# Patient Record
Sex: Male | Born: 1935 | Race: White | Hispanic: No | Marital: Married | Smoking: Never smoker
Health system: Southern US, Community
[De-identification: ages and names within clinical notes are randomized; demographics above are authoritative.]

## PROBLEM LIST (undated history)

## (undated) DIAGNOSIS — R112 Nausea with vomiting, unspecified: Secondary | ICD-10-CM

## (undated) DIAGNOSIS — R001 Bradycardia, unspecified: Secondary | ICD-10-CM

## (undated) DIAGNOSIS — I219 Acute myocardial infarction, unspecified: Secondary | ICD-10-CM

## (undated) DIAGNOSIS — I2510 Atherosclerotic heart disease of native coronary artery without angina pectoris: Secondary | ICD-10-CM

## (undated) DIAGNOSIS — Z9889 Other specified postprocedural states: Secondary | ICD-10-CM

## (undated) DIAGNOSIS — Z9289 Personal history of other medical treatment: Secondary | ICD-10-CM

## (undated) DIAGNOSIS — I10 Essential (primary) hypertension: Secondary | ICD-10-CM

## (undated) DIAGNOSIS — Z87442 Personal history of urinary calculi: Secondary | ICD-10-CM

## (undated) DIAGNOSIS — I441 Atrioventricular block, second degree: Secondary | ICD-10-CM

## (undated) DIAGNOSIS — K219 Gastro-esophageal reflux disease without esophagitis: Secondary | ICD-10-CM

## (undated) DIAGNOSIS — G4730 Sleep apnea, unspecified: Secondary | ICD-10-CM

## (undated) DIAGNOSIS — H3530 Unspecified macular degeneration: Secondary | ICD-10-CM

## (undated) DIAGNOSIS — M1990 Unspecified osteoarthritis, unspecified site: Secondary | ICD-10-CM

## (undated) HISTORY — DX: Personal history of other medical treatment: Z92.89

## (undated) HISTORY — PX: OTHER SURGICAL HISTORY: SHX169

## (undated) HISTORY — PX: EYE SURGERY: SHX253

## (undated) HISTORY — PX: CORONARY ANGIOPLASTY: SHX604

## (undated) HISTORY — PX: KNEE SURGERY: SHX244

---

## 1998-05-18 DIAGNOSIS — I219 Acute myocardial infarction, unspecified: Secondary | ICD-10-CM

## 1998-05-18 HISTORY — DX: Acute myocardial infarction, unspecified: I21.9

## 1998-06-14 ENCOUNTER — Encounter: Payer: Self-pay | Admitting: Obstetrics and Gynecology

## 1998-06-14 ENCOUNTER — Emergency Department (HOSPITAL_COMMUNITY): Admission: EM | Admit: 1998-06-14 | Discharge: 1998-06-14 | Payer: Self-pay | Admitting: *Deleted

## 1999-02-17 ENCOUNTER — Ambulatory Visit (HOSPITAL_COMMUNITY): Admission: RE | Admit: 1999-02-17 | Discharge: 1999-02-18 | Payer: Self-pay | Admitting: Cardiology

## 1999-02-23 ENCOUNTER — Inpatient Hospital Stay (HOSPITAL_COMMUNITY): Admission: EM | Admit: 1999-02-23 | Discharge: 1999-02-25 | Payer: Self-pay | Admitting: Emergency Medicine

## 1999-02-23 ENCOUNTER — Encounter: Payer: Self-pay | Admitting: Cardiology

## 1999-03-05 ENCOUNTER — Encounter: Payer: Self-pay | Admitting: *Deleted

## 1999-03-06 ENCOUNTER — Inpatient Hospital Stay (HOSPITAL_COMMUNITY): Admission: EM | Admit: 1999-03-06 | Discharge: 1999-03-06 | Payer: Self-pay | Admitting: Emergency Medicine

## 1999-05-01 ENCOUNTER — Encounter: Payer: Self-pay | Admitting: Nephrology

## 1999-05-01 ENCOUNTER — Encounter: Admission: RE | Admit: 1999-05-01 | Discharge: 1999-05-01 | Payer: Self-pay | Admitting: Nephrology

## 1999-05-15 ENCOUNTER — Ambulatory Visit (HOSPITAL_COMMUNITY): Admission: RE | Admit: 1999-05-15 | Discharge: 1999-05-15 | Payer: Self-pay | Admitting: Cardiology

## 1999-08-12 ENCOUNTER — Ambulatory Visit (HOSPITAL_COMMUNITY): Admission: RE | Admit: 1999-08-12 | Discharge: 1999-08-12 | Payer: Self-pay | Admitting: Cardiology

## 2001-01-10 ENCOUNTER — Encounter: Payer: Self-pay | Admitting: Family Medicine

## 2001-01-10 ENCOUNTER — Ambulatory Visit (HOSPITAL_COMMUNITY): Admission: RE | Admit: 2001-01-10 | Discharge: 2001-01-10 | Payer: Self-pay | Admitting: Family Medicine

## 2001-04-07 ENCOUNTER — Ambulatory Visit (HOSPITAL_COMMUNITY): Admission: RE | Admit: 2001-04-07 | Discharge: 2001-04-07 | Payer: Self-pay | Admitting: Cardiology

## 2001-11-25 ENCOUNTER — Encounter: Payer: Self-pay | Admitting: Cardiology

## 2001-11-25 ENCOUNTER — Observation Stay (HOSPITAL_COMMUNITY): Admission: EM | Admit: 2001-11-25 | Discharge: 2001-11-25 | Payer: Self-pay | Admitting: Emergency Medicine

## 2004-10-06 ENCOUNTER — Emergency Department (HOSPITAL_COMMUNITY): Admission: EM | Admit: 2004-10-06 | Discharge: 2004-10-06 | Payer: Self-pay | Admitting: Emergency Medicine

## 2007-05-24 ENCOUNTER — Emergency Department (HOSPITAL_COMMUNITY): Admission: EM | Admit: 2007-05-24 | Discharge: 2007-05-24 | Payer: Self-pay | Admitting: Emergency Medicine

## 2007-10-14 ENCOUNTER — Emergency Department (HOSPITAL_COMMUNITY): Admission: EM | Admit: 2007-10-14 | Discharge: 2007-10-14 | Payer: Self-pay | Admitting: Emergency Medicine

## 2008-05-17 ENCOUNTER — Emergency Department (HOSPITAL_BASED_OUTPATIENT_CLINIC_OR_DEPARTMENT_OTHER): Admission: EM | Admit: 2008-05-17 | Discharge: 2008-05-17 | Payer: Self-pay | Admitting: Emergency Medicine

## 2009-04-29 ENCOUNTER — Inpatient Hospital Stay (HOSPITAL_COMMUNITY): Admission: EM | Admit: 2009-04-29 | Discharge: 2009-05-01 | Payer: Self-pay | Admitting: Emergency Medicine

## 2009-04-30 ENCOUNTER — Encounter (INDEPENDENT_AMBULATORY_CARE_PROVIDER_SITE_OTHER): Payer: Self-pay | Admitting: Internal Medicine

## 2009-04-30 ENCOUNTER — Ambulatory Visit: Payer: Self-pay | Admitting: Vascular Surgery

## 2010-08-19 LAB — DIFFERENTIAL
Lymphocytes Relative: 11 % — ABNORMAL LOW (ref 12–46)
Lymphs Abs: 0.8 10*3/uL (ref 0.7–4.0)
Monocytes Absolute: 0.3 10*3/uL (ref 0.1–1.0)
Monocytes Relative: 4 % (ref 3–12)
Neutro Abs: 6.2 10*3/uL (ref 1.7–7.7)
Neutrophils Relative %: 83 % — ABNORMAL HIGH (ref 43–77)

## 2010-08-19 LAB — COMPREHENSIVE METABOLIC PANEL
AST: 27 U/L (ref 0–37)
Alkaline Phosphatase: 75 U/L (ref 39–117)
CO2: 24 mEq/L (ref 19–32)
Calcium: 8.8 mg/dL (ref 8.4–10.5)
Chloride: 105 mEq/L (ref 96–112)
GFR calc Af Amer: >60 mL/min (ref >60)
Glucose, Bld: 118 mg/dL — ABNORMAL HIGH (ref 70–99)
Potassium: 3.6 mEq/L (ref 3.5–5.1)
Sodium: 137 mEq/L (ref 135–145)

## 2010-08-19 LAB — LIPID PANEL
Total CHOL/HDL Ratio: 3.1 RATIO
VLDL: 14 mg/dL (ref 0–40)

## 2010-08-19 LAB — GLUCOSE, CAPILLARY: Glucose-Capillary: 109 mg/dL — ABNORMAL HIGH (ref 70–99)

## 2010-08-19 LAB — BASIC METABOLIC PANEL
BUN: 16 mg/dL (ref 6–23)
CO2: 24 mEq/L (ref 19–32)
Calcium: 8.9 mg/dL (ref 8.4–10.5)
Creatinine, Ser: 1.49 mg/dL (ref 0.4–1.5)
GFR calc non Af Amer: 46 mL/min — ABNORMAL LOW (ref >60)
Glucose, Bld: 99 mg/dL (ref 70–99)
Potassium: 4 mEq/L (ref 3.5–5.1)

## 2010-08-19 LAB — CARDIAC PANEL(CRET KIN+CKTOT+MB+TROPI)
Relative Index: 1.7 (ref 0.0–2.5)
Relative Index: 1.7 (ref 0.0–2.5)
Total CK: 137 U/L (ref 7–232)
Total CK: 139 U/L (ref 7–232)
Troponin I: 0.01 ng/mL (ref 0.00–0.06)
Troponin I: 0.01 ng/mL (ref 0.00–0.06)
Troponin I: 0.02 ng/mL (ref 0.00–0.06)

## 2010-08-19 LAB — CBC
MCHC: 34.8 g/dL (ref 30.0–36.0)
MCV: 94.8 fL (ref 78.0–100.0)
RDW: 13.8 % (ref 11.5–15.5)

## 2010-08-19 LAB — PROTIME-INR
INR: 1 (ref 0.00–1.49)
Prothrombin Time: 13.1 seconds (ref 11.6–15.2)

## 2010-10-03 NOTE — Cardiovascular Report (Signed)
[place]. [facility]  Patient:    Bruce [name], Bruce [name] Visit Number: [id number] MRN: [id number]          Service Type: MED Location: 2000 2002 01 Attending Physician:  Corliss Marcus Dictated by:   Meade Maw, M.D. Admit Date:  11/25/2001 Discharge Date: 11/25/2001   CC:         [name]. [name], M.D.  [name], M.D.   Cardiac Catheterization  REFERRING PHYSICIAN:  [name]. [name], M.D.  BRIEF HISTORY:  The patient is a 75 year old gentleman who has known coronary artery disease status post PTCA with a stent deployed in the right coronary artery.  He presents to the emergency room with a history suggestive of unstable angina.  DESCRIPTION OF PROCEDURE:  After obtaining written informed consent and premedicating the patient with Mucomyst for mild renal insufficiency, the patient was brought to the cardiac catheterization lab in the postabsorptive state.  The right groin was prepped and draped in the usual sterile fashion. Local anesthesia was achieved using 1% Xylocaine.  A #6 French hemostasis sheath was placed into the right femoral artery using the modified Seldinger technique.  Selective coronary angiography was performed using a JL4 and a No-Torque right.  Selective engagement with a JR4 was made difficult by inadvertent engagement of the conus branch.  Left heart pressures were obtained using a #6 French pigtail curved catheter.  Ventriculogram was deferred secondary to mild elevation of the creatinine.  All catheter exchanges were made over a guidewire.  The hemostasis sheath was placed following each engagement.  FINDINGS:  HEMODYNAMIC DATA: 1. The aortic pressure was 126/78. 2. LV pressure was 126/19.  CORONARY ANGIOGRAPHY:  Fluoroscopy did not reveal any significant calcification. 1. Left main coronary artery:  The left main coronary artery bifurcates into    the left anterior descending and circumflex vessels.  The left main    coronary artery appears normal.  2. Left anterior descending:  The left anterior descending and its branches    were mildly diseased in the mid portion of the LAD.  This region has    previously had ultrasound.  There was a 30-40% tubular stenosis in the    origin of a large diagonal branch.  The ongoing LAD is narrow in caliber    and transverses the apex.  3. Circumflex vessel:  The circumflex vessel and its branches were without    significant obstruction.  There were two small marginal branches which    arise proximally and a true obtuse marginal branch without significant    obstruction.  4. The right coronary artery and its branches show that the mid vessel stented    segment is widely patent.  There is a 10-15% tubular stenosis throughout    the stent.  The ongoing right coronary artery is dominant and without    significant obstruction.  IMPRESSION: 1. Nonobstructive coronary artery disease in the left anterior descending. 2. Widely patent stent in the right coronary artery. 3. Normal left ventricular function.  The patient was transferred to the holding area.  Dr. Amil Amen will review the films prior to discontinuation of the sheath.Dictated by:   Meade Maw, M.D.  Attending Physician:  Corliss Marcus DD:  11/25/01 TD:  11/28/01 Job: [id number]

## 2010-10-03 NOTE — H&P (Signed)
[place]. [facility]  Patient:    Bruce [name], MCCAULEY Visit Number: [id number] MRN: [id number]          Service Type: MED Location: 2000 2002 01 Attending Physician:  Corliss Marcus Dictated by:   Anselm Lis, N.P. Admit Date:  11/25/2001 Discharge Date: 11/25/2001   CC:         Dellis Anes. Idell Pickles, M.D.   History and Physical  DATE OF BIRTH:  February 08, 1936  PRIMARY CARE [place] [facility]:  Dr. Dellis Anes. Heller.  IMPRESSION: 1. Chest tightness in this 75 year old male with a history of silent coronary    artery disease; single-vessel coronary atherosclerotic heart disease.  He    is status post stent to right coronary artery, October 2001.  Followup    heart catheterization, March 2002, which was done secondary to abnormal    Cardiolite, and cardiac catheterization, November 2002, (filling mandates    of Reversal Study) revealed patent stent; nonobstructive left anterior    descending disease.  The patient is currently pain-free.  Electrocardiogram    is without acute ischemic changes and without significant change from    baseline.  Question anginal equivalent versus gastroesophageal reflux    disease.  The patient did take some Tums which seemed to help with episodes    of tightness two days earlier. 2. Dyslipidemia. 3. Hypertension. 4. History of renal insufficiency.  PLAN: 1. Pending Dr. [name] review, admit to telemetry, serial cardiac    enzymes and daily EKG. 2. Continue medications as prior to admission with the addition of subcut.    Lovenox.  HISTORY OF PRESENT ILLNESS:  The patient is a very pleasant 75 year old gentleman with a history of silent coronary ischemia; single-vessel CAD.  He is status post stent to RCA, October 2001, with followup cardiac catheterization, March 2002, revealing patent stent (done as followup to abnormal Cardiolite).  He is status post diagnostic cardiac catheterization, November 2002, as mandated by Reversal  Study, which revealed widely patent stent, RCA; nonobstructive disease, LAD.  His EF is "borderline low-normal.)  History of dyslipidemia, history of renal insufficiency with baseline creatinine 1.6, hypertension and positive family history of CAD.  Two days prior to admission, the patient noted mild anterior chest pressure, episodic, lasting one to two minutes each time.  No associated symptoms.  He later took Tums which seemed to help.  He is not sure if these symptoms were associated with activity.  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  This morning, while he was at a local hardwood store, he had recurrence of same discomfort with associated "feeling as if he had to take a deeper breath."  No other symptoms.  Symptoms relieved with rest, lasted about 15 minutes total.  He reported to Dr. Dellis Anes. Hellers office and during that period, noted across-posterior-shoulder discomfort.  He has had no further chest pressure.  EKG revealed sinus bradycardia and "old" inferior MI; T wave inversions, lead III; no significant change from last EKG, March 2001. He was transferred via EMS to [facility], where he remained pain-free.  PREVIOUS MEDICAL HISTORY: 1. Coronary atherosclerotic heart disease.    A. (November 2002) Diagnostic cardiac catheterization for filling       requirements of Reversal Trial.  Patent RCA stent.  Nonobstructive LAD       disease.    B. (March 2002) Cardiac catheterization revealing patent stent; this       catheterization was followup of  an abnormal Cardiolite.  EF 52%.       Negative MR. Inferobasal akinesis.    C. (October 2001) PTCA/stent, RCA; followup of an abnormal screening       Cardiolite. 2. History of dyslipidemia, on Lipitor management by Dr. Amil Amen. 3. History of hypertension. 4. History of mild renal insufficiency with serum creatinine as high as 1.8,    October of 2000, as well as 1.4, March of 2001.  Lab work,  November 2002,    revealed creatinine 1.6, BUN of 19.  He has had workup by    Dr. Dyke Maes of [facility], 10-May-1999   including a 24-hour urine for protein which was negative.  Negative SPEP.    Renal ultrasound okay except for one small cyst, right kidney.  ALLERGIES:  To PLAVIX, causing as rash; ALDACTONE and ALDOMET caused a rash and pruritus.  SOCIAL HISTORY AND HABITS:  He has been married for 39 years to [place].  He has two daughters, Octavio Graves and Misty Stanley, ages approximately 25 and 21, respectively. Tobacco:  Negative.  ETOH:  Negative.  Caffeine:  Not excessive.  No use of illicit drugs.  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  He takes frequent mission trips and previously been to [place], [place] and [place].  FAMILY HISTORY:  Sister recently deceased of an MI at age 84 05-09-00). He has a 12 year old brother and a 37 year old brother, both with hypertension and heart disease.  Another 35 year old sister doing well.  All siblings have elevated cholesterol.  REVIEW OF SYSTEMS:  As in HPI/previous medical history, otherwise mentions he has lightheadedness after bending and standing up quickly, which he attributes to his Cardura.  Wears upper partials.  Episodic buzzing in his right ear.  He has had prior syncopal episode occurring I believe close to his initial stent back in 2001; etiology was a severe upper thigh pain that was so bad it caused him to pass out.  Episodically, he will get bilateral thigh discomfort, last episode about three weeks earlier, which is quite excruciating to him; he has not mentioned this to his primary care and etiology is unknown.  Denies melena nor bright red blood PR.  Negative constipation nor diarrhea.  Negative dysuria nor hematuria.  Denies arthritic-type complaints.  Negative pedal edema, DOE, orthopnea nor PND.  Occasional GERD, unlike presenting symptoms.  PHYSICAL EXAMINATION:  VITAL SIGNS:   Blood pressure 120/70, heart rate 68 and regular, respiratory  rate 18, temperature 97.6.  GENERAL:  He is a well-nourished gentleman appearing approximately his stated age.  He looks fatigued.  No acute distress and currently discomfort-free. His wife and daughter Misty Stanley are in attendance.  HEENT/NECK:  Brisk bilateral carotid upstrokes without bruit.  He does have a right JVD, which is not apparent on the left.  CHEST:  Left basilar crackles on inspiration, otherwise, clear.  CARDIAC:  Regular rate and rhythm without murmur, rub nor gallop.  Normal S1 and S2.  ABDOMEN:  Soft, nondistended, normoactive bowel sounds.  Negative abdominal aortic, renal or femoral bruit.  Nontender to applied pressure.  No masses nor organomegaly appreciated.  EXTREMITIES:  Distal pulses intact.  Negative pedal edema.  NEUROLOGIC:  Cranial nerves II-XII grossly intact, alert and oriented x3.  GENITAL:  Deferred.  RECTAL:  Deferred.  LABORATORY TESTS AND DATA:  WBC of 3.7, hemoglobin 13.4, hematocrit of 39.7, platelets of 141,000.  Sodium 139, potassium of 4.5, chloride of 107, CO2 26, glucose 101,  BUN of 22, creatinine 1.5, calcium 8.6.  LFTs all within normal range.  CK/MB, troponin I and coagulations are pending.Dictated by:   Anselm Lis, N.P. Attending Physician:  Corliss Marcus DD:  11/25/01 TD:  11/28/01 Job: [id number]

## 2010-10-03 NOTE — Cardiovascular Report (Signed)
[place]. [facility]  Patient:    Bruce [name], Bruce [name]                        MRN: [id number] Proc. Date: 08/12/99 Adm. Date:  [id number] Disc. Date: [id number] Attending:  Corliss Marcus CC:         [name]. [name], M.D.             Cardiac Catheterization Laboratory                        Cardiac Catheterization  CINE NUMBER:  [id number]  PROCEDURES PERFORMED: 1. Left heart catheterization. 2. Coronary angiography. 3. Left ventriculogram. 4. Intravascular ultrasound of left anterior descending artery (REVERSAL). 5. Percutaneous closure, right femoral artery.  INDICATIONS:  Mr. Bose is a 75 year old man with six months status post percutaneous revascularization of the right coronary which was completely occluded. He has done well in the interim.  A routine screening six month myocardial perfusion centrigraphy with exercise suggested some reversible ischemia as well as a primarily fixed defect in the inferior wall.  He did not have symptoms during the exercise portion and has not had at any time.  He has returned to the catheterization laboratory at this time to rule out the possibility of silent restenosis.  DESCRIPTION OF PROCEDURE:  The patient was brought to the cardiac catheterization laboratory in the post-absorptive state.  The right groin was prepped and draped in the usual sterile fashion.  Local anesthesia was obtained with the infiltration of 1% lidocaine.  A 5 French catheter sheath was inserted percutaneously into the right femoral artery utilizing an anterior approach over a guiding J wire.  A 110 cm pigtail catheter was advanced to the ascending aorta where the pressure as recorded.  The catheter was then prolapsed across the aortic valve and the pressure again recorded both prior to and following the ventriculogram.  A 30 degree RAO  cine left ventriculogram was performed utilizing a power injector.  Omnipaque, 45 cc, was injected at 13 cc  per second.  Following the sublingual administration of 0.4 mg nitroglycerin, cine angiography of each coronary artery was conducted in LAO and RAO projections using left and right 5 French #4 Judkins catheters.  The patient had previously consented to enrollment in the REVERSAL study. There was modest disease in the LAD.  Preparations were then made to proceed with intravascular ultrasound.  The 5 French catheter sheath was exchanged over a long guiding J wire for a 7 French catheter sheath.  The patient received 3000 units of heparin intravenously.  A 7 French FL-4 Sci-Med Wiseguide guiding catheter was advanced to the ascending aorta where the left coronary os was engaged.  A 0.014 inch ACS high torque floppy guide wire was advanced across the 30 to 40% lesion in the mid portion of the LAD and into a large diagonal branch.  A Sci-Med Ultra Cross intravascular ultrasound catheter was advanced into the mid to distal portion of the LAD.  Two ultrasound runs were obtained using a mechanical pullback device.  The patient did receive 0.2 mg of intracoronary nitroglycerin prior to initiation of this procedure.  At the completion, a repeat cine angiogram confirmed no significant changes seen in the LAD following the intravascular ultrasound.  The guiding catheter was removed over a long guiding J wire.  The catheter sheath was removed and the arteriotomy site closed percutaneously using the Perclose closure device.  Patient was then removed to the catheterization table and transported to the recovery area in stable condition with good distal pulses.  Fluoroscopy time was 9.2 minutes and total contrast utilized was 190 cc of Omnipaque.  HEMODYNAMICS:  Systemic arterial pressure was 137/78 with a mean of 102 mmHg. There was no systolic gradient across the aortic valve.  Left ventricular end diastolic pressure was 20 mmHg pre ventriculogram and unchanged  post ventriculogram.  ANGIOGRAPHY:  The left ventriculogram demonstrated a normal left ventricular chamber size and normal global systolic function.  There was inferobasilar akinesis seen.  The calculated ejection fraction utilizing a single-plane cine method was 52%.  There was no coronary calcification seen.  There was no mitral regurgitation.  There was a right dominant coronary system present.  The left main coronary artery was normal.  The left anterior descending artery and its branches were minimally diseased; there were two diagonal branches which arose, the second of which was quite large and  competed in dominance with the outgoing LAD.  Just after the origin of the diagonal branch in the LAD, there was a diffuse 30% stenosis.  In the proximal portion of the large diagonal branch, there was another 30% stenosis and, in the mid portion of the LAD, there was an eccentric 30 to 35% stenosis which extended approximately 15 mm.  The left circumflex artery and its branches were minimally diseased; this vessel really amounted to a single large true obtuse marginal branch.  There were three very small circumflex marginal branches prior to this.  No significant obstructions were seen within this vessel.  The right coronary artery and its branches were moderately diseased; the vessel was widely patent.  There is a "shepherds crook" deformity in the proximal segment nd this is the location of a 30% stenosis.  The stented segment is in the mid portion of the vessel and is widely patent.  There is perhaps 10% in-stent stenosis seen. The ongoing vessel is quite large.  There is a 20% eccentric stenosis in the distal segment.  It then gives rise to a large posterior descending artery and a very large posterolateral segment and branch.  No obstructions are seen within this portion of the vessel.  The intravascular ultrasound documented the presence of disease in the  mid portion of the LAD but the lumen was at no time significantly compromised.  There were superficial calcification seen.  FINAL IMPRESSION: 1. Arteriosclerotic cardiovascular disease, single vessel.  2. Intact global left ventricular size and systolic function with regional wall    motion abnormality as noted. 3. Nonobstructive atherosclerotic coronary vascular disease.  PLAN:  The patient is presented with this gratifying news.  The right coronary remains wide opened.  Unfortunately, there has not been a significant amount of  recovery of function of the basal portion of the inferior wall.  However, his overall LV systolic function is normal and this result gives him an excellent prognosis.  The patient has been enrolled in the REVERSAL study and will have his Lipitors discontinued for one month after which his lipid panel will be obtained and, if his LDL is higher than 125, he will be randomized either with Lipitor 80 mg p.o. q.  day, or Pravachol 40 mg p.o. q.d. for 18 months.  He will then return to the catheterization laboratory for a repeat angiography and intravascular ultrasound. DD:  08/12/99 TD:  08/13/99 Job: [id number]

## 2010-10-03 NOTE — Cardiovascular Report (Signed)
[place]. [facility]  Patient:    [name], [name] Visit Number: [id number] MRN: [id number]          Service Type: Attending:  [name], M.D. Dictated by:   [name], M.D. Proc. Date: 04/07/01   CC:         [name]. [name], M.D.  [facility]  Cardiac Catheterization Lab   Cardiac Catheterization  PROCEDURES PERFORMED. 1. Left heart catheterization. 2. Coronary angiography. 3. Left ventriculogram. 4. Intravascular ultrasound of left anterior descending artery (REVERSAL). 5. Percutaneous closure of right femoral artery.  SURGEON:  [name], M.D.  INDICATIONS:  [name] is a 75 year old man who now returns to the outpatient center for his 25-month followup angiogram as mandated by the REVERSAL study.  He has done well in the meantime with no recurrent of angina pectoris.  He remains vigorous and active.  As part of this study, he will undergo cardiac catheterization, coronary angiography, left ventriculogram, and intravascular ultrasound to the left anterior descending artery where moderate stenoses were noted at his previous study August 12, 1999.  PROCEDURE NOTE:  Left heart catheterization was performed following the percutaneous insertion of 7-French catheter sheath utilizing an anterior approach over a guiding J wire into the right femoral artery.  A 110 cm pigtail catheter was used to measure pressures within the ascending aorta and in the left ventricle both prior to and following the ventriculogram.  A 30-degree RAO cine left ventriculogram was performed utilizing the power injector.  Coronary angiography was then performed using 6-French #4 left and right Judkins catheters.  Cine angiography of each coronary artery was conducted in multiple LAO and RAO projections.  At completion of the angiography, the patient underwent intravascular ultrasound.  A 7-French FL4 Scimed Wiseguide  guiding catheter was advanced in the ascending aorta where the left coronary os was engaged.  A 0.014 inch Scimed Luge intracoronary guidewire was passed into the distal second diagonal branch with a moderate amount of difficulty.  It was quite tortuous in the more distal segment.  The patient had already received 3000 units of heparin intravenously.  The patient then received 200 mcg of intracoronary nitroglycerin.  The Scimed Atlantis intravascular ultrasound catheter was advanced into the diagonal branch.  Two pullback runs into the proximal anterior descending artery were completed.  The patient did have chest discomfort and slight ST segment elevation.  It did appear there was some distal diagonal branch spasm.  He was treated with an additional 200 mcg of nitroglycerin.  He was also given 50 mcg of fentanyl.  At the completion of intravascular ultrasound, the ultrasound catheter and guidewire were removed. Repeat cineangiography was performed showing no change in the caliber of the vessel or any distal vessel derangement.  The guiding catheter was removed as well as the guiding sheath.  The right femoral artery puncture site was close d percutaneously with the Perclose system.  There was good hemostasis.  He was transported to the recovery area in stable condition with intact distal pulse.  FLUOROSCOPY TIME:  8.4 minutes.  TOTAL CONTRAST UTILIZED:  175 cc of Omnipaque. HEMODYNAMICS:  Systemic arterial pressure was 152/87 with a mean of 113 mmHg. There was no systolic gradient across the aortic valve.  The left ventricular end-diastolic pressure was 28 mmHg preventriculogram and 18 mmHg post ventriculogram.  ANGIOGRAPHY:  The left ventriculogram demonstrated inferobasilar akinesis and anterolateral hypokinesis.  There was minimal coronary calcification seen in the  left coronary.  There was a right coronary artery stent seen.  There was no mitral regurgitation.  The calculated  ejection fraction utilizing a single-plane cine method was ______ .  There was a right dominant coronary system present.  The main left coronary artery was normal.  The left anterior descending artery and its branches were mildly diseased; in the mid portion of the anterior descending artery just prior to a bifurcation into a large diagonal branch and the ongoing anterior descending, there was a 30% eccentric stenosis.  In the origin of the large diagonal branch, there was a 30 to 40% tubular stenosis.  The ongoing anterior descending artery is narrow in caliber and traverses the apex but shows no significant obstructive disease.  It is, in fact, smaller in caliber than the ongoing second diagonal branch.  The left circumflex coronary artery and its branches were without significant obstruction.  There were two small marginal branches which arise proximally and then a large true obtuse marginal branch without any significant obstruction.  The right coronary artery and its branches showed the mid vessel stented segment which is widely patent.  There is a 10 to 15% tubular stenosis through the stent.  The ongoing right coronary is large and without significant obstruction.  It bifurcates into the posterior descending artery which is without significant obstruction and the ongoing posterolateral segment which gives rise to one small and one large left ventricular branch.  Collateral vessels were not seen.  INTRAVASCULAR ULTRASOUND:  This demonstrated narrowing of the vessel lumen in the proximal diagonal branch and in the mid portion of the anterior descending, corresponding to the 30 and 40% stenoses mentioned above.  As this was a research study protocol, a more formal review of the ultrasound was not conducted.  FINAL IMPRESSION: 1. Atherosclerotic cardiovascular disease, single vessel.  2. Widely patent stent in the right coronary artery. 3. Nonobstructive disease in the left  anterior descending artery. 4. Borderline normal left ventricular systolic function, ejection fraction    ______ . Dictated by:   [name], M.D. Attending:  [name], M.D. DD:  04/07/01 TD:  04/08/01 Job: [id number]

## 2010-12-01 ENCOUNTER — Encounter: Payer: Self-pay | Admitting: *Deleted

## 2010-12-01 ENCOUNTER — Emergency Department (INDEPENDENT_AMBULATORY_CARE_PROVIDER_SITE_OTHER): Payer: Medicare Other

## 2010-12-01 ENCOUNTER — Emergency Department (HOSPITAL_BASED_OUTPATIENT_CLINIC_OR_DEPARTMENT_OTHER)
Admission: EM | Admit: 2010-12-01 | Discharge: 2010-12-01 | Disposition: A | Discharge status: Home / Self Care | Payer: Medicare Other | Attending: Emergency Medicine | Admitting: Emergency Medicine

## 2010-12-01 DIAGNOSIS — R110 Nausea: Secondary | ICD-10-CM

## 2010-12-01 DIAGNOSIS — R42 Dizziness and giddiness: Secondary | ICD-10-CM

## 2010-12-01 DIAGNOSIS — G319 Degenerative disease of nervous system, unspecified: Secondary | ICD-10-CM | POA: Insufficient documentation

## 2010-12-01 DIAGNOSIS — I252 Old myocardial infarction: Secondary | ICD-10-CM | POA: Insufficient documentation

## 2010-12-01 DIAGNOSIS — Z79899 Other long term (current) drug therapy: Secondary | ICD-10-CM | POA: Insufficient documentation

## 2010-12-01 DIAGNOSIS — H8110 Benign paroxysmal vertigo, unspecified ear: Secondary | ICD-10-CM

## 2010-12-01 HISTORY — DX: Atherosclerotic heart disease of native coronary artery without angina pectoris: I25.10

## 2010-12-01 HISTORY — DX: Essential (primary) hypertension: I10

## 2010-12-01 LAB — COMPREHENSIVE METABOLIC PANEL
ALT: 34 U/L (ref 0–53)
AST: 28 U/L (ref 0–37)
Albumin: 3.8 g/dL (ref 3.5–5.2)
CO2: 25 mEq/L (ref 19–32)
Calcium: 9 mg/dL (ref 8.4–10.5)
Chloride: 104 mEq/L (ref 96–112)
Creatinine, Ser: 1.2 mg/dL (ref 0.50–1.35)
GFR calc non Af Amer: 60 mL/min — ABNORMAL LOW (ref >60)
Sodium: 138 mEq/L (ref 135–145)
Total Bilirubin: 0.5 mg/dL (ref 0.3–1.2)

## 2010-12-01 LAB — TROPONIN I: Troponin I: <0.3 ng/mL (ref <0.30)

## 2010-12-01 LAB — CBC
Hemoglobin: 14.1 g/dL (ref 13.0–17.0)
MCH: 31.1 pg (ref 26.0–34.0)
Platelets: 115 10*3/uL — ABNORMAL LOW (ref 150–400)
RBC: 4.53 MIL/uL (ref 4.22–5.81)
WBC: 4.2 10*3/uL (ref 4.0–10.5)

## 2010-12-01 LAB — CK TOTAL AND CKMB (NOT AT ARMC)
CK, MB: 5.1 ng/mL — ABNORMAL HIGH (ref 0.3–4.0)
CK, MB: 5 ng/mL — ABNORMAL HIGH (ref 0.3–4.0)
Relative Index: 1.9 (ref 0.0–2.5)
Total CK: 299 U/L — ABNORMAL HIGH (ref 7–232)
Total CK: 263 U/L — ABNORMAL HIGH (ref 7–232)

## 2010-12-01 LAB — DIFFERENTIAL
Lymphocytes Relative: 36 % (ref 12–46)
Lymphs Abs: 1.5 10*3/uL (ref 0.7–4.0)
Monocytes Relative: 8 % (ref 3–12)
Neutrophils Relative %: 53 % (ref 43–77)

## 2010-12-01 MED ORDER — MECLIZINE HCL 25 MG PO TABS: 25.0000 mg | ORAL_TABLET | Freq: Three times a day (TID) | ORAL | Status: AC | PRN

## 2010-12-01 MED ORDER — SODIUM CHLORIDE 0.9 % IV BOLUS (SEPSIS)
500.0000 mL | Freq: Once | INTRAVENOUS | Status: AC
  Administered 2010-12-01: 1000 mL via INTRAVENOUS

## 2010-12-01 MED ORDER — MECLIZINE HCL 25 MG PO TABS
25.0000 mg | ORAL_TABLET | Freq: Once | ORAL | Status: AC
  Administered 2010-12-01: 25 mg via ORAL
  Filled 2010-12-01: qty 1

## 2010-12-01 MED ORDER — ONDANSETRON HCL 4 MG/2ML IJ SOLN
4.0000 mg | Freq: Once | INTRAMUSCULAR | Status: AC
  Administered 2010-12-01: 4 mg via INTRAVENOUS
  Filled 2010-12-01 (×2): qty 2

## 2010-12-01 NOTE — ED Provider Notes (Addendum)
History     Chief Complaint  Patient presents with  . Dizziness   The history is provided by the patient.  Patient states he is dizzy which he describes as things moving.  The symptoms are worse with standing.  They began this a.m.  He has had similar episodes of vertigo in the past.  He denies any pain including head, neck, chest, or abdomen.  The symptoms are severe.    The symptoms are improved by being still.  No visual abnormalities. Past Medical History  Diagnosis Date  . Coronary artery disease   . Hypertension     History reviewed. No pertinent past surgical history.  History reviewed. No pertinent family history.  History  Substance Use Topics  . Smoking status: Never Smoker   . Smokeless tobacco: Never Used  . Alcohol Use: No      Review of Systems  All other systems reviewed and are negative.    Physical Exam  BP 147/79  Pulse 52  Temp(Src) 98.4 F (36.9 C) (Oral)  Resp 18  SpO2 100%  Physical Exam  ED Course  Procedures  MDM Patient feel much better.  All labs reviewed and abnormal ck/mb noted.  Troponin normal and ekg without acute changes.  Symptoms c.w. Vertigo and improved with antivert.  Plan recheck cardiac markers and ambulation.    Date: 12/01/2010  Rate: 51  Rhythm: sinus bradycardia with occasional pvc  QRS Axis: normal  Intervals: normal  ST/T Wave abnormalities: normal  Conduction Disutrbances:none  Narrative Interpretation:   Old EKG Reviewed: unchanged    Hilario Quarry, MD 12/01/10 1225  Hilario Quarry, MD 12/01/10 1443

## 2010-12-01 NOTE — ED Notes (Signed)
Pt continues to rest without complaint of pain pt and family aware they are waiting for results of second cardiac marker

## 2010-12-01 NOTE — ED Notes (Signed)
Pt states he does feel better than he did when he came in states the dizziness has gotten some better continues to deny pain in chest

## 2010-12-01 NOTE — ED Notes (Signed)
Pt ambulatory to restroom with assistance tolerated well feels much better denies any dizziness pt noted to walk with a steady gait

## 2010-12-01 NOTE — ED Notes (Signed)
Patient is resting comfortably. 

## 2010-12-01 NOTE — ED Notes (Signed)
Labs returned MD will be in to speak with pt regarding his results

## 2010-12-01 NOTE — ED Notes (Signed)
Family at bedside. 

## 2010-12-01 NOTE — ED Notes (Signed)
Pt states that when he woke up up this morning he was dizzy and felt sick denies any pain continues to describe as "just sick" no weakness noted. Pt reports he has recently had a nuclear stress test that revealed he has had "several silent heart attacks" that he was unaware of. Pt also states that in 2000 he had a stress test and subsequently had stents placed.

## 2010-12-01 NOTE — ED Notes (Signed)
Patient denies pain and is resting comfortably. Explained to pt and family that we are still waiting for final labs to be resulted

## 2011-02-11 LAB — POCT I-STAT, CHEM 8
HCT: 44
Hemoglobin: 15
Potassium: 4.2
Sodium: 140
TCO2: 24

## 2011-04-20 ENCOUNTER — Encounter (INDEPENDENT_AMBULATORY_CARE_PROVIDER_SITE_OTHER): Payer: Medicare Other | Admitting: Ophthalmology

## 2011-04-20 DIAGNOSIS — H43819 Vitreous degeneration, unspecified eye: Secondary | ICD-10-CM

## 2011-04-20 DIAGNOSIS — H33309 Unspecified retinal break, unspecified eye: Secondary | ICD-10-CM

## 2011-04-20 DIAGNOSIS — H3530 Unspecified macular degeneration: Secondary | ICD-10-CM

## 2011-04-20 DIAGNOSIS — H35379 Puckering of macula, unspecified eye: Secondary | ICD-10-CM

## 2011-06-30 ENCOUNTER — Other Ambulatory Visit: Payer: Self-pay | Admitting: Cardiology

## 2011-07-01 ENCOUNTER — Ambulatory Visit (HOSPITAL_COMMUNITY)
Admission: RE | Admit: 2011-07-01 | Discharge: 2011-07-01 | Disposition: A | Discharge status: Home / Self Care | Payer: Medicare Other | Source: Ambulatory Visit | Attending: Cardiology | Admitting: Cardiology

## 2011-07-01 ENCOUNTER — Encounter (HOSPITAL_COMMUNITY)
Admission: RE | Disposition: A | Discharge status: Home / Self Care | Payer: Self-pay | Source: Ambulatory Visit | Attending: Cardiology

## 2011-07-01 ENCOUNTER — Other Ambulatory Visit: Payer: Self-pay

## 2011-07-01 DIAGNOSIS — I2510 Atherosclerotic heart disease of native coronary artery without angina pectoris: Secondary | ICD-10-CM | POA: Insufficient documentation

## 2011-07-01 DIAGNOSIS — Z9861 Coronary angioplasty status: Secondary | ICD-10-CM | POA: Insufficient documentation

## 2011-07-01 HISTORY — PX: LEFT HEART CATHETERIZATION WITH CORONARY ANGIOGRAM: SHX5451

## 2011-07-01 SURGERY — LEFT HEART CATHETERIZATION WITH CORONARY ANGIOGRAM
Anesthesia: LOCAL

## 2011-07-01 MED ORDER — LIDOCAINE HCL (PF) 1 % IJ SOLN
INTRAMUSCULAR | Status: AC
  Filled 2011-07-01: qty 30

## 2011-07-01 MED ORDER — NITROGLYCERIN 0.4 MG/SPRAY TL SOLN
1.0000 | Status: DC | PRN
  Administered 2011-07-01 (×2): 1 via SUBLINGUAL

## 2011-07-01 MED ORDER — SODIUM CHLORIDE 0.9 % IJ SOLN: 3.0000 mL | INTRAMUSCULAR | Status: DC | PRN

## 2011-07-01 MED ORDER — DIAZEPAM 5 MG PO TABS
ORAL_TABLET | ORAL | Status: AC
  Administered 2011-07-01: 5 mg via ORAL
  Filled 2011-07-01: qty 1

## 2011-07-01 MED ORDER — ASPIRIN 81 MG PO CHEW
324.0000 mg | CHEWABLE_TABLET | ORAL | Status: AC
  Administered 2011-07-01: 324 mg via ORAL

## 2011-07-01 MED ORDER — SODIUM CHLORIDE 0.9 % IV SOLN
INTRAVENOUS | Status: DC
  Administered 2011-07-01: 1000 mL via INTRAVENOUS

## 2011-07-01 MED ORDER — VERAPAMIL HCL 2.5 MG/ML IV SOLN
INTRAVENOUS | Status: AC
  Filled 2011-07-01: qty 2

## 2011-07-01 MED ORDER — ACETAMINOPHEN 325 MG PO TABS: 650.0000 mg | ORAL_TABLET | ORAL | Status: DC | PRN

## 2011-07-01 MED ORDER — NITROGLYCERIN 0.2 MG/ML ON CALL CATH LAB
INTRAVENOUS | Status: AC
  Filled 2011-07-01: qty 1

## 2011-07-01 MED ORDER — DIAZEPAM 5 MG PO TABS
5.0000 mg | ORAL_TABLET | ORAL | Status: AC
  Administered 2011-07-01: 5 mg via ORAL

## 2011-07-01 MED ORDER — SODIUM CHLORIDE 0.9 % IV SOLN: 250.0000 mL | INTRAVENOUS | Status: DC | PRN

## 2011-07-01 MED ORDER — SODIUM CHLORIDE 0.9 % IJ SOLN: 3.0000 mL | Freq: Two times a day (BID) | INTRAMUSCULAR | Status: DC

## 2011-07-01 MED ORDER — FENTANYL CITRATE 0.05 MG/ML IJ SOLN
INTRAMUSCULAR | Status: AC
  Filled 2011-07-01: qty 2

## 2011-07-01 MED ORDER — SODIUM CHLORIDE 0.9 % IV SOLN: 1.0000 mL/kg/h | INTRAVENOUS | Status: DC

## 2011-07-01 MED ORDER — HEPARIN (PORCINE) IN NACL 2-0.9 UNIT/ML-% IJ SOLN
INTRAMUSCULAR | Status: AC
  Filled 2011-07-01: qty 2000

## 2011-07-01 MED ORDER — ONDANSETRON HCL 4 MG/2ML IJ SOLN: 4.0000 mg | Freq: Four times a day (QID) | INTRAMUSCULAR | Status: DC | PRN

## 2011-07-01 MED ORDER — HEPARIN SODIUM (PORCINE) 1000 UNIT/ML IJ SOLN
INTRAMUSCULAR | Status: AC
  Filled 2011-07-01: qty 1

## 2011-07-01 MED ORDER — ASPIRIN 81 MG PO CHEW
CHEWABLE_TABLET | ORAL | Status: AC
  Filled 2011-07-01: qty 4

## 2011-07-01 MED ORDER — MIDAZOLAM HCL 2 MG/2ML IJ SOLN
INTRAMUSCULAR | Status: AC
  Filled 2011-07-01: qty 2

## 2011-07-01 NOTE — Interval H&P Note (Signed)
History and Physical Interval Note:  07/01/2011 10:54 AM  Bruce [name]  has presented today for surgery, with the diagnosis of R/O CAD  The various methods of treatment have been discussed with the patient and family. After consideration of risks, benefits and other options for treatment, the patient has consented to  Procedure(s) (LRB): LEFT HEART CATHETERIZATION WITH CORONARY ANGIOGRAM (N/A) as a surgical intervention .  The patients' history has been reviewed, patient examined, no change in status, stable for surgery.  I have reviewed the patients' chart and labs.  Questions were answered to the patient's satisfaction.     [name]

## 2011-07-01 NOTE — H&P (Signed)
  Please see office note for full details but briefly 76 year old patient with double vessel coronary artery disease who has a stent in the RCA from 2002, mild to moderate LAD stenosis a 40% diffuse proximal and mid who underwent a stress test showing mild peri-infarct ischemia in the basal inferior and mid inferior region. Normal EF. He's been battling with vertigo. He is still having pain across his chest moderate in intensity with radiation to his jaw. Because of this he is going forward with cardiac catheterization. Physical exam has been done and was unremarkable. We will proceed with cardiac catheterization. Once again please see office note for full details. His creatinine is in the 1.3-1.4 range. Risks and benefits have been discussed.

## 2011-07-01 NOTE — Discharge Instructions (Signed)
Groin Site Care Refer to this sheet in the next few weeks. These instructions provide you with information on caring for yourself after your procedure. Your caregiver may also give you more specific instructions. Your treatment has been planned according to current medical practices, but problems sometimes occur. Call your caregiver if you have any problems or questions after your procedure. HOME CARE INSTRUCTIONS  You may shower 24 hours after the procedure. Remove the bandage (dressing) and gently wash the site with plain soap and water. Gently pat the site dry.   Do not apply powder or lotion to the site.   Do not sit in a bathtub, swimming pool, or whirlpool for 5 to 7 days.   No bending, squatting, or lifting anything over 10 pounds (4.5 kg) as directed by your caregiver.   Inspect the site at least twice daily.   Do not drive home if you are discharged the same day of the procedure. Have someone else drive you.   You may drive 24 hours after the procedure unless otherwise instructed by your caregiver.  What to expect:  Any bruising will usually fade within 1 to 2 weeks.   Blood that collects in the tissue (hematoma) may be painful to the touch. It should usually decrease in size and tenderness within 1 to 2 weeks.  SEEK IMMEDIATE MEDICAL CARE IF:  You have unusual pain at the groin site or down the affected leg.   You have redness, warmth, swelling, or pain at the groin site.   You have drainage (other than a small amount of blood on the dressing).   You have chills.   You have a fever or persistent symptoms for more than 72 hours.   You have a fever and your symptoms suddenly get worse.   Your leg becomes pale, cool, tingly, or numb.   You have heavy bleeding from the site. Hold pressure on the site.  Document Released: 06/06/2010 Document Revised: 01/14/2011 Document Reviewed: 06/06/2010 Exit[employer] Patient Information 2012 [employer].   Radial Site Care Refer to  this sheet in the next few weeks. These instructions provide you with information on caring for yourself after your procedure. Your caregiver may also give you more specific instructions. Your treatment has been planned according to current medical practices, but problems sometimes occur. Call your caregiver if you have any problems or questions after your procedure. HOME CARE INSTRUCTIONS  You may shower the day after the procedure.Remove the bandage (dressing) and gently wash the site with plain soap and water.Gently pat the site dry.   Do not apply powder or lotion to the site.   Do not submerge the affected site in water for 3 to 5 days.   Inspect the site at least twice daily.   Do not flex or bend the affected arm for 24 hours.   No lifting over 5 pounds (2.3 kg) for 5 days after your procedure.   Do not drive home if you are discharged the same day of the procedure. Have someone else drive you.   You may drive 24 hours after the procedure unless otherwise instructed by your caregiver.  What to expect:  Any bruising will usually fade within 1 to 2 weeks.   Blood that collects in the tissue (hematoma) may be painful to the touch. It should usually decrease in size and tenderness within 1 to 2 weeks.  SEEK IMMEDIATE MEDICAL CARE IF:  You have unusual pain at the radial site.   You   have redness, warmth, swelling, or pain at the radial site.   You have drainage (other than a small amount of blood on the dressing).   You have chills.   You have a fever or persistent symptoms for more than 72 hours.   You have a fever and your symptoms suddenly get worse.   Your arm becomes pale, cool, tingly, or numb.   You have heavy bleeding from the site. Hold pressure on the site.  Document Released: 06/06/2010 Document Revised: 01/14/2011 Document Reviewed: 06/06/2010 [employer] Patient Information 2012 [employer]. 

## 2011-07-01 NOTE — Op Note (Signed)
PROCEDURE:  Left heart catheterization with selective coronary angiography, left ventriculogram.  INDICATIONS:  76 year old with former right coronary artery stent and inferior infarct with peri-infarct ischemia on nuclear stress test with chest discomfort radiating to his jaw.  The risks, benefits, and details of the procedure were explained to the patient.  The patient verbalized understanding and wanted to proceed.  Informed written consent was obtained.  PROCEDURE TECHNIQUE: I had originally attempted to pursue radial artery approach however when the wire was advanced approximately 10 cm there was resistance. This was demonstrated under fluoroscopy. Because of the resistance the radial artery approach was abandoned. Pressure was held on the radial artery without any hematoma development. After Xylocaine anesthesia and visualization of the femoral head via fluoroscopy, a 4F sheath was placed in the right femoral artery with a single anterior needle wall stick.   Left coronary angiography was done using a Judkins L4 catheter.  Right coronary angiography was done using a Judkins R4 catheter.  Left ventriculography was done using a pigtail catheter.    CONTRAST:  Total of 50 ml.  FLOUROSCOPY TIME: 2.4 minutes.   COMPLICATIONS:  None.    HEMODYNAMICS:  Aortic pressure was 138/83/106 mmHg; LV systolic pressure was 138 mmHg; LVEDP 16 mmHg.  There was no gradient between the left ventricle and aorta.    ANGIOGRAPHIC DATA:    Left main: No angiographically significant disease  Left anterior descending (LAD): Minor 30-40% mid LAD after a first small septal branch which appeared to be I this in the previous angiogram. Tortuous vessel. Large diagonal branch.  Circumflex artery (CIRC): 1 large obtuse marginal. Tortuous. No angiographically significant disease  Right coronary artery (RCA): Large bend in possible right coronary artery. The previously placed stent in the proximal section/midsection has  only 10-20% in-stent stenosis. Overall excellent. PDA and posterior lateral branch are only minor luminal irregularities.  LEFT VENTRICULOGRAM:  Left ventricular angiogram was done in the 30 RAO projection and revealed basal inferior wall akinesis consistent with prior infarct as seen on nuclear stress test. Systolic function with an estimated ejection fraction of 50% %.   IMPRESSIONS:  1. Previously placed RCA stent with only 10% in-stent stenosis, overall excellent. Prior LAD with 30% stenosis at large diagonal branch bifurcation.  2. Low normal ejection fraction with inferior basal wall akinesis. EF 50%.    RECOMMENDATION:  Continue with medical management. Excellent angiographic result.Marland Kitchen

## 2012-04-19 ENCOUNTER — Ambulatory Visit (INDEPENDENT_AMBULATORY_CARE_PROVIDER_SITE_OTHER): Payer: Medicare Other | Admitting: Ophthalmology

## 2012-05-05 ENCOUNTER — Ambulatory Visit (INDEPENDENT_AMBULATORY_CARE_PROVIDER_SITE_OTHER): Payer: Medicare Other | Admitting: Ophthalmology

## 2012-05-05 DIAGNOSIS — H35379 Puckering of macula, unspecified eye: Secondary | ICD-10-CM

## 2012-05-05 DIAGNOSIS — H3530 Unspecified macular degeneration: Secondary | ICD-10-CM

## 2012-05-05 DIAGNOSIS — H33309 Unspecified retinal break, unspecified eye: Secondary | ICD-10-CM

## 2012-05-05 DIAGNOSIS — H35039 Hypertensive retinopathy, unspecified eye: Secondary | ICD-10-CM

## 2012-05-05 DIAGNOSIS — I10 Essential (primary) hypertension: Secondary | ICD-10-CM

## 2012-05-05 DIAGNOSIS — H43819 Vitreous degeneration, unspecified eye: Secondary | ICD-10-CM

## 2012-10-03 ENCOUNTER — Encounter: Payer: Self-pay | Admitting: Emergency Medicine

## 2012-10-03 ENCOUNTER — Emergency Department
Admission: EM | Admit: 2012-10-03 | Discharge: 2012-10-03 | Disposition: A | Discharge status: Home / Self Care | Payer: Medicare Other | Source: Home / Self Care | Attending: Family Medicine | Admitting: Family Medicine

## 2012-10-03 DIAGNOSIS — S91009A Unspecified open wound, unspecified ankle, initial encounter: Secondary | ICD-10-CM

## 2012-10-03 DIAGNOSIS — S81009A Unspecified open wound, unspecified knee, initial encounter: Secondary | ICD-10-CM

## 2012-10-03 DIAGNOSIS — Z23 Encounter for immunization: Secondary | ICD-10-CM

## 2012-10-03 DIAGNOSIS — S81811A Laceration without foreign body, right lower leg, initial encounter: Secondary | ICD-10-CM

## 2012-10-03 MED ORDER — TETANUS-DIPHTH-ACELL PERTUSSIS 5-2.5-18.5 LF-MCG/0.5 IM SUSP
0.5000 mL | Freq: Once | INTRAMUSCULAR | Status: AC
  Administered 2012-10-03: 0.5 mL via INTRAMUSCULAR

## 2012-10-03 NOTE — ED Notes (Addendum)
2cm laceration to lower rt leg, today, while busting up a toilet.  Tetanus up-to-date

## 2012-10-03 NOTE — ED Provider Notes (Signed)
History     CSN: [id number]  Arrival date & time 10/03/12  1340   First MD Initiated Contact with Patient 10/03/12 1405      Chief Complaint  Patient presents with  . Extremity Laceration       HPI Comments: While breaking up a porcelain toilet today, a small chip cut his right anterior lower leg.  He is not sure when he had his last tetanus immunization.  Patient is a 77 y.o. male presenting with skin laceration. The history is provided by the patient.  Laceration Location:  Leg Leg laceration location:  L lower leg Length (cm):  1.5 Depth:  Through dermis Quality: straight   Bleeding: controlled   Time since incident:  1 hour Injury mechanism: broken porcelain. Pain details:    Quality:  Aching   Severity:  Mild   Progression:  Improving Foreign body present:  No foreign bodies Relieved by:  Nothing Tetanus status:  Unknown   Past Medical History  Diagnosis Date  . Coronary artery disease   . Hypertension     Past surgical history:  ORIF left wrist; coronary artery stent  No pertinent family history  History  Substance Use Topics  . Smoking status: Never Smoker   . Smokeless tobacco: Never Used  . Alcohol Use: No      Review of Systems  All other systems reviewed and are negative.    Allergies  Aldomet; Plavix; and Aldactone  Home Medications   Current Outpatient Rx  Name  Route  Sig  Dispense  Refill  . aspirin 81 MG chewable tablet   Oral   Chew 81 mg by mouth daily.           Marland Kitchen atorvastatin (LIPITOR) 80 MG tablet   Oral   Take 80 mg by mouth daily.           . beta carotene w/minerals (OCUVITE) tablet   Oral   Take 1 tablet by mouth daily.         Marland Kitchen doxazosin (CARDURA) 8 MG tablet   Oral   Take 8 mg by mouth at bedtime.           Marland Kitchen omeprazole (PRILOSEC) 40 MG capsule   Oral   Take 40 mg by mouth daily.         . quinapril (ACCUPRIL) 10 MG tablet   Oral   Take 10 mg by mouth at bedtime.             BP 124/77   Pulse 84  Temp(Src) 97.7 F (36.5 C) (Oral)  Ht 5\' 6"  (1.676 m)  Wt 181 lb (82.101 kg)  BMI 29.23 kg/m2  SpO2 95%  Physical Exam  Nursing note and vitals reviewed. Constitutional: He is oriented to person, place, and time. He appears well-developed and well-nourished. No distress.  HENT:  Head: Atraumatic.  Eyes: Conjunctivae are normal. Pupils are equal, round, and reactive to light.  Neurological: He is alert and oriented to person, place, and time.  Skin: Skin is warm and dry.     Over the pre-tibial area of the right lower leg is a simple 1.5cm long linear laceration through the dermis.  No swelling.  Distal neurovascular function is intact.     ED Course  Procedures  Laceration Repair Discussed benefits and risks of procedure and verbal consent obtained. Using sterile technique and local 1% lidocaine with epinephrine, cleansed wound with Betadine followed by copious lavage with normal saline.  Wound  carefully inspected for debris and foreign bodies; none found.  Wound closed with #5, 4-0 interrupted Prolene sutures.  Bacitracin and non-stick sterile dressing applied.  Wound precautions explained to patient.  Return for suture removal in 12 days.          1. Laceration of right lower leg, initial encounter       MDM   Change dressing daily and apply Bacitracin ointment to wound.  Keep wound clean and dry.  Return for any signs of infection (or follow-up with family doctor):  Increasing redness, swelling, pain, heat, drainage, etc. Return in 12 days for suture removal.        Lattie Haw, MD 10/03/12 [phone]

## 2012-10-26 ENCOUNTER — Emergency Department: Admission: EM | Admit: 2012-10-26 | Discharge: 2012-10-26 | Discharge status: Absent without leave | Payer: Self-pay

## 2013-05-05 ENCOUNTER — Ambulatory Visit (INDEPENDENT_AMBULATORY_CARE_PROVIDER_SITE_OTHER): Payer: Medicare PPO | Admitting: Ophthalmology

## 2013-05-05 DIAGNOSIS — I10 Essential (primary) hypertension: Secondary | ICD-10-CM

## 2013-05-05 DIAGNOSIS — H35379 Puckering of macula, unspecified eye: Secondary | ICD-10-CM

## 2013-05-05 DIAGNOSIS — H3530 Unspecified macular degeneration: Secondary | ICD-10-CM

## 2013-05-05 DIAGNOSIS — H35039 Hypertensive retinopathy, unspecified eye: Secondary | ICD-10-CM

## 2013-05-05 DIAGNOSIS — H43819 Vitreous degeneration, unspecified eye: Secondary | ICD-10-CM

## 2013-07-15 ENCOUNTER — Encounter: Payer: Self-pay | Admitting: *Deleted

## 2013-07-15 DIAGNOSIS — I2510 Atherosclerotic heart disease of native coronary artery without angina pectoris: Secondary | ICD-10-CM | POA: Insufficient documentation

## 2013-07-15 DIAGNOSIS — I10 Essential (primary) hypertension: Secondary | ICD-10-CM | POA: Insufficient documentation

## 2013-07-15 DIAGNOSIS — K279 Peptic ulcer, site unspecified, unspecified as acute or chronic, without hemorrhage or perforation: Secondary | ICD-10-CM | POA: Insufficient documentation

## 2013-07-15 DIAGNOSIS — E785 Hyperlipidemia, unspecified: Secondary | ICD-10-CM | POA: Insufficient documentation

## 2013-07-15 DIAGNOSIS — N529 Male erectile dysfunction, unspecified: Secondary | ICD-10-CM | POA: Insufficient documentation

## 2014-01-11 ENCOUNTER — Ambulatory Visit (INDEPENDENT_AMBULATORY_CARE_PROVIDER_SITE_OTHER): Payer: Commercial Managed Care - HMO | Admitting: Cardiology

## 2014-01-11 ENCOUNTER — Encounter: Payer: Self-pay | Admitting: Cardiology

## 2014-01-11 VITALS — BP 128/86 | HR 84 | Ht 66.0 in

## 2014-01-11 DIAGNOSIS — I77810 Thoracic aortic ectasia: Secondary | ICD-10-CM | POA: Insufficient documentation

## 2014-01-11 DIAGNOSIS — R42 Dizziness and giddiness: Secondary | ICD-10-CM | POA: Insufficient documentation

## 2014-01-11 DIAGNOSIS — I7090 Unspecified atherosclerosis: Secondary | ICD-10-CM

## 2014-01-11 DIAGNOSIS — I2510 Atherosclerotic heart disease of native coronary artery without angina pectoris: Secondary | ICD-10-CM

## 2014-01-11 DIAGNOSIS — I10 Essential (primary) hypertension: Secondary | ICD-10-CM | POA: Insufficient documentation

## 2014-01-11 NOTE — Patient Instructions (Signed)
The current medical regimen is effective;  continue present plan and medications.  Your physician has requested that you have an echocardiogram. Echocardiography is a painless test that uses sound waves to create images of your heart. It provides your doctor with information about the size and shape of your heart and how well your heart's chambers and valves are working. This procedure takes approximately one hour. There are no restrictions for this procedure.  Follow up in 1 year with Dr. Marlou Porch.  You will receive a letter in the mail 2 months before you are due.  Please call us when you receive this letter to schedule your follow up appointment.

## 2014-01-11 NOTE — Progress Notes (Signed)
[facility] [place] [facility]. [address], [place]  [address] Phone: [phone] Fax:  [phone]  Date:  01/11/2014   ID:  Bruce [name], DOB 06/05/1935, MRN [id number]  PCP:  No primary provider on file.   History of Present Illness: Bruce [name] is a 78 y.o. male male with hx double vessel coronary artery disease here for annual followup. Stent to the RCA in 2002, mild to moderate LAD stenosis 40% diffuse proximal and mid. Stress test in 2007 showed mild peri-infarct ischemia in the basal inferior and mid inferior region as well as basal inferolateral region. Normal EF. He was having anginal like chest pain and I proceeded with cardiac cath 2013 with noted patent stent and EF 50%. Overall he is doing well. No significant anginal symptoms, no chest pain. No changes. Compliant  He has been complaining of some dizziness, vertigo-like symptoms. He has had this in the past. He is starting to feel better. Room as spinning sensation.     Wt Readings from Last 3 Encounters:  10/03/12 181 lb (82.101 kg)  07/01/11 172 lb (78.019 kg)  07/01/11 172 lb (78.019 kg)     Past Medical History  Diagnosis Date  . Coronary artery disease   . Hypertension     No past surgical history on file.  Current Outpatient Prescriptions  Medication Sig Dispense Refill  . aspirin 81 MG chewable tablet Chew 81 mg by mouth daily.        Marland Kitchen atorvastatin (LIPITOR) 80 MG tablet Take 80 mg by mouth daily.        . beta carotene w/minerals (OCUVITE) tablet Take 1 tablet by mouth daily.      Marland Kitchen doxazosin (CARDURA) 8 MG tablet Take 8 mg by mouth at bedtime.        Marland Kitchen omeprazole (PRILOSEC) 40 MG capsule Take 40 mg by mouth daily.      . quinapril (ACCUPRIL) 10 MG tablet Take 10 mg by mouth at bedtime.         No current facility-administered medications for this visit.    Allergies:    Allergies  Allergen Reactions  . Aldomet [Methyldopa]   . Plavix [Clopidogrel Bisulfate]   . Aldactone [Spironolactone]  Rash    Social History:  The patient  reports that he has never smoked. He has never used smokeless tobacco. He reports that he does not drink alcohol or use illicit drugs.   Family History  Problem Relation Age of Onset  . Coronary artery disease Father   . Kidney disease Sister   . Hyperlipidemia Sister   . Heart disease Brother   . Diabetes Brother   . Coronary artery disease Brother   . Heart attack Sister     ROS:  Please see the history of present illness.   In general feels tired. Dr. Rex Kras has checked a TSH, normal. Denies any fevers, chills, orthopnea, PND   All other systems reviewed and negative.   PHYSICAL EXAM: VS:  BP 128/86  Pulse 84  Ht 5\' 6"  (1.676 m)  PF 175 L/min Well nourished, well developed, in no acute distress HEENT: normal, [place]/AT, EOMI Neck: no JVD, normal carotid upstroke, no bruit Cardiac:  normal S1, S2; RRR; no murmur Lungs:  clear to auscultation bilaterally, no wheezing, rhonchi or rales Abd: soft, nontender, no hepatomegaly, no bruits Ext: no edema, 2+ distal pulses Skin: warm and dry GU: deferred Neuro: no focal abnormalities noted, AAO x 3  EKG:  01/11/14-sinus rhythm, 84 with PVC, old inferior infarct pattern.     ASSESSMENT AND PLAN:  1. Dilated aortic root - 69mm 2010. Repeat echo. 2. Coronary artery disease-stable. No angina. 3. Hypertension-well-controlled. No changes made. ACE inhibitor. No beta blocker because of fatigue. 4. Hyperlipidemia-LDL 85/15-atorvastatin 80. Continue. 5. Fatigue-continue to exercise. TSH is normal. 6. Vertigo-self-limiting. Feeling better.  Signed, Bruce Furbish, Bruce [name] [facility]  01/11/2014 9:56 AM

## 2014-01-15 ENCOUNTER — Ambulatory Visit (HOSPITAL_COMMUNITY)
Admission: RE | Admit: 2014-01-15 | Discharge: 2014-01-15 | Disposition: A | Discharge status: Home / Self Care | Payer: Medicare HMO | Source: Ambulatory Visit | Attending: Cardiology | Admitting: Cardiology

## 2014-01-15 DIAGNOSIS — I2510 Atherosclerotic heart disease of native coronary artery without angina pectoris: Secondary | ICD-10-CM | POA: Diagnosis not present

## 2014-01-15 DIAGNOSIS — I359 Nonrheumatic aortic valve disorder, unspecified: Secondary | ICD-10-CM | POA: Diagnosis not present

## 2014-01-15 DIAGNOSIS — E785 Hyperlipidemia, unspecified: Secondary | ICD-10-CM | POA: Insufficient documentation

## 2014-01-15 DIAGNOSIS — I10 Essential (primary) hypertension: Secondary | ICD-10-CM | POA: Diagnosis not present

## 2014-01-15 DIAGNOSIS — I77810 Thoracic aortic ectasia: Secondary | ICD-10-CM | POA: Insufficient documentation

## 2014-01-15 DIAGNOSIS — I519 Heart disease, unspecified: Secondary | ICD-10-CM

## 2014-01-15 NOTE — Progress Notes (Signed)
2D Echo Performed 01/15/2014    Marygrace Drought, RCS

## 2014-04-26 ENCOUNTER — Encounter (HOSPITAL_COMMUNITY): Payer: Self-pay | Admitting: Cardiology

## 2014-05-15 ENCOUNTER — Ambulatory Visit (INDEPENDENT_AMBULATORY_CARE_PROVIDER_SITE_OTHER): Payer: Medicare PPO | Admitting: Ophthalmology

## 2014-05-21 ENCOUNTER — Ambulatory Visit (INDEPENDENT_AMBULATORY_CARE_PROVIDER_SITE_OTHER): Payer: Commercial Managed Care - HMO | Admitting: Ophthalmology

## 2014-05-21 DIAGNOSIS — H35372 Puckering of macula, left eye: Secondary | ICD-10-CM

## 2014-05-21 DIAGNOSIS — H3531 Nonexudative age-related macular degeneration: Secondary | ICD-10-CM

## 2014-05-21 DIAGNOSIS — H43813 Vitreous degeneration, bilateral: Secondary | ICD-10-CM | POA: Diagnosis not present

## 2014-05-21 DIAGNOSIS — H33302 Unspecified retinal break, left eye: Secondary | ICD-10-CM

## 2014-05-21 DIAGNOSIS — H35033 Hypertensive retinopathy, bilateral: Secondary | ICD-10-CM | POA: Diagnosis not present

## 2014-07-17 DIAGNOSIS — L989 Disorder of the skin and subcutaneous tissue, unspecified: Secondary | ICD-10-CM | POA: Diagnosis not present

## 2014-07-27 DIAGNOSIS — L989 Disorder of the skin and subcutaneous tissue, unspecified: Secondary | ICD-10-CM | POA: Diagnosis not present

## 2014-07-27 DIAGNOSIS — C44519 Basal cell carcinoma of skin of other part of trunk: Secondary | ICD-10-CM | POA: Diagnosis not present

## 2014-08-06 DIAGNOSIS — J209 Acute bronchitis, unspecified: Secondary | ICD-10-CM | POA: Diagnosis not present

## 2014-08-06 DIAGNOSIS — J039 Acute tonsillitis, unspecified: Secondary | ICD-10-CM | POA: Diagnosis not present

## 2014-08-06 DIAGNOSIS — Z4802 Encounter for removal of sutures: Secondary | ICD-10-CM | POA: Diagnosis not present

## 2014-08-06 DIAGNOSIS — C4490 Unspecified malignant neoplasm of skin, unspecified: Secondary | ICD-10-CM | POA: Diagnosis not present

## 2014-09-03 DIAGNOSIS — M79604 Pain in right leg: Secondary | ICD-10-CM | POA: Diagnosis not present

## 2014-09-03 DIAGNOSIS — M79605 Pain in left leg: Secondary | ICD-10-CM | POA: Diagnosis not present

## 2014-10-25 DIAGNOSIS — N200 Calculus of kidney: Secondary | ICD-10-CM | POA: Diagnosis not present

## 2014-10-25 DIAGNOSIS — Z0000 Encounter for general adult medical examination without abnormal findings: Secondary | ICD-10-CM | POA: Diagnosis not present

## 2014-10-25 DIAGNOSIS — I10 Essential (primary) hypertension: Secondary | ICD-10-CM | POA: Diagnosis not present

## 2014-10-25 DIAGNOSIS — Z955 Presence of coronary angioplasty implant and graft: Secondary | ICD-10-CM | POA: Diagnosis not present

## 2014-10-25 DIAGNOSIS — E780 Pure hypercholesterolemia: Secondary | ICD-10-CM | POA: Diagnosis not present

## 2014-10-25 DIAGNOSIS — N189 Chronic kidney disease, unspecified: Secondary | ICD-10-CM | POA: Diagnosis not present

## 2014-10-25 DIAGNOSIS — K253 Acute gastric ulcer without hemorrhage or perforation: Secondary | ICD-10-CM | POA: Diagnosis not present

## 2014-11-06 DIAGNOSIS — C4491 Basal cell carcinoma of skin, unspecified: Secondary | ICD-10-CM | POA: Diagnosis not present

## 2015-01-15 ENCOUNTER — Ambulatory Visit (INDEPENDENT_AMBULATORY_CARE_PROVIDER_SITE_OTHER): Payer: Commercial Managed Care - HMO | Admitting: Cardiology

## 2015-01-15 ENCOUNTER — Encounter: Payer: Self-pay | Admitting: Cardiology

## 2015-01-15 VITALS — BP 150/84 | HR 74 | Ht 65.5 in | Wt 179.5 lb

## 2015-01-15 DIAGNOSIS — I10 Essential (primary) hypertension: Secondary | ICD-10-CM | POA: Diagnosis not present

## 2015-01-15 DIAGNOSIS — E785 Hyperlipidemia, unspecified: Secondary | ICD-10-CM | POA: Diagnosis not present

## 2015-01-15 DIAGNOSIS — I2510 Atherosclerotic heart disease of native coronary artery without angina pectoris: Secondary | ICD-10-CM | POA: Diagnosis not present

## 2015-01-15 DIAGNOSIS — I77810 Thoracic aortic ectasia: Secondary | ICD-10-CM

## 2015-01-15 NOTE — Progress Notes (Signed)
[place]. [address], [place]  [address] Phone: [phone] Fax:  [phone]  Date:  01/15/2015   ID:  Bruce [name], DOB 08/05/1935, MRN [id number]  PCP:  Tamsen Roers, MD   History of Present Illness: Bruce [name] is a 79 y.o. male male with hx double vessel coronary artery disease here for annual followup. Stent to the RCA in 2002, mild to moderate LAD stenosis 40% diffuse proximal and mid. Stress test in 2007 showed mild peri-infarct ischemia in the basal inferior and mid inferior region as well as basal inferolateral region. Normal EF. He was having anginal like chest pain and I proceeded with cardiac cath 2013 with noted patent stent and EF 50%. Overall he is doing well. No significant anginal symptoms, no chest pain. No changes. Compliant  He has been complaining of some dizziness, vertigo-like symptoms. He has had this in the past. He is starting to feel better. Room as spinning sensation. I wonder if this is some hypotension as well. He did show me a blood pressure reading or a couple blood pressure readings in the upper 90s with associated mild tachycardia. These were transient. Certainly if he is having low blood pressure and feeling this way they are likely correlating with each other.    Wt Readings from Last 3 Encounters:  01/15/15 179 lb 8 oz (81.421 kg)  10/03/12 181 lb (82.101 kg)  07/01/11 172 lb (78.019 kg)     Past Medical History  Diagnosis Date  . Coronary artery disease   . Hypertension     Past Surgical History  Procedure Laterality Date  . Left heart catheterization with coronary angiogram N/A 07/01/2011    Procedure: LEFT HEART CATHETERIZATION WITH CORONARY ANGIOGRAM;  Surgeon: Candee Furbish, MD;  Location: [facility] CATH LAB;  Service: Cardiovascular;  Laterality: N/A;    Current Outpatient Prescriptions  Medication Sig Dispense Refill  . aspirin 81 MG chewable tablet Chew 81 mg by mouth daily.      Marland Kitchen atorvastatin (LIPITOR) 80 MG tablet  Take 80 mg by mouth daily.      . beta carotene w/minerals (OCUVITE) tablet Take 1 tablet by mouth daily.    Marland Kitchen doxazosin (CARDURA) 8 MG tablet Take 8 mg by mouth at bedtime.      Marland Kitchen omeprazole (PRILOSEC) 40 MG capsule Take 40 mg by mouth daily.    . quinapril (ACCUPRIL) 10 MG tablet Take 10 mg by mouth at bedtime.      . quinapril (ACCUPRIL) 20 MG tablet      No current facility-administered medications for this visit.    Allergies:    Allergies  Allergen Reactions  . Aldomet [Methyldopa]     Rash and itching  . Plavix [Clopidogrel Bisulfate]     Rash and itching  . Aldactone [Spironolactone] Rash    Social History:  The patient  reports that he has never smoked. He has never used smokeless tobacco. He reports that he does not drink alcohol or use illicit drugs.   Family History  Problem Relation Age of Onset  . Coronary artery disease Father   . Kidney disease Sister   . Hyperlipidemia Sister   . Heart disease Brother   . Diabetes Brother   . Coronary artery disease Brother   . Heart attack Sister     ROS:  Please see the history of present illness.   In general feels tired. Dr. Rex Kras has checked a TSH, normal. He has  had diarrhea, muscle pain, dizziness, snoring. Denies any fevers, chills, orthopnea, PND   All other systems reviewed and negative.   PHYSICAL EXAM: VS:  BP 150/84 mmHg  Pulse 74  Ht 5' 5.5" (1.664 m)  Wt 179 lb 8 oz (81.421 kg)  BMI 29.41 kg/m2 Well nourished, well developed, in no acute distress HEENT: normal, [place]/AT, EOMI Neck: no JVD, normal carotid upstroke, no bruit Cardiac:  normal S1, S2; RRR; no murmur Lungs:  clear to auscultation bilaterally, no wheezing, rhonchi or rales Abd: soft, nontender, no hepatomegaly, no bruits Ext: no edema, 2+ distal pulses Skin: warm and dry GU: deferred Neuro: no focal abnormalities noted, AAO x 3  EKG:  Today 01/15/15-sinus rhythm, 74, lateral infarct pattern, no significant change. 01/11/14-sinus rhythm, 84  with PVC, old inferior infarct pattern.     ASSESSMENT AND PLAN:  1. Dilated aortic root - 69mm 2010. 37 mm in 2015-stable. We will contemplate repeating echocardiogram in the next couple years.  2. Coronary artery disease-stable. No angina. RCA stent. Patent in 2013. 3. Hypertension-well-controlled. No changes made. ACE inhibitor. No beta blocker because of fatigue. He has been on Cardura for years for his hypertension. He did show me a few readings that were quite low, 95 systolic with slightly elevated heart rate. If this continues to occur, labile blood pressures that is, we may need to adjust his medications. He does not recall being on amlodipine in the past. Perhaps switching him from Cardura to amlodipine would help however Dr. Rex Kras may have tried this in the past. 4. Hyperlipidemia-LDL 85/ in 15-atorvastatin 80. Continue. Dr. Rex Kras has been monitoring his lipids. 5. Fatigue-continue to exercise. TSH is normal. 6. Vertigo-self-limiting. Feeling better. Hypotension could also cause sensation. 7. One-year follow-up.  Signed, Candee Furbish, MD [facility]  01/15/2015 8:57 AM

## 2015-01-15 NOTE — Patient Instructions (Signed)
Medication Instructions:  Your physician recommends that you continue on your current medications as directed. Please refer to the Current Medication list given to you today.  Follow-Up: Follow up in 1 year with Dr. Skains.  You will receive a letter in the mail 2 months before you are due.  Please call us when you receive this letter to schedule your follow up appointment.  Thank you for choosing [facility]!!       

## 2015-01-31 DIAGNOSIS — K219 Gastro-esophageal reflux disease without esophagitis: Secondary | ICD-10-CM | POA: Diagnosis not present

## 2015-01-31 DIAGNOSIS — Z1211 Encounter for screening for malignant neoplasm of colon: Secondary | ICD-10-CM | POA: Diagnosis not present

## 2015-02-11 DIAGNOSIS — N5201 Erectile dysfunction due to arterial insufficiency: Secondary | ICD-10-CM | POA: Diagnosis not present

## 2015-03-11 DIAGNOSIS — Z23 Encounter for immunization: Secondary | ICD-10-CM | POA: Diagnosis not present

## 2015-05-19 HISTORY — PX: CARDIAC CATHETERIZATION: SHX172

## 2015-05-23 ENCOUNTER — Ambulatory Visit (INDEPENDENT_AMBULATORY_CARE_PROVIDER_SITE_OTHER): Payer: Commercial Managed Care - HMO | Admitting: Ophthalmology

## 2015-05-23 DIAGNOSIS — H353131 Nonexudative age-related macular degeneration, bilateral, early dry stage: Secondary | ICD-10-CM | POA: Diagnosis not present

## 2015-05-23 DIAGNOSIS — H43813 Vitreous degeneration, bilateral: Secondary | ICD-10-CM

## 2015-05-23 DIAGNOSIS — I10 Essential (primary) hypertension: Secondary | ICD-10-CM

## 2015-05-23 DIAGNOSIS — H35372 Puckering of macula, left eye: Secondary | ICD-10-CM

## 2015-05-23 DIAGNOSIS — H35033 Hypertensive retinopathy, bilateral: Secondary | ICD-10-CM

## 2015-07-17 DIAGNOSIS — J209 Acute bronchitis, unspecified: Secondary | ICD-10-CM | POA: Diagnosis not present

## 2015-07-17 DIAGNOSIS — R05 Cough: Secondary | ICD-10-CM | POA: Diagnosis not present

## 2015-07-18 ENCOUNTER — Encounter (HOSPITAL_BASED_OUTPATIENT_CLINIC_OR_DEPARTMENT_OTHER): Payer: Self-pay | Admitting: *Deleted

## 2015-07-18 ENCOUNTER — Emergency Department (HOSPITAL_BASED_OUTPATIENT_CLINIC_OR_DEPARTMENT_OTHER): Payer: Commercial Managed Care - HMO

## 2015-07-18 ENCOUNTER — Emergency Department (HOSPITAL_BASED_OUTPATIENT_CLINIC_OR_DEPARTMENT_OTHER)
Admission: EM | Admit: 2015-07-18 | Discharge: 2015-07-18 | Disposition: A | Discharge status: Home / Self Care | Payer: Commercial Managed Care - HMO | Attending: Emergency Medicine | Admitting: Emergency Medicine

## 2015-07-18 DIAGNOSIS — I2510 Atherosclerotic heart disease of native coronary artery without angina pectoris: Secondary | ICD-10-CM | POA: Insufficient documentation

## 2015-07-18 DIAGNOSIS — Z79899 Other long term (current) drug therapy: Secondary | ICD-10-CM | POA: Diagnosis not present

## 2015-07-18 DIAGNOSIS — I10 Essential (primary) hypertension: Secondary | ICD-10-CM | POA: Insufficient documentation

## 2015-07-18 DIAGNOSIS — R062 Wheezing: Secondary | ICD-10-CM | POA: Diagnosis not present

## 2015-07-18 DIAGNOSIS — R05 Cough: Secondary | ICD-10-CM | POA: Insufficient documentation

## 2015-07-18 DIAGNOSIS — Z7982 Long term (current) use of aspirin: Secondary | ICD-10-CM | POA: Insufficient documentation

## 2015-07-18 DIAGNOSIS — Z9889 Other specified postprocedural states: Secondary | ICD-10-CM | POA: Insufficient documentation

## 2015-07-18 DIAGNOSIS — R059 Cough, unspecified: Secondary | ICD-10-CM

## 2015-07-18 MED ORDER — ALBUTEROL (5 MG/ML) CONTINUOUS INHALATION SOLN
10.0000 mg/h | INHALATION_SOLUTION | RESPIRATORY_TRACT | Status: AC
  Administered 2015-07-18: 10 mg/h via RESPIRATORY_TRACT
  Filled 2015-07-18: qty 20

## 2015-07-18 MED ORDER — IPRATROPIUM-ALBUTEROL 0.5-2.5 (3) MG/3ML IN SOLN
3.0000 mL | Freq: Once | RESPIRATORY_TRACT | Status: AC
  Administered 2015-07-18: 3 mL via RESPIRATORY_TRACT
  Filled 2015-07-18: qty 3

## 2015-07-18 MED ORDER — ALBUTEROL SULFATE (2.5 MG/3ML) 0.083% IN NEBU
2.5000 mg | INHALATION_SOLUTION | Freq: Once | RESPIRATORY_TRACT | Status: AC
  Administered 2015-07-18: 2.5 mg via RESPIRATORY_TRACT
  Filled 2015-07-18: qty 3

## 2015-07-18 MED ORDER — PREDNISONE 20 MG PO TABS: 40.0000 mg | ORAL_TABLET | Freq: Every day | ORAL | Status: DC

## 2015-07-18 MED ORDER — IPRATROPIUM-ALBUTEROL 0.5-2.5 (3) MG/3ML IN SOLN
3.0000 mL | Freq: Four times a day (QID) | RESPIRATORY_TRACT | Status: DC
  Administered 2015-07-18: 3 mL via RESPIRATORY_TRACT
  Filled 2015-07-18: qty 3

## 2015-07-18 MED ORDER — PREDNISONE 50 MG PO TABS
60.0000 mg | ORAL_TABLET | Freq: Once | ORAL | Status: AC
  Administered 2015-07-18: 60 mg via ORAL
  Filled 2015-07-18: qty 1

## 2015-07-18 MED ORDER — ALBUTEROL SULFATE HFA 108 (90 BASE) MCG/ACT IN AERS
2.0000 | INHALATION_SPRAY | RESPIRATORY_TRACT | Status: DC | PRN
  Administered 2015-07-18: 2 via RESPIRATORY_TRACT
  Filled 2015-07-18: qty 6.7

## 2015-07-18 NOTE — ED Notes (Signed)
Pt ambulated with this RN around ED. O2 sats 94-95%. Denies any increase in s/s. BBS-Rhonchi and some mild forced expiratory wheezing noted. Denies other s/s at present.

## 2015-07-18 NOTE — ED Notes (Signed)
Pt states he has a cough for the past 9 days, seen an MD and has been on a z-pak which he has completed, now on Levofloxacin 500mg , just not feeling better. RT consulted

## 2015-07-18 NOTE — ED Notes (Signed)
Cough x 1 week. Started on second round of antibiotics yesterday by PCP. Had chest xray yesterday that showed NO pneumonia. Was advised by his PCP to come to ED if oxygen sats stayed below 94%. States his Sats have been running between 89-92% at home. O2 sats 95-97% in triage

## 2015-07-18 NOTE — ED Notes (Signed)
Pt receiving treatment. Denies pain or SHOB. Family at bedside.

## 2015-07-18 NOTE — ED Notes (Signed)
Patient transported to radiology department stretcher.

## 2015-07-18 NOTE — Discharge Instructions (Signed)

## 2015-07-18 NOTE — ED Provider Notes (Signed)
CSN: [id number]     Arrival date & time 07/18/15  1704 History   First MD Initiated Contact with Patient 07/18/15 1801     Chief Complaint  Patient presents with  . Cough     (Consider location/radiation/quality/duration/timing/severity/associated sxs/prior Treatment) HPI Comments: Patient presents to the emergency department with chief complaint of cough. He states that he has had a cough for the past 9 days. He states that he has been taking a Z-Pak, which she finished, and then was prescribed Levaquin by his primary care doctor. He states that he has had some wheezing. He denies any history of COPD or asthma. Any fevers or chills. Denies any nausea or vomiting. Denies any chest pain. He states he does have some associated shortness of breath, which she attributes to wheezing. There are no modifying factors.  The history is provided by the patient. No language interpreter was used.    Past Medical History  Diagnosis Date  . Coronary artery disease   . Hypertension    Past Surgical History  Procedure Laterality Date  . Left heart catheterization with coronary angiogram N/A 07/01/2011    Procedure: LEFT HEART CATHETERIZATION WITH CORONARY ANGIOGRAM;  Surgeon: Candee Furbish, MD;  Location: [facility] CATH LAB;  Service: Cardiovascular;  Laterality: N/A;  . Knee surgery     Family History  Problem Relation Age of Onset  . Coronary artery disease Father   . Kidney disease Sister   . Hyperlipidemia Sister   . Heart disease Brother   . Diabetes Brother   . Coronary artery disease Brother   . Heart attack Sister    Social History  Substance Use Topics  . Smoking status: Never Smoker   . Smokeless tobacco: Never Used  . Alcohol Use: No    Review of Systems  All other systems reviewed and are negative.     Allergies  Aldomet; Plavix; and Aldactone  Home Medications   Prior to Admission medications   Medication Sig Start Date End Date Taking? Authorizing Provider  aspirin 81 MG  chewable tablet Chew 81 mg by mouth daily.     Yes Historical Provider, MD  atorvastatin (LIPITOR) 80 MG tablet Take 80 mg by mouth daily.     Yes Historical Provider, MD  beta carotene w/minerals (OCUVITE) tablet Take 1 tablet by mouth daily.   Yes Historical Provider, MD  doxazosin (CARDURA) 8 MG tablet Take 8 mg by mouth at bedtime.     Yes Historical Provider, MD  omeprazole (PRILOSEC) 40 MG capsule Take 40 mg by mouth daily.   Yes Historical Provider, MD  quinapril (ACCUPRIL) 10 MG tablet Take 10 mg by mouth at bedtime.     Yes Historical Provider, MD  quinapril (ACCUPRIL) 20 MG tablet  12/27/14  Yes Historical Provider, MD   BP 127/77 mmHg  Pulse 114  Temp(Src) 99.1 F (37.3 C) (Oral)  Resp 18  Ht 5\' 5"  (1.651 m)  Wt 82.101 kg  BMI 30.12 kg/m2  SpO2 92% Physical Exam  Constitutional: He appears well-developed and well-nourished. No distress.  HENT:  Head: Normocephalic.  Right Ear: External ear normal.  Left Ear: External ear normal.  Mildly erythematous, no tonsillar exudate, no abscess, no stridor, uvula is midline  TMs clear bilaterally  Eyes: Conjunctivae and EOM are normal. Pupils are equal, round, and reactive to light.  Neck: Normal range of motion. Neck supple.  Cardiovascular: Normal rate, regular rhythm and normal heart sounds.  Exam reveals no gallop and no friction  rub.   No murmur heard. Pulmonary/Chest: Effort normal. No stridor. No respiratory distress. He has wheezes. He has no rales. He exhibits no tenderness.  Bilateral wheezes  Abdominal: Soft. Bowel sounds are normal. He exhibits no distension. There is no tenderness.  Musculoskeletal: Normal range of motion. He exhibits no tenderness.  Neurological: He is alert.  Skin: Skin is warm and dry. No rash noted. He is not diaphoretic.  Psychiatric: He has a normal mood and affect. His behavior is normal. Judgment and thought content normal.  Nursing note and vitals reviewed.   ED Course  Procedures  (including critical care time) Labs Review Labs Reviewed - No data to display  Imaging Review Dg Chest 2 View  07/18/2015  CLINICAL DATA:  Cough and sinus congestion x 9 days, pt was on a round of antibiotic with no relief. HX: CAD, HTN, LFT Heart catherization. nonsmoker. EXAM: CHEST  2 VIEW COMPARISON:  07/17/2015 FINDINGS: Cardiac silhouette is borderline enlarged. Aorta is uncoiled. No mediastinal or hilar masses or evidence of adenopathy. Prominent bronchovascular markings are stable. No evidence of pneumonia or edema. No pleural effusion or pneumothorax. Bony thorax is demineralized but grossly intact. IMPRESSION: No acute cardiopulmonary disease. Electronically Signed   By: Lajean Manes M.D.   On: 07/18/2015 18:28   I have personally reviewed and evaluated these images and lab results as part of my medical decision-making.   EKG Interpretation None      MDM   Final diagnoses:  Cough  Wheeze    Patient with cough and wheezing. Will give breathing treatment and check chest x-ray. Patient has been treated with Levaquin by his primary care provider. He still has a few tablets left.  Patient feels improved after several nebulizer treatments. Chest x-ray is clear. Patient ambulates maintaining 94-95% oxygen saturation. Discussed with Dr. Marjo Bicker, who agrees with plan for discharge to home.    Montine Circle, PA-C 07/18/15 2143  Fredia Sorrow, MD 07/18/15 2202

## 2015-07-18 NOTE — ED Notes (Signed)
Placed on cont POX monitoring 

## 2015-07-25 DIAGNOSIS — R0609 Other forms of dyspnea: Secondary | ICD-10-CM | POA: Diagnosis not present

## 2015-07-25 DIAGNOSIS — I10 Essential (primary) hypertension: Secondary | ICD-10-CM | POA: Diagnosis not present

## 2015-07-25 NOTE — Progress Notes (Signed)
Cardiology Office Note:    Date:  07/26/2015   ID:  Bruce [name], DOB 12-17-1935, MRN [id number]  PCP:  Tamsen Roers, MD  Cardiologist:  Dr. Candee Furbish   Electrophysiologist:  n/a  Chief Complaint  Patient presents with  . Elevated HR    History of Present Illness:     Bruce [name] is a 80 y.o. male with a hx of CAD status post stenting to the RCA in 2002, HTN, HL. Cardiac catheterization in 2013 demonstrated patent RCA stent and mild nonobstructive disease elsewhere. Echocardiogram in 2015 with normal LV function and mild diastolic dysfunction.  Last seen by Dr. Candee Furbish in 8/16.  Patient was recently seen by primary care. He was noted to have cardiomegaly on a chest x-ray as well as a fast heart rate in the office (113). His PCP spoke to Dr. Marlou Porch by telephone yesterday. Patient was asked to follow-up today. The patient notes recent history of bronchitis that lingered on for more than 2 weeks. Since that time, he has noticed that his heart rate will increase from 60 to 120 with doing simple tasks such as getting up out of bed and going to the shower. He has a strange uncomfortable feeling in his chest at times that is associated with dyspnea on exertion. He has been fatigued for quite some time without significant change. He denies syncope or near-syncope. He has occasional dizziness. He denies orthopnea, PND or edema.   Past Medical History  Diagnosis Date  . Coronary artery disease   . Hypertension     Past Surgical History  Procedure Laterality Date  . Left heart catheterization with coronary angiogram N/A 07/01/2011    Procedure: LEFT HEART CATHETERIZATION WITH CORONARY ANGIOGRAM;  Surgeon: Candee Furbish, MD;  Location: [facility] CATH LAB;  Service: Cardiovascular;  Laterality: N/A;  . Knee surgery      Current Medications: Outpatient Prescriptions Prior to Visit  Medication Sig Dispense Refill  . aspirin 81 MG chewable tablet Chew 81 mg by mouth daily.      Marland Kitchen atorvastatin  (LIPITOR) 80 MG tablet Take 80 mg by mouth daily.      . beta carotene w/minerals (OCUVITE) tablet Take 1 tablet by mouth daily.    Marland Kitchen doxazosin (CARDURA) 8 MG tablet Take 8 mg by mouth at bedtime.      Marland Kitchen omeprazole (PRILOSEC) 40 MG capsule Take 40 mg by mouth daily.    . quinapril (ACCUPRIL) 10 MG tablet Take 10 mg by mouth at bedtime.      . quinapril (ACCUPRIL) 20 MG tablet Take 20 mg by mouth daily.     . predniSONE (DELTASONE) 20 MG tablet Take 2 tablets (40 mg total) by mouth daily. (Patient not taking: Reported on 07/26/2015) 10 tablet 0   No facility-administered medications prior to visit.     Allergies:   Aldomet; Plavix; and Aldactone   Social History   Social History  . Marital Status: Married    Spouse Name: N/A  . Number of Children: N/A  . Years of Education: N/A   Social History Main Topics  . Smoking status: Never Smoker   . Smokeless tobacco: Never Used  . Alcohol Use: No  . Drug Use: No  . Sexual Activity: Yes   Other Topics Concern  . None   Social History Narrative     Family History:  The patient's family history includes Coronary artery disease in his brother and father; Diabetes in his brother; Heart attack in  his sister; Heart disease in his brother; Hyperlipidemia in his sister; Kidney disease in his sister.   ROS:   Please see the history of present illness.    Review of Systems  Constitution: Positive for malaise/fatigue.  Respiratory: Positive for cough and snoring.   All other systems reviewed and are negative.   Physical Exam:    VS:  BP 138/80 mmHg  Pulse 84  Ht 5\' 5"  (1.651 m)  Wt 176 lb 12.8 oz (80.196 kg)  BMI 29.42 kg/m2  SpO2 96%   GEN: Well nourished, well developed, in no acute distress HEENT: normal Neck: no JVD, no masses Cardiac: Normal S1/S2, RRR; no murmurs, rubs, or gallops, no edema   Respiratory:  clear to auscultation bilaterally; no wheezing, rhonchi or rales GI: soft, nontender, nondistended MS: no deformity or  atrophy Skin: warm and dry Neuro: No focal deficits  Psych: Alert and oriented x 3, normal affect  Wt Readings from Last 3 Encounters:  07/26/15 176 lb 12.8 oz (80.196 kg)  07/18/15 181 lb (82.101 kg)  01/15/15 179 lb 8 oz (81.421 kg)      Studies/Labs Reviewed:     EKG:  EKG is  ordered today.  The ekg ordered today demonstrates NSR, HR 83, inf Q waves, QTc 423 ms  Recent Labs: No results found for requested labs within last 365 days.   Recent Lipid Panel    Component Value Date/Time   CHOL  04/30/2009 0535    135        ATP III CLASSIFICATION:  <200     mg/dL   Desirable  200-239  mg/dL   Borderline High  >=240    mg/dL   High          TRIG 70 04/30/2009 0535   HDL 43 04/30/2009 0535   CHOLHDL 3.1 04/30/2009 0535   VLDL 14 04/30/2009 0535   LDLCALC  04/30/2009 0535    78        Total Cholesterol/HDL:CHD Risk Coronary Heart Disease Risk Table                     Men   Women  1/2 Average Risk   3.4   3.3  Average Risk       5.0   4.4  2 X Average Risk   9.6   7.1  3 X Average Risk  23.4   11.0        Use the calculated Patient Ratio above and the CHD Risk Table to determine the patient's CHD Risk.        ATP III CLASSIFICATION (LDL):  <100     mg/dL   Optimal  100-129  mg/dL   Near or Above                    Optimal  130-159  mg/dL   Borderline  160-189  mg/dL   High  >190     mg/dL   Very High    Additional studies/ records that were reviewed today include:   Dg Chest 2 View  07/18/2015  CLINICAL DATA:  Cough and sinus congestion x 9 days, pt was on a round of antibiotic with no relief. HX: CAD, HTN, LFT Heart catherization. nonsmoker. EXAM: CHEST  2 VIEW COMPARISON:  07/17/2015 FINDINGS: Cardiac silhouette is borderline enlarged. Aorta is uncoiled. No mediastinal or hilar masses or evidence of adenopathy. Prominent bronchovascular markings are stable. No evidence of pneumonia or edema.  No pleural effusion or pneumothorax. Bony thorax is demineralized but  grossly intact. IMPRESSION: No acute cardiopulmonary disease. Electronically Signed   By: Lajean Manes M.D.   On: 07/18/2015 18:28    Echo 8/15 EF 123456, grade 1 diastolic dysfunction  LHC 2/13 Left main: No angiographically significant disease Left anterior descending (LAD): Minor 30-40% mid LAD after a first small septal branch which appeared to be I this in the previous angiogram. Tortuous vessel. Large diagonal branch. Circumflex artery (CIRC): 1 large obtuse marginal. Tortuous. No angiographically significant disease Right coronary artery (RCA): Large bend in possible right coronary artery. The previously placed stent in the proximal section/midsection has only 10-20% in-stent stenosis. Overall excellent. PDA and posterior lateral branch are only minor luminal irregularities. LEFT VENTRICULOGRAM: Left ventricular angiogram was done in the 30 RAO projection and revealed basal inferior wall akinesis consistent with prior infarct as seen on nuclear stress test. Systolic function with an estimated ejection fraction of 50% %.  IMPRESSIONS: 1. Previously placed RCA stent with only 10% in-stent stenosis, overall excellent. Prior LAD with 30% stenosis at large diagonal branch bifurcation. 2. Low normal ejection fraction with inferior basal wall akinesis. EF 50%.     ASSESSMENT:     1. Tachycardia   2. Chest pain, unspecified chest pain type   3. Cardiac enlargement   4. Coronary artery disease involving native coronary artery of native heart without angina pectoris   5. Essential hypertension   6. Dyslipidemia     PLAN:     In order of problems listed above:  1. Tachycardia - Suspect related to deconditioning from recent illness.  Reviewed with Dr. Candee Furbish.  Will get ETT-Myoview.  This will help assess heart rate and rhythm during exercise.  2. Chest pain - Somewhat atypical.  No ischemic evaluation since 2013. As noted, will get ETT-Myoview.  3. Cardiac enlargement - Concern  for cardiomegaly on CXR.  Will get FU Echo.  FU as planned unless Echo +/- Stress Nuc study are abnormal.   4. CAD - Atypical CP. Will get ETT-Nuc study.  Continue ASA, statin.  He is not on beta-blocker due to chronic fatigue.    5. HTN - Controlled.   6. HL - Continue statin.    Medication Adjustments/Labs and Tests Ordered: Current medicines are reviewed at length with the patient today.  Concerns regarding medicines are outlined above.  Medication changes, Labs and Tests ordered today are outlined in the Patient Instructions noted below. Patient Instructions  Medication Instructions:  Your physician recommends that you continue on your current medications as directed. Please refer to the Current Medication list given to you today.  Labwork: NONE  Testing/Procedures: 1. Your physician has requested that you have an echocardiogram. Echocardiography is a painless test that uses sound waves to create images of your heart. It provides your doctor with information about the size and shape of your heart and how well your heart's chambers and valves are working. This procedure takes approximately one hour. There are no restrictions for this procedure.  2. Your physician has requested that you have en exercise stress myoview. For further information please visit [email or web address]. Please follow instruction sheet, as given.  Follow-Up: June 2017 WITH DR. Marlou Porch  Any Other Special Instructions Will Be Listed Below (If Applicable).  If you need a refill on your cardiac medications before your next appointment, please call your pharmacy.   Signed, Richardson Dopp, PA-C  07/26/2015 2:45 PM    [facility]  [place], [place], [place]  [address] Phone: [phone]; Fax: [phone]

## 2015-07-26 ENCOUNTER — Encounter: Payer: Self-pay | Admitting: Physician Assistant

## 2015-07-26 ENCOUNTER — Ambulatory Visit (INDEPENDENT_AMBULATORY_CARE_PROVIDER_SITE_OTHER): Payer: Commercial Managed Care - HMO | Admitting: Physician Assistant

## 2015-07-26 VITALS — BP 138/80 | HR 84 | Ht 65.0 in | Wt 176.8 lb

## 2015-07-26 DIAGNOSIS — R079 Chest pain, unspecified: Secondary | ICD-10-CM | POA: Diagnosis not present

## 2015-07-26 DIAGNOSIS — R000 Tachycardia, unspecified: Secondary | ICD-10-CM | POA: Diagnosis not present

## 2015-07-26 DIAGNOSIS — I10 Essential (primary) hypertension: Secondary | ICD-10-CM

## 2015-07-26 DIAGNOSIS — I517 Cardiomegaly: Secondary | ICD-10-CM | POA: Diagnosis not present

## 2015-07-26 DIAGNOSIS — E785 Hyperlipidemia, unspecified: Secondary | ICD-10-CM

## 2015-07-26 DIAGNOSIS — I2510 Atherosclerotic heart disease of native coronary artery without angina pectoris: Secondary | ICD-10-CM

## 2015-07-26 NOTE — Patient Instructions (Addendum)
Medication Instructions:  Your physician recommends that you continue on your current medications as directed. Please refer to the Current Medication list given to you today.  Labwork: NONE  Testing/Procedures: 1. Your physician has requested that you have an echocardiogram. Echocardiography is a painless test that uses sound waves to create images of your heart. It provides your doctor with information about the size and shape of your heart and how well your heart's chambers and valves are working. This procedure takes approximately one hour. There are no restrictions for this procedure.  2. Your physician has requested that you have en exercise stress myoview. For further information please visit [email or web address]. Please follow instruction sheet, as given.  Follow-Up: June 2017 WITH DR. Marlou Porch  Any Other Special Instructions Will Be Listed Below (If Applicable).  If you need a refill on your cardiac medications before your next appointment, please call your pharmacy.

## 2015-08-06 ENCOUNTER — Ambulatory Visit: Payer: Commercial Managed Care - HMO | Admitting: Cardiology

## 2015-08-07 ENCOUNTER — Telehealth (HOSPITAL_COMMUNITY): Payer: Self-pay | Admitting: *Deleted

## 2015-08-07 NOTE — Telephone Encounter (Signed)
Patient given detailed instructions per Myocardial Perfusion Study Information Sheet for the test on 08/12/15. Patient notified to arrive 15 minutes early and that it is imperative to arrive on time for appointment to keep from having the test rescheduled.  If you need to cancel or reschedule your appointment, please call the office within 24 hours of your appointment. Failure to do so may result in a cancellation of your appointment, and a $50 no show fee. Patient verbalized understanding.Shelley Pooley J Delane Wessinger, RN  

## 2015-08-12 ENCOUNTER — Ambulatory Visit (HOSPITAL_BASED_OUTPATIENT_CLINIC_OR_DEPARTMENT_OTHER): Payer: Commercial Managed Care - HMO

## 2015-08-12 ENCOUNTER — Other Ambulatory Visit: Payer: Self-pay

## 2015-08-12 ENCOUNTER — Ambulatory Visit (HOSPITAL_COMMUNITY): Payer: Commercial Managed Care - HMO | Attending: Cardiology

## 2015-08-12 DIAGNOSIS — I517 Cardiomegaly: Secondary | ICD-10-CM

## 2015-08-12 DIAGNOSIS — I2510 Atherosclerotic heart disease of native coronary artery without angina pectoris: Secondary | ICD-10-CM | POA: Diagnosis not present

## 2015-08-12 DIAGNOSIS — R0609 Other forms of dyspnea: Secondary | ICD-10-CM | POA: Insufficient documentation

## 2015-08-12 DIAGNOSIS — I071 Rheumatic tricuspid insufficiency: Secondary | ICD-10-CM | POA: Insufficient documentation

## 2015-08-12 DIAGNOSIS — I119 Hypertensive heart disease without heart failure: Secondary | ICD-10-CM | POA: Insufficient documentation

## 2015-08-12 DIAGNOSIS — I340 Nonrheumatic mitral (valve) insufficiency: Secondary | ICD-10-CM | POA: Diagnosis not present

## 2015-08-12 DIAGNOSIS — R079 Chest pain, unspecified: Secondary | ICD-10-CM | POA: Diagnosis not present

## 2015-08-12 DIAGNOSIS — R5383 Other fatigue: Secondary | ICD-10-CM | POA: Diagnosis not present

## 2015-08-12 DIAGNOSIS — R42 Dizziness and giddiness: Secondary | ICD-10-CM | POA: Diagnosis not present

## 2015-08-12 DIAGNOSIS — R9439 Abnormal result of other cardiovascular function study: Secondary | ICD-10-CM | POA: Insufficient documentation

## 2015-08-12 DIAGNOSIS — E785 Hyperlipidemia, unspecified: Secondary | ICD-10-CM | POA: Diagnosis not present

## 2015-08-12 LAB — MYOCARDIAL PERFUSION IMAGING
CHL CUP RESTING HR STRESS: 68 {beats}/min
CSEPED: 5 min
CSEPEDS: 0 s
CSEPPHR: 133 {beats}/min
Estimated workload: 7 METS
LV dias vol: 98 mL (ref 62–150)
LVSYSVOL: 48 mL
MPHR: 143 {beats}/min
NUC STRESS TID: 0.81
Percent HR: 93 %
RATE: 0.35
RPE: 18
SDS: 10
SRS: 4
SSS: 14

## 2015-08-12 MED ORDER — TECHNETIUM TC 99M SESTAMIBI GENERIC - CARDIOLITE
10.1000 | Freq: Once | INTRAVENOUS | Status: AC | PRN
  Administered 2015-08-12: 10.1 via INTRAVENOUS

## 2015-08-12 MED ORDER — TECHNETIUM TC 99M SESTAMIBI GENERIC - CARDIOLITE
32.9000 | Freq: Once | INTRAVENOUS | Status: AC | PRN
Start: 2015-08-12 — End: 2015-08-12
  Administered 2015-08-12: 32.9 via INTRAVENOUS

## 2015-08-13 ENCOUNTER — Telehealth: Payer: Self-pay | Admitting: *Deleted

## 2015-08-13 ENCOUNTER — Encounter: Payer: Self-pay | Admitting: Physician Assistant

## 2015-08-13 NOTE — Telephone Encounter (Signed)
Lmtcb for myoview results

## 2015-08-14 NOTE — Telephone Encounter (Signed)
F/u  Pt retrurning RN phone call- lab work. Please call back and discuss.

## 2015-08-14 NOTE — Telephone Encounter (Signed)
Pt has been notified of both echo and myoview results by phone with verbal understanding to all results.

## 2015-09-23 DIAGNOSIS — S70362A Insect bite (nonvenomous), left thigh, initial encounter: Secondary | ICD-10-CM | POA: Diagnosis not present

## 2015-10-03 DIAGNOSIS — L989 Disorder of the skin and subcutaneous tissue, unspecified: Secondary | ICD-10-CM | POA: Diagnosis not present

## 2015-10-15 DIAGNOSIS — X32XXXA Exposure to sunlight, initial encounter: Secondary | ICD-10-CM | POA: Diagnosis not present

## 2015-10-15 DIAGNOSIS — D225 Melanocytic nevi of trunk: Secondary | ICD-10-CM | POA: Diagnosis not present

## 2015-10-15 DIAGNOSIS — C44311 Basal cell carcinoma of skin of nose: Secondary | ICD-10-CM | POA: Diagnosis not present

## 2015-10-15 DIAGNOSIS — L570 Actinic keratosis: Secondary | ICD-10-CM | POA: Diagnosis not present

## 2015-10-15 DIAGNOSIS — S70362A Insect bite (nonvenomous), left thigh, initial encounter: Secondary | ICD-10-CM | POA: Diagnosis not present

## 2015-10-15 DIAGNOSIS — C44619 Basal cell carcinoma of skin of left upper limb, including shoulder: Secondary | ICD-10-CM | POA: Diagnosis not present

## 2015-11-07 DIAGNOSIS — Z8719 Personal history of other diseases of the digestive system: Secondary | ICD-10-CM | POA: Diagnosis not present

## 2015-11-07 DIAGNOSIS — Z0000 Encounter for general adult medical examination without abnormal findings: Secondary | ICD-10-CM | POA: Diagnosis not present

## 2015-11-07 DIAGNOSIS — H3530 Unspecified macular degeneration: Secondary | ICD-10-CM | POA: Diagnosis not present

## 2015-11-07 DIAGNOSIS — Z79899 Other long term (current) drug therapy: Secondary | ICD-10-CM | POA: Diagnosis not present

## 2015-11-07 DIAGNOSIS — I25119 Atherosclerotic heart disease of native coronary artery with unspecified angina pectoris: Secondary | ICD-10-CM | POA: Diagnosis not present

## 2015-11-07 DIAGNOSIS — E7800 Pure hypercholesterolemia, unspecified: Secondary | ICD-10-CM | POA: Diagnosis not present

## 2015-11-07 DIAGNOSIS — N189 Chronic kidney disease, unspecified: Secondary | ICD-10-CM | POA: Diagnosis not present

## 2015-11-07 DIAGNOSIS — I10 Essential (primary) hypertension: Secondary | ICD-10-CM | POA: Diagnosis not present

## 2015-11-07 DIAGNOSIS — Z87442 Personal history of urinary calculi: Secondary | ICD-10-CM | POA: Diagnosis not present

## 2015-11-07 DIAGNOSIS — L570 Actinic keratosis: Secondary | ICD-10-CM | POA: Diagnosis not present

## 2015-12-03 DIAGNOSIS — Z85828 Personal history of other malignant neoplasm of skin: Secondary | ICD-10-CM | POA: Diagnosis not present

## 2015-12-03 DIAGNOSIS — L570 Actinic keratosis: Secondary | ICD-10-CM | POA: Diagnosis not present

## 2015-12-03 DIAGNOSIS — X32XXXD Exposure to sunlight, subsequent encounter: Secondary | ICD-10-CM | POA: Diagnosis not present

## 2015-12-03 DIAGNOSIS — D225 Melanocytic nevi of trunk: Secondary | ICD-10-CM | POA: Diagnosis not present

## 2015-12-03 DIAGNOSIS — Z08 Encounter for follow-up examination after completed treatment for malignant neoplasm: Secondary | ICD-10-CM | POA: Diagnosis not present

## 2016-01-16 ENCOUNTER — Ambulatory Visit (INDEPENDENT_AMBULATORY_CARE_PROVIDER_SITE_OTHER): Payer: Commercial Managed Care - HMO | Admitting: Cardiology

## 2016-01-16 ENCOUNTER — Encounter: Payer: Self-pay | Admitting: Cardiology

## 2016-01-16 VITALS — BP 142/70 | HR 78 | Ht 65.0 in | Wt 176.8 lb

## 2016-01-16 DIAGNOSIS — I2510 Atherosclerotic heart disease of native coronary artery without angina pectoris: Secondary | ICD-10-CM | POA: Diagnosis not present

## 2016-01-16 DIAGNOSIS — I77810 Thoracic aortic ectasia: Secondary | ICD-10-CM

## 2016-01-16 DIAGNOSIS — I10 Essential (primary) hypertension: Secondary | ICD-10-CM

## 2016-01-16 NOTE — Progress Notes (Signed)
[facility]. [address], [address], [place]  [address] Phone: [phone] Fax:  [phone]  Date:  01/16/2016   ID:  Bruce [name], DOB 1936/01/11, MRN [id number]  PCP:  Tamsen Roers, MD   History of Present Illness: Bruce [name] is a 80 y.o. male male with hx double vessel coronary artery disease here for annual followup. Stent to the RCA in 2002, mild to moderate LAD stenosis 40% diffuse proximal and mid. Stress test in 2007 showed mild peri-infarct ischemia in the basal inferior and mid inferior region as well as basal inferolateral region. Normal EF. He was having anginal like chest pain and I proceeded with cardiac cath 2013 with noted patent stent and EF 50%. Overall he is doing well. No significant anginal symptoms, no chest pain. No changes. Compliant. No medication side effects. In early 2017 underwent both an echocardiogram and a stress test that showed no ischemia. Small inferior infarct pattern.  Other than bronchitis, he had been feeling well.  After this visit, he is going off to the beach for relaxation.     Wt Readings from Last 3 Encounters:  01/16/16 176 lb 12.8 oz (80.2 kg)  07/26/15 176 lb 12.8 oz (80.2 kg)  07/18/15 181 lb (82.1 kg)     Past Medical History:  Diagnosis Date  . Coronary artery disease    ETT-Myoview EF 51%, inf infarct, no ischemia, low risk  . History of echocardiogram    Echo 3/17: EF 55-60%, inferior HK, grade 1 diastolic dysfunctione  . Hypertension     Past Surgical History:  Procedure Laterality Date  . KNEE SURGERY    . LEFT HEART CATHETERIZATION WITH CORONARY ANGIOGRAM N/A 07/01/2011   Procedure: LEFT HEART CATHETERIZATION WITH CORONARY ANGIOGRAM;  Surgeon: Candee Furbish, MD;  Location: [facility] CATH LAB;  Service: Cardiovascular;  Laterality: N/A;    Current Outpatient Prescriptions  Medication Sig Dispense Refill  . aspirin 81 MG chewable tablet Chew 81 mg by mouth daily.      Marland Kitchen atorvastatin (LIPITOR) 80 MG tablet Take 80  mg by mouth daily.      . beta carotene w/minerals (OCUVITE) tablet Take 1 tablet by mouth daily.    Marland Kitchen doxazosin (CARDURA) 8 MG tablet Take 8 mg by mouth at bedtime.      Marland Kitchen omeprazole (PRILOSEC) 40 MG capsule Take 40 mg by mouth daily.    . quinapril (ACCUPRIL) 10 MG tablet Take 10 mg by mouth at bedtime.      . quinapril (ACCUPRIL) 20 MG tablet Take 20 mg by mouth daily.      No current facility-administered medications for this visit.     Allergies:    Allergies  Allergen Reactions  . Aldomet [Methyldopa]     Rash and itching  . Plavix [Clopidogrel Bisulfate]     Rash and itching  . Aldactone [Spironolactone] Rash    Social History:  The patient  reports that he has never smoked. He has never used smokeless tobacco. He reports that he does not drink alcohol or use drugs.   Family History  Problem Relation Age of Onset  . Coronary artery disease Father   . Kidney disease Sister   . Hyperlipidemia Sister   . Heart disease Brother   . Diabetes Brother   . Coronary artery disease Brother   . Heart attack Sister     ROS:  Please see the history of present illness.   In general feels tired. Dr.  Little has checked a TSH, normal. He has had diarrhea, muscle pain, dizziness, snoring. Denies any fevers, chills, orthopnea, PND   All other systems reviewed and negative.   PHYSICAL EXAM: VS:  BP (!) 142/70   Pulse 78   Ht 5\' 5"  (1.651 m)   Wt 176 lb 12.8 oz (80.2 kg)   SpO2 96%   BMI 29.42 kg/m  Well nourished, well developed, in no acute distress  HEENT: normal, [place]/AT, EOMI Neck: no JVD, normal carotid upstroke, no bruit Cardiac:  normal S1, S2; RRR; no murmur  Lungs:  clear to auscultation bilaterally, no wheezing, rhonchi or rales  Abd: soft, nontender, no hepatomegaly, no bruits  Ext: no edema, 2+ distal pulses Skin: warm and dry  GU: deferred Neuro: no focal abnormalities noted, AAO x 3  EKG:  Today 01/15/15-sinus rhythm, 74, lateral infarct pattern, no significant  change. 01/11/14-sinus rhythm, 84 with PVC, old inferior infarct pattern.     ECHO: 08/12/15: - Hypokinesis of the basal inferior wall with overall preserved LV   systolic function; grade 1 diastolic dysfunction; trace MR and   TR. - Aortic root 21mm  NUC stress 08/12/15:  Nuclear stress EF: 51%.  There was no ST segment deviation noted during stress.  Findings consistent with prior myocardial infarction.  This is a low risk study.  The left ventricular ejection fraction is mildly decreased (45-54%).   Small inferior wall infarct from apex to base with no ischemia EF 51%      ASSESSMENT AND PLAN:  1. Dilated aortic root - 84mm 2010. 37 mm in 2015, 109mm in 2017-stable. We will contemplate repeating echocardiogram in the next couple years.  2. Coronary artery disease-stable. No angina. RCA stent. Patent in 2013. NUC 2017 reassuring, small inferior infarct, no ischemia.  3. Hypertension-well-controlled. No changes made. ACE inhibitor. No beta blocker because of fatigue. He has been on Cardura for years for his hypertension.He has had an occasional hypotensive episode in the distant past. 4. Hyperlipidemia-LDL 85 in 15-atorvastatin 80. Continue. Dr. Rex Kras has been monitoring his lipids. 5. Fatigue-continue to exercise. TSH is normal. 6. Vertigo-self-limiting. Feeling better. No recent significant issues. 7. One-year follow-up.  Signed, Candee Furbish, MD [facility]  01/16/2016 8:37 AM

## 2016-01-16 NOTE — Patient Instructions (Signed)
Medication Instructions:  Your physician recommends that you continue on your current medications as directed. Please refer to the Current Medication list given to you today.  Follow-Up: Your physician wants you to follow-up in: 1 year with Dr. Skains. You will receive a reminder letter in the mail two months in advance. If you don't receive a letter, please call our office to schedule the follow-up appointment.  If you need a refill on your cardiac medications before your next appointment, please call your pharmacy.   

## 2016-02-11 DIAGNOSIS — N200 Calculus of kidney: Secondary | ICD-10-CM | POA: Diagnosis not present

## 2016-02-11 DIAGNOSIS — N5201 Erectile dysfunction due to arterial insufficiency: Secondary | ICD-10-CM | POA: Diagnosis not present

## 2016-04-29 DIAGNOSIS — B029 Zoster without complications: Secondary | ICD-10-CM | POA: Diagnosis not present

## 2016-04-29 DIAGNOSIS — Z23 Encounter for immunization: Secondary | ICD-10-CM | POA: Diagnosis not present

## 2016-05-26 ENCOUNTER — Ambulatory Visit (INDEPENDENT_AMBULATORY_CARE_PROVIDER_SITE_OTHER): Payer: Medicare HMO | Admitting: Ophthalmology

## 2016-05-26 DIAGNOSIS — H43813 Vitreous degeneration, bilateral: Secondary | ICD-10-CM | POA: Diagnosis not present

## 2016-05-26 DIAGNOSIS — I10 Essential (primary) hypertension: Secondary | ICD-10-CM | POA: Diagnosis not present

## 2016-05-26 DIAGNOSIS — H35372 Puckering of macula, left eye: Secondary | ICD-10-CM | POA: Diagnosis not present

## 2016-05-26 DIAGNOSIS — H35033 Hypertensive retinopathy, bilateral: Secondary | ICD-10-CM

## 2016-05-26 DIAGNOSIS — H353131 Nonexudative age-related macular degeneration, bilateral, early dry stage: Secondary | ICD-10-CM

## 2016-05-26 DIAGNOSIS — H33302 Unspecified retinal break, left eye: Secondary | ICD-10-CM

## 2016-07-20 DIAGNOSIS — J301 Allergic rhinitis due to pollen: Secondary | ICD-10-CM | POA: Diagnosis not present

## 2016-09-22 DIAGNOSIS — L03311 Cellulitis of abdominal wall: Secondary | ICD-10-CM | POA: Diagnosis not present

## 2016-09-22 DIAGNOSIS — R0789 Other chest pain: Secondary | ICD-10-CM | POA: Diagnosis not present

## 2016-11-26 DIAGNOSIS — Z125 Encounter for screening for malignant neoplasm of prostate: Secondary | ICD-10-CM | POA: Diagnosis not present

## 2016-11-26 DIAGNOSIS — I10 Essential (primary) hypertension: Secondary | ICD-10-CM | POA: Diagnosis not present

## 2016-11-26 DIAGNOSIS — H3530 Unspecified macular degeneration: Secondary | ICD-10-CM | POA: Diagnosis not present

## 2016-11-26 DIAGNOSIS — K210 Gastro-esophageal reflux disease with esophagitis: Secondary | ICD-10-CM | POA: Diagnosis not present

## 2016-11-26 DIAGNOSIS — N189 Chronic kidney disease, unspecified: Secondary | ICD-10-CM | POA: Diagnosis not present

## 2016-11-26 DIAGNOSIS — Z87442 Personal history of urinary calculi: Secondary | ICD-10-CM | POA: Diagnosis not present

## 2016-11-26 DIAGNOSIS — E7800 Pure hypercholesterolemia, unspecified: Secondary | ICD-10-CM | POA: Diagnosis not present

## 2016-11-26 DIAGNOSIS — Z0000 Encounter for general adult medical examination without abnormal findings: Secondary | ICD-10-CM | POA: Diagnosis not present

## 2016-11-26 DIAGNOSIS — I25119 Atherosclerotic heart disease of native coronary artery with unspecified angina pectoris: Secondary | ICD-10-CM | POA: Diagnosis not present

## 2016-12-17 ENCOUNTER — Encounter: Payer: Self-pay | Admitting: Cardiology

## 2016-12-17 ENCOUNTER — Ambulatory Visit (INDEPENDENT_AMBULATORY_CARE_PROVIDER_SITE_OTHER): Payer: Medicare HMO | Admitting: Cardiology

## 2016-12-17 VITALS — BP 130/72 | HR 54 | Ht 65.5 in | Wt 177.2 lb

## 2016-12-17 DIAGNOSIS — I10 Essential (primary) hypertension: Secondary | ICD-10-CM | POA: Diagnosis not present

## 2016-12-17 DIAGNOSIS — I77810 Thoracic aortic ectasia: Secondary | ICD-10-CM

## 2016-12-17 DIAGNOSIS — I2510 Atherosclerotic heart disease of native coronary artery without angina pectoris: Secondary | ICD-10-CM | POA: Diagnosis not present

## 2016-12-17 MED ORDER — QUINAPRIL HCL 20 MG PO TABS: 30.0000 mg | ORAL_TABLET | Freq: Every day | ORAL | 11 refills | Status: DC

## 2016-12-17 NOTE — Progress Notes (Signed)
[place]. [address], [address], [place]  [address] Phone: [phone] Fax:  [phone]  Date:  12/17/2016   ID:  Bruce [name], DOB July 18, 1935, MRN [id number]  PCP:  Hulan Fess, MD   History of Present Illness: Bruce [name] is a 81 y.o. male male with hx double vessel coronary artery disease here for annual followup.   Stent to the RCA in 2002, mild to moderate LAD stenosis 40% diffuse proximal and mid.   Stress test in 2007 showed mild peri-infarct ischemia in the basal inferior and mid inferior region as well as basal inferolateral region. Normal EF.   He was having anginal like chest pain and I proceeded with cardiac cath 2013 with noted patent stent and EF 50%.   In early 2017 underwent both an echocardiogram and a stress test that showed no ischemia. Small inferior infarct pattern.  Overall is been feeling well without any cardiac complaints, no chest pain, no shortness of breath, no syncope.  Wt Readings from Last 3 Encounters:  12/17/16 177 lb 3.2 oz (80.4 kg)  01/16/16 176 lb 12.8 oz (80.2 kg)  07/26/15 176 lb 12.8 oz (80.2 kg)     Past Medical History:  Diagnosis Date  . Coronary artery disease    ETT-Myoview EF 51%, inf infarct, no ischemia, low risk  . History of echocardiogram    Echo 3/17: EF 55-60%, inferior HK, grade 1 diastolic dysfunctione  . Hypertension     Past Surgical History:  Procedure Laterality Date  . KNEE SURGERY    . LEFT HEART CATHETERIZATION WITH CORONARY ANGIOGRAM N/A 07/01/2011   Procedure: LEFT HEART CATHETERIZATION WITH CORONARY ANGIOGRAM;  Surgeon: Candee Furbish, MD;  Location: [facility] CATH LAB;  Service: Cardiovascular;  Laterality: N/A;    Current Outpatient Prescriptions  Medication Sig Dispense Refill  . aspirin 81 MG chewable tablet Chew 81 mg by mouth daily.      Marland Kitchen atorvastatin (LIPITOR) 80 MG tablet Take 80 mg by mouth daily.      . beta carotene w/minerals (OCUVITE) tablet Take 1 tablet by mouth daily.    Marland Kitchen  doxazosin (CARDURA) 8 MG tablet Take 8 mg by mouth at bedtime.      Marland Kitchen omeprazole (PRILOSEC) 40 MG capsule Take 40 mg by mouth daily.    . quinapril (ACCUPRIL) 20 MG tablet Take 1.5 tablets (30 mg total) by mouth daily. 45 tablet 11   No current facility-administered medications for this visit.     Allergies:    Allergies  Allergen Reactions  . Aldomet [Methyldopa]     Rash and itching  . Plavix [Clopidogrel Bisulfate]     Rash and itching  . Aldactone [Spironolactone] Rash    Social History:  The patient  reports that he has never smoked. He has never used smokeless tobacco. He reports that he does not drink alcohol or use drugs.   Family History  Problem Relation Age of Onset  . Coronary artery disease Father   . Kidney disease Sister   . Hyperlipidemia Sister   . Heart disease Brother   . Diabetes Brother   . Coronary artery disease Brother   . Heart attack Sister     ROS:  Overall no significant shortness of breath, no chest pain, no shortness of breath, no orthopnea no PND, no bleeding.  PHYSICAL EXAM: VS:  BP 130/72   Pulse (!) 54   Ht 5' 5.5" (1.664 m)   Wt 177 lb  3.2 oz (80.4 kg)   BMI 29.04 kg/m  GEN: Well nourished, well developed, in no acute distress  HEENT: normal  Neck: no JVD, carotid bruits, or masses Cardiac: RRR; no murmurs, rubs, or gallops,no edema  Respiratory:  clear to auscultation bilaterally, normal work of breathing GI: soft, nontender, nondistended, + BS MS: no deformity or atrophy  Skin: warm and dry, no rash Neuro:  Alert and Oriented x 3, Strength and sensation are intact Psych: euthymic mood, full affect  EKG:  Today 12/17/16-sinus bradycardia rate 54. Inf infarct pattern. 01/15/15-sinus rhythm, 74, lateral infarct pattern, no significant change. 01/11/14-sinus rhythm, 84 with PVC, old inferior infarct pattern.     ECHO: 08/12/15: - Hypokinesis of the basal inferior wall with overall preserved LV   systolic function; grade 1 diastolic  dysfunction; trace MR and   TR. - Aortic root 64mm  NUC stress 08/12/15:  Nuclear stress EF: 51%.  There was no ST segment deviation noted during stress.  Findings consistent with prior myocardial infarction.  This is a low risk study.  The left ventricular ejection fraction is mildly decreased (45-54%).   Small inferior wall infarct from apex to base with no ischemia EF 51%      ASSESSMENT AND PLAN:  1. Dilated aortic root - 24mm 2010. 37 mm in 2015, 36mm in 2017-stable. We will contemplate repeating echocardiogram in the next couple years. Overall doing well. 2. Coronary artery disease-stable. No angina. RCA stent. Patent in 2013. NUC 2017 reassuring, small inferior infarct, no ischemia. No anginal symptoms. 3. Hypertension-well-controlled. No changes made. ACE inhibitor. No beta blocker because of fatigue. He has been on Cardura for years for his hypertension. He has also been on quinapril 20 mg tablet we believe one half tablets a day. He has had an occasional hypotensive episode in the distant past. 4. Hyperlipidemia-LDL 69 atorvastatin 80. Continue. Dr. Rex Kras has been monitoring his lipids. Reviewed most recent labs. 5. Fatigue-continue to exercise. TSH is normal. 6. One-year follow-up.  Signed, Candee Furbish, MD [facility]  12/17/2016 5:01 PM

## 2016-12-17 NOTE — Patient Instructions (Addendum)
Medication Instructions:  The current medical regimen is effective;  continue present plan and medications.  Follow-Up: Follow up in 1 year with Dr. Marlou Porch.  You will receive a letter in the mail 2 months before you are due.  Please call us when you receive this letter to schedule your follow up appointment.  If you need a refill on your cardiac medications before your next appointment, please call your pharmacy.  Thank you for choosing [facility]!!     Pam - Dr Marlou Porch' nurse

## 2016-12-22 ENCOUNTER — Telehealth: Payer: Self-pay | Admitting: *Deleted

## 2016-12-22 NOTE — Telephone Encounter (Signed)
Pt called in to verify Quinapril dose which is 20 mg tablet 1.5 tablets a day.

## 2017-01-01 DIAGNOSIS — Z1283 Encounter for screening for malignant neoplasm of skin: Secondary | ICD-10-CM | POA: Diagnosis not present

## 2017-01-01 DIAGNOSIS — L02224 Furuncle of groin: Secondary | ICD-10-CM | POA: Diagnosis not present

## 2017-01-01 DIAGNOSIS — X32XXXD Exposure to sunlight, subsequent encounter: Secondary | ICD-10-CM | POA: Diagnosis not present

## 2017-01-01 DIAGNOSIS — L570 Actinic keratosis: Secondary | ICD-10-CM | POA: Diagnosis not present

## 2017-01-01 DIAGNOSIS — B9689 Other specified bacterial agents as the cause of diseases classified elsewhere: Secondary | ICD-10-CM | POA: Diagnosis not present

## 2017-02-09 DIAGNOSIS — N5201 Erectile dysfunction due to arterial insufficiency: Secondary | ICD-10-CM | POA: Diagnosis not present

## 2017-02-09 DIAGNOSIS — N400 Enlarged prostate without lower urinary tract symptoms: Secondary | ICD-10-CM | POA: Diagnosis not present

## 2017-02-09 DIAGNOSIS — Z125 Encounter for screening for malignant neoplasm of prostate: Secondary | ICD-10-CM | POA: Diagnosis not present

## 2017-02-11 DIAGNOSIS — Z23 Encounter for immunization: Secondary | ICD-10-CM | POA: Diagnosis not present

## 2017-05-03 DIAGNOSIS — M25562 Pain in left knee: Secondary | ICD-10-CM | POA: Diagnosis not present

## 2017-05-27 ENCOUNTER — Ambulatory Visit (INDEPENDENT_AMBULATORY_CARE_PROVIDER_SITE_OTHER): Payer: Medicare HMO | Admitting: Ophthalmology

## 2017-05-27 DIAGNOSIS — H33302 Unspecified retinal break, left eye: Secondary | ICD-10-CM

## 2017-05-27 DIAGNOSIS — H43813 Vitreous degeneration, bilateral: Secondary | ICD-10-CM

## 2017-05-27 DIAGNOSIS — H353131 Nonexudative age-related macular degeneration, bilateral, early dry stage: Secondary | ICD-10-CM | POA: Diagnosis not present

## 2017-05-27 DIAGNOSIS — H35372 Puckering of macula, left eye: Secondary | ICD-10-CM

## 2017-05-27 DIAGNOSIS — I10 Essential (primary) hypertension: Secondary | ICD-10-CM | POA: Diagnosis not present

## 2017-05-27 DIAGNOSIS — H35033 Hypertensive retinopathy, bilateral: Secondary | ICD-10-CM | POA: Diagnosis not present

## 2017-06-07 DIAGNOSIS — M170 Bilateral primary osteoarthritis of knee: Secondary | ICD-10-CM | POA: Diagnosis not present

## 2017-06-07 DIAGNOSIS — M25562 Pain in left knee: Secondary | ICD-10-CM | POA: Diagnosis not present

## 2017-06-07 DIAGNOSIS — M1712 Unilateral primary osteoarthritis, left knee: Secondary | ICD-10-CM | POA: Diagnosis not present

## 2017-06-07 DIAGNOSIS — M1711 Unilateral primary osteoarthritis, right knee: Secondary | ICD-10-CM | POA: Diagnosis not present

## 2017-06-14 DIAGNOSIS — M25562 Pain in left knee: Secondary | ICD-10-CM | POA: Diagnosis not present

## 2017-07-01 DIAGNOSIS — M25562 Pain in left knee: Secondary | ICD-10-CM | POA: Diagnosis not present

## 2017-07-01 DIAGNOSIS — M1712 Unilateral primary osteoarthritis, left knee: Secondary | ICD-10-CM | POA: Diagnosis not present

## 2017-07-07 DIAGNOSIS — M25569 Pain in unspecified knee: Secondary | ICD-10-CM | POA: Diagnosis not present

## 2017-07-07 DIAGNOSIS — I25119 Atherosclerotic heart disease of native coronary artery with unspecified angina pectoris: Secondary | ICD-10-CM | POA: Diagnosis not present

## 2017-07-07 DIAGNOSIS — R05 Cough: Secondary | ICD-10-CM | POA: Diagnosis not present

## 2017-07-19 NOTE — Progress Notes (Signed)
Please place orders in Epic as patient is being scheduled for a pre-op appointment! Thank you! 

## 2017-07-20 NOTE — Progress Notes (Signed)
Please place orders in Epic as patient has a pre-op appointment on 07/26/2017! Thank you!

## 2017-07-20 NOTE — Progress Notes (Signed)
07/07/2017- Medical Clearance from Dr. Hulan Fess on chart with office note.  12/17/2016-noted in [place]  08/11/2016- noted in Epic-ECHO and STRESS

## 2017-07-22 DIAGNOSIS — R05 Cough: Secondary | ICD-10-CM | POA: Diagnosis not present

## 2017-07-22 DIAGNOSIS — R0781 Pleurodynia: Secondary | ICD-10-CM | POA: Diagnosis not present

## 2017-07-26 ENCOUNTER — Encounter (HOSPITAL_COMMUNITY): Payer: Self-pay

## 2017-07-26 ENCOUNTER — Encounter (HOSPITAL_COMMUNITY)
Admission: RE | Admit: 2017-07-26 | Discharge: 2017-07-26 | Disposition: A | Discharge status: Home / Self Care | Payer: Medicare HMO | Source: Ambulatory Visit | Attending: Orthopedic Surgery | Admitting: Orthopedic Surgery

## 2017-07-26 ENCOUNTER — Other Ambulatory Visit: Payer: Self-pay

## 2017-07-26 DIAGNOSIS — K006 Disturbances in tooth eruption: Secondary | ICD-10-CM | POA: Diagnosis not present

## 2017-07-26 DIAGNOSIS — R69 Illness, unspecified: Secondary | ICD-10-CM | POA: Diagnosis not present

## 2017-07-26 DIAGNOSIS — Z01818 Encounter for other preprocedural examination: Secondary | ICD-10-CM | POA: Insufficient documentation

## 2017-07-26 DIAGNOSIS — M1712 Unilateral primary osteoarthritis, left knee: Secondary | ICD-10-CM | POA: Insufficient documentation

## 2017-07-26 HISTORY — DX: Unspecified osteoarthritis, unspecified site: M19.90

## 2017-07-26 HISTORY — DX: Personal history of urinary calculi: Z87.442

## 2017-07-26 HISTORY — DX: Acute myocardial infarction, unspecified: I21.9

## 2017-07-26 LAB — ABO/RH: ABO/RH(D): A NEG

## 2017-07-26 LAB — BASIC METABOLIC PANEL
ANION GAP: 8 (ref 5–15)
BUN: 22 mg/dL — ABNORMAL HIGH (ref 6–20)
CALCIUM: 9 mg/dL (ref 8.9–10.3)
CO2: 23 mmol/L (ref 22–32)
Chloride: 108 mmol/L (ref 101–111)
Creatinine, Ser: 1.32 mg/dL — ABNORMAL HIGH (ref 0.61–1.24)
GFR, EST AFRICAN AMERICAN: 58 mL/min — AB (ref >60)
GFR, EST NON AFRICAN AMERICAN: 50 mL/min — AB (ref >60)
Glucose, Bld: 103 mg/dL — ABNORMAL HIGH (ref 65–99)
Potassium: 4.4 mmol/L (ref 3.5–5.1)
Sodium: 139 mmol/L (ref 135–145)

## 2017-07-26 LAB — SURGICAL PCR SCREEN
MRSA, PCR: NEGATIVE
Staphylococcus aureus: NEGATIVE

## 2017-07-26 LAB — CBC
HCT: 45.2 % (ref 39.0–52.0)
HEMOGLOBIN: 15.1 g/dL (ref 13.0–17.0)
MCH: 32.1 pg (ref 26.0–34.0)
MCHC: 33.4 g/dL (ref 30.0–36.0)
MCV: 96 fL (ref 78.0–100.0)
PLATELETS: 149 10*3/uL — AB (ref 150–400)
RBC: 4.71 MIL/uL (ref 4.22–5.81)
RDW: 13.7 % (ref 11.5–15.5)
WBC: 5.4 10*3/uL (ref 4.0–10.5)

## 2017-07-26 NOTE — Progress Notes (Signed)
07/22/2017- Office note from Dr. Hulan Fess

## 2017-07-26 NOTE — H&P (Signed)
UNICOMPARTMENTAL KNEE ADMISSION H&P  Patient is being admitted for left medial unicompartmental knee arthroplasty.  Subjective:  Chief Complaint:  Left knee medial compartmental primary OA /pain    HPI: Bruce [name], 82 y.o. male , has a history of pain and functional disability in the left and has failed non-surgical conservative treatments for greater than 12 weeks to include NSAID's and/or analgesics, corticosteriod injections and activity modification.  Onset of symptoms was gradual, starting ~1 years ago with gradually worsening course since that time. The patient noted prior procedures on the knee to include  arthroscopy on the left knee(s).  Patient currently rates pain in the left knee(s) at 9 out of 10 with activity. Patient has night pain, worsening of pain with activity and weight bearing, pain that interferes with activities of daily living, pain with passive range of motion, crepitus and joint swelling.  Patient has evidence of periarticular osteophytes and joint space narrowing of the medial compartment by imaging studies.  There is no active infection.  Risks, benefits and expectations were discussed with the patient.  Risks including but not limited to the risk of anesthesia, blood clots, nerve damage, blood vessel damage, failure of the prosthesis, infection and up to and including death.  Patient understand the risks, benefits and expectations and wishes to proceed with surgery.   PCP: Hulan Fess, MD  D/C Plans:       Home  Post-op Meds:       No Rx given   Tranexamic Acid:      To be given - IV   Decadron:      Is to be given  FYI:      ASA  Norco  NO Celebrex (kidney function)  DME:   Pt already has equipment   PT:   OPPT Rx given    Past Medical History:  Diagnosis Date  . Arthritis   . Coronary artery disease    ETT-Myoview EF 51%, inf infarct, no ischemia, low risk  . History of echocardiogram    Echo 3/17: EF 55-60%, inferior HK, grade 1  diastolic dysfunctione  . History of kidney stones   . Hypertension   . Myocardial infarction [facility]) 2000   with stent     Past Surgical History:  Procedure Laterality Date  . CORONARY ANGIOPLASTY    . EYE SURGERY     bilateral cataract surgery with lens implant  . KNEE SURGERY    . LEFT HEART CATHETERIZATION WITH CORONARY ANGIOGRAM N/A 07/01/2011   Procedure: LEFT HEART CATHETERIZATION WITH CORONARY ANGIOGRAM;  Surgeon: Candee Furbish, MD;  Location: [facility] CATH LAB;  Service: Cardiovascular;  Laterality: N/A;  . left wrist surgery     left wrist fracture    Allergies  Allergen Reactions  . Aldomet [Methyldopa]     Rash and itching  . Plavix [Clopidogrel Bisulfate]     Rash and itching  . Aldactone [Spironolactone] Rash    Social History   Tobacco Use  . Smoking status: Never Smoker  . Smokeless tobacco: Never Used  Substance Use Topics  . Alcohol use: No  . Drug use: No    Family History  Problem Relation Age of Onset  . Coronary artery disease Father   . Kidney disease Sister   . Hyperlipidemia Sister   . Heart disease Brother   . Diabetes Brother   . Coronary artery disease Brother   . Heart attack Sister      Review of Systems  Constitutional: Negative.   HENT: Negative.   Eyes: Negative.   Respiratory: Positive for cough.   Cardiovascular: Negative.   Gastrointestinal: Positive for heartburn.  Genitourinary: Negative.   Musculoskeletal: Positive for joint pain.  Skin: Negative.   Neurological: Negative.   Endo/Heme/Allergies: Negative.   Psychiatric/Behavioral: Negative.      Objective:   Physical Exam  Constitutional: He is well-developed, well-nourished, and in no distress.  HENT:  Head: Normocephalic.  Mouth/Throat: He has dentures (partial).  Eyes: Pupils are equal, round, and reactive to light.  Neck: Neck supple. No JVD present. No tracheal deviation present. No thyromegaly present.  Cardiovascular: Normal rate and intact distal pulses.    Pulmonary/Chest: Effort normal and breath sounds normal. No respiratory distress. He has no wheezes.  Abdominal: Soft. There is no tenderness. There is no guarding.  Musculoskeletal:       Left knee: He exhibits decreased range of motion, swelling and bony tenderness. He exhibits no ecchymosis, no deformity, no laceration and no erythema. Tenderness found. Medial joint line tenderness noted. No lateral joint line tenderness noted.  Lymphadenopathy:    He has no cervical adenopathy.  Neurological: He is alert.  Skin: Skin is warm and dry.       Labs:  Estimated body mass index is 29.09 kg/m as calculated from the following:   Height as of 07/26/17: 5\' 5"  (1.651 m).   Weight as of 07/26/17: 79.3 kg (174 lb 12.8 oz).   Imaging Review Plain radiographs demonstrate severe degenerative joint disease of the left knee(s) medial compartment. The overall alignment is neutral. The bone quality appears to be good for age and reported activity level.  Assessment/Plan:  End stage arthritis, left knee medial compartment  The patient history, physical examination, clinical judgment of the provider and imaging studies are consistent with end stage degenerative joint disease of the left knee(s) and medial unicompartmental knee arthroplasty is deemed medically necessary. The treatment options including medical management, injection therapy arthroscopy and arthroplasty were discussed at length. The risks and benefits of total knee arthroplasty were presented and reviewed. The risks due to aseptic loosening, infection, stiffness, patella tracking problems, thromboembolic complications and other imponderables were discussed. The patient acknowledged the explanation, agreed to proceed with the plan and consent was signed. Patient is being admitted for outpatient / observation treatment for surgery, pain control, PT, OT, prophylactic antibiotics, VTE prophylaxis, progressive ambulation and ADL's and discharge  planning. The patient is planning to be discharged home.      [place] Delitha Elms   PA-C  07/26/2017, 3:33 PM

## 2017-07-26 NOTE — Patient Instructions (Signed)
Nori R Hamed  07/26/2017   Your procedure is scheduled on: Monday 08/02/2017  Report to [facility] Main  Entrance   Report to admitting at   0730AM    Call this number if you have problems the morning of surgery [phone]    Remember: Do not eat food or drink liquids :After Midnight.     Take these medicines the morning of surgery with A SIP OF WATER: Doxazosin (Cardura), Omeprazole (Prilosec), Atorvastatin (Lipitor), use eye drops if needed                                You may not have any metal on your body including hair pins and              piercings  Do not wear jewelry, make-up, lotions, powders or perfumes, deodorant             Do not wear nail polish.  Do not shave  48 hours prior to surgery.              Men may shave face and neck.   Do not bring valuables to the hospital. [place].  Contacts, dentures or bridgework may not be worn into surgery.  Leave suitcase in the car. After surgery it may be brought to your room.                  Please read over the following fact sheets you were given: _____________________________________________________________________             [facility] - Preparing for Surgery Before surgery, you can play an important role.  Because skin is not sterile, your skin needs to be as free of germs as possible.  You can reduce the number of germs on your skin by washing with CHG (chlorahexidine gluconate) soap before surgery.  CHG is an antiseptic cleaner which kills germs and bonds with the skin to continue killing germs even after washing. Please DO NOT use if you have an allergy to CHG or antibacterial soaps.  If your skin becomes reddened/irritated stop using the CHG and inform your nurse when you arrive at Short Stay. Do not shave (including legs and underarms) for at least 48 hours prior to the first CHG shower.  You may shave your face/neck. Please follow  these instructions carefully:  1.  Shower with CHG Soap the night before surgery and the  morning of Surgery.  2.  If you choose to wash your hair, wash your hair first as usual with your  normal  shampoo.  3.  After you shampoo, rinse your hair and body thoroughly to remove the  shampoo.                           4.  Use CHG as you would any other liquid soap.  You can apply chg directly  to the skin and wash                       Gently with a scrungie or clean washcloth.  5.  Apply the CHG Soap to your body ONLY FROM THE NECK DOWN.   Do  not use on face/ open                           Wound or open sores. Avoid contact with eyes, ears mouth and genitals (private parts).                       Wash face,  Genitals (private parts) with your normal soap.             6.  Wash thoroughly, paying special attention to the area where your surgery  will be performed.  7.  Thoroughly rinse your body with warm water from the neck down.  8.  DO NOT shower/wash with your normal soap after using and rinsing off  the CHG Soap.                9.  Pat yourself dry with a clean towel.            10.  Wear clean pajamas.            11.  Place clean sheets on your bed the night of your first shower and do not  sleep with pets. Day of Surgery : Do not apply any lotions/deodorants the morning of surgery.  Please wear clean clothes to the hospital/surgery center.  FAILURE TO FOLLOW THESE INSTRUCTIONS MAY RESULT IN THE CANCELLATION OF YOUR SURGERY PATIENT SIGNATURE_________________________________  NURSE SIGNATURE__________________________________  ________________________________________________________________________   [name]  An incentive spirometer is a tool that can help keep your lungs clear and active. This tool measures how well you are filling your lungs with each breath. Taking long deep breaths may help reverse or decrease the chance of developing breathing (pulmonary) problems  (especially infection) following:  A long period of time when you are unable to move or be active. BEFORE THE PROCEDURE   If the spirometer includes an indicator to show your best effort, your nurse or respiratory therapist will set it to a desired goal.  If possible, sit up straight or lean slightly forward. Try not to slouch.  Hold the incentive spirometer in an upright position. INSTRUCTIONS FOR USE  1. Sit on the edge of your bed if possible, or sit up as far as you can in bed or on a chair. 2. Hold the incentive spirometer in an upright position. 3. Breathe out normally. 4. Place the mouthpiece in your mouth and seal your lips tightly around it. 5. Breathe in slowly and as deeply as possible, raising the piston or the Avino toward the top of the column. 6. Hold your breath for 3-5 seconds or for as long as possible. Allow the piston or Hitchman to fall to the bottom of the column. 7. Remove the mouthpiece from your mouth and breathe out normally. 8. Rest for a few seconds and repeat Steps 1 through 7 at least 10 times every 1-2 hours when you are awake. Take your time and take a few normal breaths between deep breaths. 9. The spirometer may include an indicator to show your best effort. Use the indicator as a goal to work toward during each repetition. 10. After each set of 10 deep breaths, practice coughing to be sure your lungs are clear. If you have an incision (the cut made at the time of surgery), support your incision when coughing by placing a pillow or rolled up towels firmly against it. Once you are able to get out of bed,  walk around indoors and cough well. You may stop using the incentive spirometer when instructed by your caregiver.  RISKS AND COMPLICATIONS  Take your time so you do not get dizzy or light-headed.  If you are in pain, you may need to take or ask for pain medication before doing incentive spirometry. It is harder to take a deep breath if you are having  pain. AFTER USE  Rest and breathe slowly and easily.  It can be helpful to keep track of a log of your progress. Your caregiver can provide you with a simple table to help with this. If you are using the spirometer at home, follow these instructions: [place] IF:   You are having difficultly using the spirometer.  You have trouble using the spirometer as often as instructed.  Your pain medication is not giving enough relief while using the spirometer.  You develop fever of 100.5 F (38.1 C) or higher. SEEK IMMEDIATE MEDICAL CARE IF:   You cough up bloody sputum that had not been present before.  You develop fever of 102 F (38.9 C) or greater.  You develop worsening pain at or near the incision site. MAKE SURE YOU:   Understand these instructions.  Will watch your condition.  Will get help right away if you are not doing well or get worse. Document Released: 09/14/2006 Document Revised: 07/27/2011 Document Reviewed: 11/15/2006 Exit[employer] Patient Information 2014 Exit[employer], [place].   ________________________________________________________________________  WHAT IS A BLOOD TRANSFUSION? Blood Transfusion Information  A transfusion is the replacement of blood or some of its parts. Blood is made up of multiple cells which provide different functions.  Red blood cells carry oxygen and are used for blood loss replacement.  White blood cells fight against infection.  Platelets control bleeding.  Plasma helps clot blood.  Other blood products are available for specialized needs, such as hemophilia or other clotting disorders. BEFORE THE TRANSFUSION  Who gives blood for transfusions?   Healthy volunteers who are fully evaluated to make sure their blood is safe. This is blood bank blood. Transfusion therapy is the safest it has ever been in the practice of medicine. Before blood is taken from a donor, a complete history is taken to make sure that person has no history  of diseases nor engages in risky social behavior (examples are intravenous drug use or sexual activity with multiple partners). The donor's travel history is screened to minimize risk of transmitting infections, such as malaria. The donated blood is tested for signs of infectious diseases, such as HIV and hepatitis. The blood is then tested to be sure it is compatible with you in order to minimize the chance of a transfusion reaction. If you or a relative donates blood, this is often done in anticipation of surgery and is not appropriate for emergency situations. It takes many days to process the donated blood. RISKS AND COMPLICATIONS Although transfusion therapy is very safe and saves many lives, the main dangers of transfusion include:   Getting an infectious disease.  Developing a transfusion reaction. This is an allergic reaction to something in the blood you were given. Every precaution is taken to prevent this. The decision to have a blood transfusion has been considered carefully by your caregiver before blood is given. Blood is not given unless the benefits outweigh the risks. AFTER THE TRANSFUSION  Right after receiving a blood transfusion, you will usually feel much better and more energetic. This is especially true if your red blood cells  have gotten low (anemic). The transfusion raises the level of the red blood cells which carry oxygen, and this usually causes an energy increase.  The nurse administering the transfusion will monitor you carefully for complications. HOME CARE INSTRUCTIONS  No special instructions are needed after a transfusion. You may find your energy is better. Speak with your caregiver about any limitations on activity for underlying diseases you may have. SEEK MEDICAL CARE IF:   Your condition is not improving after your transfusion.  You develop redness or irritation at the intravenous (IV) site. SEEK IMMEDIATE MEDICAL CARE IF:  Any of the following symptoms  occur over the next 12 hours:  Shaking chills.  You have a temperature by mouth above 102 F (38.9 C), not controlled by medicine.  Chest, back, or muscle pain.  People around you feel you are not acting correctly or are confused.  Shortness of breath or difficulty breathing.  Dizziness and fainting.  You get a rash or develop hives.  You have a decrease in urine output.  Your urine turns a dark color or changes to pink, red, or brown. Any of the following symptoms occur over the next 10 days:  You have a temperature by mouth above 102 F (38.9 C), not controlled by medicine.  Shortness of breath.  Weakness after normal activity.  The white part of the eye turns yellow (jaundice).  You have a decrease in the amount of urine or are urinating less often.  Your urine turns a dark color or changes to pink, red, or brown. Document Released: 05/01/2000 Document Revised: 07/27/2011 Document Reviewed: 12/19/2007 [facility] Patient Information 2014 [place], [place].  _______________________________________________________________________

## 2017-07-27 ENCOUNTER — Encounter: Payer: Self-pay | Admitting: Cardiology

## 2017-07-27 ENCOUNTER — Ambulatory Visit: Payer: Medicare HMO | Admitting: Cardiology

## 2017-07-27 VITALS — BP 128/72 | HR 83 | Ht 65.0 in | Wt 175.0 lb

## 2017-07-27 DIAGNOSIS — I2510 Atherosclerotic heart disease of native coronary artery without angina pectoris: Secondary | ICD-10-CM | POA: Diagnosis not present

## 2017-07-27 DIAGNOSIS — Z01810 Encounter for preprocedural cardiovascular examination: Secondary | ICD-10-CM

## 2017-07-27 DIAGNOSIS — I10 Essential (primary) hypertension: Secondary | ICD-10-CM

## 2017-07-27 DIAGNOSIS — I77810 Thoracic aortic ectasia: Secondary | ICD-10-CM

## 2017-07-27 NOTE — Patient Instructions (Addendum)
Medication Instructions:  The current medical regimen is effective;  continue present plan and medications.  Follow-Up: Follow up in 1 year with Dr. Marlou Porch.  You will receive a letter in the mail 2 months before you are due.  Please call us when you receive this letter to schedule your follow up appointment.  If you need a refill on your cardiac medications before your next appointment, please call your pharmacy.  Thank you for choosing [facility]!!    You may proceed with surgery.

## 2017-07-27 NOTE — Progress Notes (Signed)
[place]. [address], [address], [place]  [address] Phone: [phone] Fax:  [phone]  Date:  07/27/2017   ID:  Bruce [name], DOB 1935-08-12, MRN [id number]  PCP:  Hulan Fess, MD   History of Present Illness: Bruce [name] is a 82 y.o. male male with hx double vessel coronary artery disease here for annual follow up and surgical clearance.   Stent to the RCA in 2002, mild to moderate LAD stenosis 40% diffuse proximal and mid.   Stress test in 2007 showed mild peri-infarct ischemia in the basal inferior and mid inferior region as well as basal inferolateral region. Normal EF.   He was having anginal like chest pain and I proceeded with cardiac cath 2013 with noted patent stent and EF 50%.   In early 2017 underwent both an echocardiogram and a stress test that showed no ischemia. Small inferior infarct pattern.  Overall is been feeling well without any cardiac complaints, no chest pain, no shortness of breath, no syncope.  Wt Readings from Last 3 Encounters:  07/27/17 175 lb (79.4 kg)  07/26/17 174 lb 12.8 oz (79.3 kg)  12/17/16 177 lb 3.2 oz (80.4 kg)     Past Medical History:  Diagnosis Date  . Arthritis   . Coronary artery disease    ETT-Myoview EF 51%, inf infarct, no ischemia, low risk  . History of echocardiogram    Echo 3/17: EF 55-60%, inferior HK, grade 1 diastolic dysfunctione  . History of kidney stones   . Hypertension   . Myocardial infarction [facility]) 2000   with stent    Past Surgical History:  Procedure Laterality Date  . CORONARY ANGIOPLASTY    . EYE SURGERY     bilateral cataract surgery with lens implant  . KNEE SURGERY    . LEFT HEART CATHETERIZATION WITH CORONARY ANGIOGRAM N/A 07/01/2011   Procedure: LEFT HEART CATHETERIZATION WITH CORONARY ANGIOGRAM;  Surgeon: Candee Furbish, MD;  Location: [facility] CATH LAB;  Service: Cardiovascular;  Laterality: N/A;  . left wrist surgery     left wrist fracture    Current Outpatient Medications    Medication Sig Dispense Refill  . acetaminophen (TYLENOL) 500 MG tablet Take 500 mg by mouth daily as needed for headache.    Marland Kitchen aspirin EC 81 MG tablet Take 81 mg by mouth daily.    Marland Kitchen atorvastatin (LIPITOR) 80 MG tablet Take 80 mg by mouth daily.      . beta carotene w/minerals (OCUVITE) tablet Take 1 tablet by mouth daily.    . Carboxymethylcellul-Glycerin (LUBRICATING EYE DROPS OP) Apply 1 drop to eye daily as needed (irritation).    Marland Kitchen doxazosin (CARDURA) 8 MG tablet Take 8 mg by mouth at bedtime.      Marland Kitchen levofloxacin (LEVAQUIN) 500 MG tablet Take 500 mg by mouth daily. Will finish medication on Thursday 07/29/2017    . Multiple Vitamin (MULTIVITAMIN WITH MINERALS) TABS tablet Take 1 tablet by mouth daily.    Marland Kitchen omeprazole (PRILOSEC) 40 MG capsule Take 40 mg by mouth daily.    . quinapril (ACCUPRIL) 20 MG tablet Take 1.5 tablets (30 mg total) by mouth daily. 45 tablet 11   No current facility-administered medications for this visit.     Allergies:    Allergies  Allergen Reactions  . Aldomet [Methyldopa]     Rash and itching  . Plavix [Clopidogrel Bisulfate]     Rash and itching  . Aldactone [Spironolactone] Rash  Social History:  The patient  reports that  has never smoked. he has never used smokeless tobacco. He reports that he does not drink alcohol or use drugs.   Family History  Problem Relation Age of Onset  . Coronary artery disease Father   . Kidney disease Sister   . Hyperlipidemia Sister   . Heart disease Brother   . Diabetes Brother   . Coronary artery disease Brother   . Heart attack Sister     ROS:  Unless stated in HPI, all other ROS neg.  PHYSICAL EXAM: VS:  BP 128/72   Pulse 83   Ht 5\' 5"  (1.651 m)   Wt 175 lb (79.4 kg)   SpO2 96%   BMI 29.12 kg/m  GEN: Well nourished, well developed, in no acute distress  HEENT: normal  Neck: no JVD, carotid bruits, or masses Cardiac: RRR; no murmurs, rubs, or gallops,no edema  Respiratory:  clear to  auscultation bilaterally, normal work of breathing GI: soft, nontender, nondistended, + BS MS: no deformity or atrophy  Skin: warm and dry, no rash Neuro:  Alert and Oriented x 3, Strength and sensation are intact Psych: euthymic mood, full affect  EKG:  Today 12/17/16-sinus bradycardia rate 54. Inf infarct pattern. 01/15/15-sinus rhythm, 74, lateral infarct pattern, no significant change. 01/11/14-sinus rhythm, 84 with PVC, old inferior infarct pattern.     ECHO: 08/12/15: - Hypokinesis of the basal inferior wall with overall preserved LV   systolic function; grade 1 diastolic dysfunction; trace MR and   TR. - Aortic root 32mm  NUC stress 08/12/15:  Nuclear stress EF: 51%.  There was no ST segment deviation noted during stress.  Findings consistent with prior myocardial infarction.  This is a low risk study.  The left ventricular ejection fraction is mildly decreased (45-54%).   Small inferior wall infarct from apex to base with no ischemia EF 51%      ASSESSMENT AND PLAN:  Preoperative risk assessment -Most recent stress test in 2017 showed no evidence of ischemia.  He does have an old inferior infarction which was back in 2002 in the placement of his original RCA stent.  He also had a cardiac catheterization back in 2013 which was reassuring as well.  I think that he may proceed with low to moderate cardiac risk based upon his age and prior CAD.  Please let us know if we can be of further assistance.  Dilated aortic root  - 76mm 2010. 37 mm in 2015, 11mm in 2017-stable. We will repeat echocardiogram in the next year. Overall doing well. No CP  Coronary artery disease -stable. No angina. RCA stent. Patent in 2013. NUC 2017 reassuring, small inferior infarct, no ischemia. No anginal symptoms.  Overall doing very well.  Hypertension -well-controlled. No changes made. ACE inhibitor. No beta blocker because of fatigue. He has been on Cardura for years for his hypertension. He has  also been on quinapril 20 mg tablet 1.5 tablets a day. He has had an occasional hypotensive episode in the distant past. Stable for now. No changes  Hyperlipidemia-LDL 76 atorvastatin 80. Continue. Dr. Rex Kras has been monitoring his lipids. Reviewed most recent labs on KPN  Fatigue-continue to exercise. TSH is normal.   One-year follow-up.  Signed, Candee Furbish, MD [facility]  07/27/2017 8:43 AM

## 2017-07-28 ENCOUNTER — Telehealth: Payer: Self-pay

## 2017-07-28 NOTE — Telephone Encounter (Signed)
   [facility] Pre-operative Risk Assessment    Request for surgical clearance:  1. What type of surgery is being performed? Left Knee: Left UKA   2. When is this surgery scheduled? 08/02/17   3. What type of clearance is required (medical clearance vs. Pharmacy clearance to hold med vs. Both)? Both  4. Are there any medications that need to be held prior to surgery and how long? None listed   5. Practice name and name of physician performing surgery? [facility] Alvan Dame, MD   6. What is your office phone and fax number? Phone: [phone] Fax: [phone], ATTN: Fabio Asa   7. Anesthesia type (None, local, MAC, general) ? Spinal   Jacinta Shoe 07/28/2017, 1:38 PM  _________________________________________________________________   (provider comments below)

## 2017-07-29 NOTE — Telephone Encounter (Signed)
   Primary Cardiologist: Dr. Marlou Porch  Chart reviewed as part of pre-operative protocol coverage. Given past medical history and time since last visit, based on ACC/AHA guidelines, Bruce [name] would be at acceptable risk for the planned procedure without further cardiovascular testing.   Pt was seen by Dr. Marlou Porch on 07/27/17 and cleared. Per Dr. Marlou Porch, he may proceed with low to moderate cardiac risk based upon his age and prior CAD.   I will route this recommendation to the requesting party via Epic fax function and remove from pre-op pool.  Please call with questions.  Lyda Jester, PA-C 07/29/2017, 2:15 PM

## 2017-08-01 MED ORDER — SODIUM CHLORIDE 0.9 % IV SOLN
1000.0000 mg | INTRAVENOUS | Status: AC
  Administered 2017-08-02: 1000 mg via INTRAVENOUS
  Filled 2017-08-01: qty 1100

## 2017-08-02 ENCOUNTER — Observation Stay (HOSPITAL_COMMUNITY)
Admission: RE | Admit: 2017-08-02 | Discharge: 2017-08-03 | Disposition: A | Discharge status: Home / Self Care | Payer: Medicare HMO | Source: Ambulatory Visit | Attending: Orthopedic Surgery | Admitting: Orthopedic Surgery

## 2017-08-02 ENCOUNTER — Other Ambulatory Visit: Payer: Self-pay

## 2017-08-02 ENCOUNTER — Encounter (HOSPITAL_COMMUNITY)
Admission: RE | Disposition: A | Discharge status: Home / Self Care | Payer: Self-pay | Source: Ambulatory Visit | Attending: Orthopedic Surgery

## 2017-08-02 ENCOUNTER — Ambulatory Visit (HOSPITAL_COMMUNITY): Payer: Medicare HMO | Admitting: Anesthesiology

## 2017-08-02 ENCOUNTER — Encounter (HOSPITAL_COMMUNITY): Payer: Self-pay

## 2017-08-02 DIAGNOSIS — I252 Old myocardial infarction: Secondary | ICD-10-CM | POA: Insufficient documentation

## 2017-08-02 DIAGNOSIS — Z79899 Other long term (current) drug therapy: Secondary | ICD-10-CM | POA: Insufficient documentation

## 2017-08-02 DIAGNOSIS — M1712 Unilateral primary osteoarthritis, left knee: Principal | ICD-10-CM | POA: Insufficient documentation

## 2017-08-02 DIAGNOSIS — Z87442 Personal history of urinary calculi: Secondary | ICD-10-CM | POA: Insufficient documentation

## 2017-08-02 DIAGNOSIS — G8918 Other acute postprocedural pain: Secondary | ICD-10-CM | POA: Diagnosis not present

## 2017-08-02 DIAGNOSIS — I10 Essential (primary) hypertension: Secondary | ICD-10-CM | POA: Diagnosis not present

## 2017-08-02 DIAGNOSIS — I2510 Atherosclerotic heart disease of native coronary artery without angina pectoris: Secondary | ICD-10-CM | POA: Diagnosis not present

## 2017-08-02 DIAGNOSIS — R262 Difficulty in walking, not elsewhere classified: Secondary | ICD-10-CM | POA: Diagnosis not present

## 2017-08-02 DIAGNOSIS — Z96652 Presence of left artificial knee joint: Secondary | ICD-10-CM

## 2017-08-02 HISTORY — PX: PARTIAL KNEE ARTHROPLASTY: SHX2174

## 2017-08-02 LAB — TYPE AND SCREEN
ABO/RH(D): A NEG
ANTIBODY SCREEN: NEGATIVE

## 2017-08-02 SURGERY — ARTHROPLASTY, KNEE, UNICOMPARTMENTAL
Anesthesia: Spinal | Site: Knee | Laterality: Left

## 2017-08-02 MED ORDER — DOCUSATE SODIUM 100 MG PO CAPS: 100.0000 mg | ORAL_CAPSULE | Freq: Two times a day (BID) | ORAL | 0 refills | Status: DC

## 2017-08-02 MED ORDER — FERROUS SULFATE 325 (65 FE) MG PO TABS
325.0000 mg | ORAL_TABLET | Freq: Three times a day (TID) | ORAL | Status: DC
  Administered 2017-08-02 – 2017-08-03 (×2): 325 mg via ORAL
  Filled 2017-08-02 (×2): qty 1

## 2017-08-02 MED ORDER — HYDROCODONE-ACETAMINOPHEN 7.5-325 MG PO TABS: 1.0000 | ORAL_TABLET | ORAL | 0 refills | Status: DC | PRN

## 2017-08-02 MED ORDER — DOCUSATE SODIUM 100 MG PO CAPS
100.0000 mg | ORAL_CAPSULE | Freq: Two times a day (BID) | ORAL | Status: DC
  Administered 2017-08-02 – 2017-08-03 (×2): 100 mg via ORAL
  Filled 2017-08-02 (×2): qty 1

## 2017-08-02 MED ORDER — CEFAZOLIN SODIUM-DEXTROSE 2-4 GM/100ML-% IV SOLN
2.0000 g | INTRAVENOUS | Status: AC
  Administered 2017-08-02: 2 g via INTRAVENOUS
  Filled 2017-08-02: qty 100

## 2017-08-02 MED ORDER — MENTHOL 3 MG MT LOZG: 1.0000 | LOZENGE | OROMUCOSAL | Status: DC | PRN

## 2017-08-02 MED ORDER — MORPHINE SULFATE (PF) 2 MG/ML IV SOLN: 0.5000 mg | INTRAVENOUS | Status: DC | PRN

## 2017-08-02 MED ORDER — FENTANYL CITRATE (PF) 250 MCG/5ML IJ SOLN
INTRAMUSCULAR | Status: AC
  Filled 2017-08-02: qty 5

## 2017-08-02 MED ORDER — PHENYLEPHRINE 40 MCG/ML (10ML) SYRINGE FOR IV PUSH (FOR BLOOD PRESSURE SUPPORT)
PREFILLED_SYRINGE | INTRAVENOUS | Status: DC | PRN
  Administered 2017-08-02 (×2): 80 ug via INTRAVENOUS

## 2017-08-02 MED ORDER — METOCLOPRAMIDE HCL 5 MG PO TABS: 5.0000 mg | ORAL_TABLET | Freq: Three times a day (TID) | ORAL | Status: DC | PRN

## 2017-08-02 MED ORDER — DIPHENHYDRAMINE HCL 12.5 MG/5ML PO ELIX
12.5000 mg | ORAL_SOLUTION | ORAL | Status: DC | PRN
  Administered 2017-08-02: 25 mg via ORAL
  Filled 2017-08-02: qty 10

## 2017-08-02 MED ORDER — HYDROCODONE-ACETAMINOPHEN 5-325 MG PO TABS: 1.0000 | ORAL_TABLET | ORAL | Status: DC | PRN

## 2017-08-02 MED ORDER — SODIUM CHLORIDE 0.9 % IV SOLN
INTRAVENOUS | Status: DC
  Administered 2017-08-02: 18:00:00 via INTRAVENOUS

## 2017-08-02 MED ORDER — LEVOFLOXACIN 500 MG PO TABS: 500.0000 mg | ORAL_TABLET | Freq: Every day | ORAL | Status: DC

## 2017-08-02 MED ORDER — LIDOCAINE 2% (20 MG/ML) 5 ML SYRINGE
INTRAMUSCULAR | Status: DC | PRN
Start: 2017-08-02 — End: 2017-08-02
  Administered 2017-08-02: 40 mg via INTRAVENOUS
  Administered 2017-08-02: 60 mg via INTRAVENOUS

## 2017-08-02 MED ORDER — POLYETHYLENE GLYCOL 3350 17 G PO PACK: 17.0000 g | PACK | Freq: Two times a day (BID) | ORAL | 0 refills | Status: DC

## 2017-08-02 MED ORDER — BUPIVACAINE HCL (PF) 0.25 % IJ SOLN
INTRAMUSCULAR | Status: DC | PRN
  Administered 2017-08-02: 29 mL

## 2017-08-02 MED ORDER — ONDANSETRON HCL 4 MG/2ML IJ SOLN: 4.0000 mg | Freq: Once | INTRAMUSCULAR | Status: DC | PRN

## 2017-08-02 MED ORDER — SODIUM CHLORIDE 0.9 % IR SOLN
Status: DC | PRN
  Administered 2017-08-02: 1000 mL

## 2017-08-02 MED ORDER — FENTANYL CITRATE (PF) 100 MCG/2ML IJ SOLN: 25.0000 ug | INTRAMUSCULAR | Status: DC | PRN

## 2017-08-02 MED ORDER — ALBUMIN HUMAN 5 % IV SOLN
INTRAVENOUS | Status: DC | PRN
  Administered 2017-08-02: 11:00:00 via INTRAVENOUS

## 2017-08-02 MED ORDER — KETOROLAC TROMETHAMINE 30 MG/ML IJ SOLN
INTRAMUSCULAR | Status: DC | PRN
  Administered 2017-08-02: 30 mg

## 2017-08-02 MED ORDER — DEXAMETHASONE SODIUM PHOSPHATE 10 MG/ML IJ SOLN
10.0000 mg | Freq: Once | INTRAMUSCULAR | Status: AC
  Administered 2017-08-03: 10 mg via INTRAVENOUS
  Filled 2017-08-02: qty 1

## 2017-08-02 MED ORDER — ACETAMINOPHEN 325 MG PO TABS: 325.0000 mg | ORAL_TABLET | Freq: Four times a day (QID) | ORAL | Status: DC | PRN

## 2017-08-02 MED ORDER — HYDROCODONE-ACETAMINOPHEN 7.5-325 MG PO TABS: 1.0000 | ORAL_TABLET | ORAL | Status: DC | PRN

## 2017-08-02 MED ORDER — OXYCODONE HCL 5 MG/5ML PO SOLN
5.0000 mg | Freq: Once | ORAL | Status: DC | PRN
  Filled 2017-08-02: qty 5

## 2017-08-02 MED ORDER — METHOCARBAMOL 500 MG PO TABS
500.0000 mg | ORAL_TABLET | Freq: Four times a day (QID) | ORAL | 0 refills | Status: DC | PRN
Start: 2017-08-02 — End: 2018-02-21

## 2017-08-02 MED ORDER — ONDANSETRON HCL 4 MG/2ML IJ SOLN
INTRAMUSCULAR | Status: DC | PRN
Start: 2017-08-02 — End: 2017-08-02
  Administered 2017-08-02: 4 mg via INTRAVENOUS

## 2017-08-02 MED ORDER — PROPOFOL 10 MG/ML IV BOLUS
INTRAVENOUS | Status: DC | PRN
  Administered 2017-08-02 (×2): 20 mg via INTRAVENOUS

## 2017-08-02 MED ORDER — FENTANYL CITRATE (PF) 100 MCG/2ML IJ SOLN
50.0000 ug | INTRAMUSCULAR | Status: DC
  Administered 2017-08-02 (×2): 50 ug via INTRAVENOUS
  Filled 2017-08-02: qty 2

## 2017-08-02 MED ORDER — METHOCARBAMOL 500 MG PO TABS
500.0000 mg | ORAL_TABLET | Freq: Four times a day (QID) | ORAL | Status: DC | PRN
  Administered 2017-08-02: 500 mg via ORAL
  Filled 2017-08-02: qty 1

## 2017-08-02 MED ORDER — CEFAZOLIN SODIUM-DEXTROSE 2-4 GM/100ML-% IV SOLN
2.0000 g | Freq: Four times a day (QID) | INTRAVENOUS | Status: AC
  Administered 2017-08-02 (×2): 2 g via INTRAVENOUS
  Filled 2017-08-02 (×2): qty 100

## 2017-08-02 MED ORDER — SODIUM CHLORIDE 0.9 % IJ SOLN
INTRAMUSCULAR | Status: AC
  Filled 2017-08-02: qty 50

## 2017-08-02 MED ORDER — PROPOFOL 10 MG/ML IV BOLUS
INTRAVENOUS | Status: AC
  Filled 2017-08-02: qty 20

## 2017-08-02 MED ORDER — TRANEXAMIC ACID 1000 MG/10ML IV SOLN
1000.0000 mg | Freq: Once | INTRAVENOUS | Status: AC
  Administered 2017-08-02: 1000 mg via INTRAVENOUS
  Filled 2017-08-02: qty 1100

## 2017-08-02 MED ORDER — OMEPRAZOLE 20 MG PO CPDR: 40.0000 mg | DELAYED_RELEASE_CAPSULE | Freq: Every day | ORAL | Status: DC

## 2017-08-02 MED ORDER — 0.9 % SODIUM CHLORIDE (POUR BTL) OPTIME
TOPICAL | Status: DC | PRN
  Administered 2017-08-02: 1000 mL

## 2017-08-02 MED ORDER — PROPOFOL 10 MG/ML IV BOLUS
INTRAVENOUS | Status: AC
  Filled 2017-08-02: qty 40

## 2017-08-02 MED ORDER — MAGNESIUM CITRATE PO SOLN
1.0000 | Freq: Once | ORAL | Status: DC | PRN
Start: 2017-08-02 — End: 2017-08-03

## 2017-08-02 MED ORDER — METOCLOPRAMIDE HCL 5 MG/ML IJ SOLN: 5.0000 mg | Freq: Three times a day (TID) | INTRAMUSCULAR | Status: DC | PRN

## 2017-08-02 MED ORDER — ONDANSETRON HCL 4 MG/2ML IJ SOLN: 4.0000 mg | Freq: Four times a day (QID) | INTRAMUSCULAR | Status: DC | PRN

## 2017-08-02 MED ORDER — PROSIGHT PO TABS
1.0000 | ORAL_TABLET | Freq: Every day | ORAL | Status: DC
  Administered 2017-08-02 – 2017-08-03 (×2): 1 via ORAL
  Filled 2017-08-02 (×2): qty 1

## 2017-08-02 MED ORDER — STERILE WATER FOR IRRIGATION IR SOLN
Status: DC | PRN
  Administered 2017-08-02: 2000 mL

## 2017-08-02 MED ORDER — DEXAMETHASONE SODIUM PHOSPHATE 10 MG/ML IJ SOLN
10.0000 mg | Freq: Once | INTRAMUSCULAR | Status: AC
  Administered 2017-08-02: 10 mg via INTRAVENOUS

## 2017-08-02 MED ORDER — LACTATED RINGERS IV SOLN
INTRAVENOUS | Status: DC
  Administered 2017-08-02 (×3): via INTRAVENOUS

## 2017-08-02 MED ORDER — CHLORHEXIDINE GLUCONATE 4 % EX LIQD: 60.0000 mL | Freq: Once | CUTANEOUS | Status: DC

## 2017-08-02 MED ORDER — PHENOL 1.4 % MT LIQD: 1.0000 | OROMUCOSAL | Status: DC | PRN

## 2017-08-02 MED ORDER — DOXAZOSIN MESYLATE 8 MG PO TABS: 8.0000 mg | ORAL_TABLET | Freq: Every day | ORAL | Status: DC

## 2017-08-02 MED ORDER — ATORVASTATIN CALCIUM 40 MG PO TABS
80.0000 mg | ORAL_TABLET | Freq: Every day | ORAL | Status: DC
  Administered 2017-08-03: 80 mg via ORAL
  Filled 2017-08-02: qty 2

## 2017-08-02 MED ORDER — MIDAZOLAM HCL 2 MG/2ML IJ SOLN
1.0000 mg | INTRAMUSCULAR | Status: DC
  Filled 2017-08-02: qty 2

## 2017-08-02 MED ORDER — MIDAZOLAM HCL 2 MG/2ML IJ SOLN
INTRAMUSCULAR | Status: AC
  Filled 2017-08-02: qty 2

## 2017-08-02 MED ORDER — FERROUS SULFATE 325 (65 FE) MG PO TABS: 325.0000 mg | ORAL_TABLET | Freq: Three times a day (TID) | ORAL | 3 refills | Status: DC

## 2017-08-02 MED ORDER — ASPIRIN 81 MG PO CHEW
81.0000 mg | CHEWABLE_TABLET | Freq: Two times a day (BID) | ORAL | Status: DC
  Administered 2017-08-02 – 2017-08-03 (×2): 81 mg via ORAL
  Filled 2017-08-02 (×2): qty 1

## 2017-08-02 MED ORDER — OXYCODONE HCL 5 MG PO TABS: 5.0000 mg | ORAL_TABLET | Freq: Once | ORAL | Status: DC | PRN

## 2017-08-02 MED ORDER — TRAMADOL HCL 50 MG PO TABS
50.0000 mg | ORAL_TABLET | Freq: Four times a day (QID) | ORAL | Status: DC
  Administered 2017-08-02 – 2017-08-03 (×3): 50 mg via ORAL
  Filled 2017-08-02 (×4): qty 1

## 2017-08-02 MED ORDER — ONDANSETRON HCL 4 MG PO TABS: 4.0000 mg | ORAL_TABLET | Freq: Four times a day (QID) | ORAL | Status: DC | PRN

## 2017-08-02 MED ORDER — SODIUM CHLORIDE 0.9 % IJ SOLN
INTRAMUSCULAR | Status: DC | PRN
  Administered 2017-08-02: 30 mL

## 2017-08-02 MED ORDER — BUPIVACAINE HCL (PF) 0.25 % IJ SOLN
INTRAMUSCULAR | Status: AC
  Filled 2017-08-02: qty 30

## 2017-08-02 MED ORDER — PROPOFOL 500 MG/50ML IV EMUL
INTRAVENOUS | Status: DC | PRN
  Administered 2017-08-02: 50 ug/kg/min via INTRAVENOUS

## 2017-08-02 MED ORDER — POLYETHYLENE GLYCOL 3350 17 G PO PACK
17.0000 g | PACK | Freq: Two times a day (BID) | ORAL | Status: DC
  Administered 2017-08-03: 17 g via ORAL
  Filled 2017-08-02: qty 1

## 2017-08-02 MED ORDER — BUPIVACAINE IN DEXTROSE 0.75-8.25 % IT SOLN
INTRATHECAL | Status: DC | PRN
  Administered 2017-08-02: 1.6 mL via INTRATHECAL

## 2017-08-02 MED ORDER — BUPIVACAINE-EPINEPHRINE (PF) 0.5% -1:200000 IJ SOLN
INTRAMUSCULAR | Status: DC | PRN
  Administered 2017-08-02: 30 mL via PERINEURAL

## 2017-08-02 MED ORDER — KETOROLAC TROMETHAMINE 30 MG/ML IJ SOLN
INTRAMUSCULAR | Status: AC
  Filled 2017-08-02: qty 1

## 2017-08-02 MED ORDER — METHOCARBAMOL 1000 MG/10ML IJ SOLN
500.0000 mg | Freq: Four times a day (QID) | INTRAVENOUS | Status: DC | PRN
  Administered 2017-08-02: 500 mg via INTRAVENOUS
  Filled 2017-08-02: qty 5

## 2017-08-02 MED ORDER — BISACODYL 10 MG RE SUPP: 10.0000 mg | Freq: Every day | RECTAL | Status: DC | PRN

## 2017-08-02 MED ORDER — PROPOFOL 10 MG/ML IV BOLUS
INTRAVENOUS | Status: AC
Start: 2017-08-02 — End: 2017-08-02
  Filled 2017-08-02: qty 20

## 2017-08-02 MED ORDER — ALUM & MAG HYDROXIDE-SIMETH 200-200-20 MG/5ML PO SUSP: 15.0000 mL | ORAL | Status: DC | PRN

## 2017-08-02 MED ORDER — PHENYLEPHRINE HCL 10 MG/ML IJ SOLN
INTRAVENOUS | Status: DC | PRN
  Administered 2017-08-02: 25 ug/min via INTRAVENOUS

## 2017-08-02 MED ORDER — ASPIRIN 81 MG PO CHEW: 81.0000 mg | CHEWABLE_TABLET | Freq: Two times a day (BID) | ORAL | 0 refills | Status: AC

## 2017-08-02 SURGICAL SUPPLY — 47 items
ADH SKN CLS APL DERMABOND .7 (GAUZE/BANDAGES/DRESSINGS) ×1
BAG DECANTER FOR FLEXI CONT (MISCELLANEOUS) IMPLANT
BAG SPEC THK2 15X12 ZIP CLS (MISCELLANEOUS) ×1
BAG ZIPLOCK 12X15 (MISCELLANEOUS) ×1 IMPLANT
BANDAGE ACE 6X5 VEL STRL LF (GAUZE/BANDAGES/DRESSINGS) ×2 IMPLANT
BEARING TIBIAL SZ 3 MED 3 (Knees) ×1 IMPLANT
BLADE SAW RECIPROCATING 77.5 (BLADE) ×2 IMPLANT
BLADE SAW SGTL 13.0X1.19X90.0M (BLADE) ×2 IMPLANT
BOWL SMART MIX CTS (DISPOSABLE) ×2 IMPLANT
BRNG TIB MED 3 PHS 3 LT MEN (Knees) ×1 IMPLANT
CEMENT HV SMART SET (Cement) ×1 IMPLANT
CLOTH BEACON ORANGE TIMEOUT ST (SAFETY) ×2 IMPLANT
COMPONENT TIBIA MEDL OXFRD LFT (Joint) IMPLANT
COVER SURGICAL LIGHT HANDLE (MISCELLANEOUS) ×2 IMPLANT
CUFF TOURN SGL QUICK 34 (TOURNIQUET CUFF) ×2
CUFF TRNQT CYL 34X4X40X1 (TOURNIQUET CUFF) ×1 IMPLANT
DERMABOND ADVANCED (GAUZE/BANDAGES/DRESSINGS) ×1
DERMABOND ADVANCED .7 DNX12 (GAUZE/BANDAGES/DRESSINGS) ×1 IMPLANT
DRAPE TOP 10253 STERILE (DRAPES) IMPLANT
DRAPE U-SHAPE 47X51 STRL (DRAPES) ×2 IMPLANT
DRESSING AQUACEL AG SP 3.5X10 (GAUZE/BANDAGES/DRESSINGS) ×1 IMPLANT
DRSG AQUACEL AG SP 3.5X10 (GAUZE/BANDAGES/DRESSINGS) ×2
DURAPREP 26ML APPLICATOR (WOUND CARE) ×4 IMPLANT
ELECT REM PT RETURN 15FT ADLT (MISCELLANEOUS) ×2 IMPLANT
GLOVE BIOGEL M 7.0 STRL (GLOVE) IMPLANT
GLOVE BIOGEL PI IND STRL 7.5 (GLOVE) ×1 IMPLANT
GLOVE BIOGEL PI IND STRL 8.5 (GLOVE) ×1 IMPLANT
GLOVE BIOGEL PI INDICATOR 7.5 (GLOVE) ×1
GLOVE BIOGEL PI INDICATOR 8.5 (GLOVE) ×1
GLOVE ECLIPSE 8.0 STRL XLNG CF (GLOVE) ×2 IMPLANT
GLOVE ORTHO TXT STRL SZ7.5 (GLOVE) ×4 IMPLANT
GOWN STRL REUS W/TWL LRG LVL3 (GOWN DISPOSABLE) ×2 IMPLANT
GOWN STRL REUS W/TWL XL LVL3 (GOWN DISPOSABLE) ×2 IMPLANT
LEGGING LITHOTOMY PAIR STRL (DRAPES) ×2 IMPLANT
MANIFOLD NEPTUNE II (INSTRUMENTS) ×2 IMPLANT
PACK TOTAL KNEE CUSTOM (KITS) ×2 IMPLANT
PEG TWIN FEM CEMENTED MED (Knees) ×1 IMPLANT
SUT MNCRL AB 4-0 PS2 18 (SUTURE) ×2 IMPLANT
SUT STRATAFIX 0 PDS 27 VIOLET (SUTURE)
SUT VIC AB 1 CT1 36 (SUTURE) ×2 IMPLANT
SUT VIC AB 2-0 CT1 27 (SUTURE) ×4
SUT VIC AB 2-0 CT1 TAPERPNT 27 (SUTURE) ×2 IMPLANT
SUTURE STRATFX 0 PDS 27 VIOLET (SUTURE) ×1 IMPLANT
SYR 50ML LL SCALE MARK (SYRINGE) ×1 IMPLANT
TIBIA MEDIAL OXFORD LEFT (Joint) ×2 IMPLANT
TRAY FOLEY W/METER SILVER 16FR (SET/KITS/TRAYS/PACK) ×1 IMPLANT
WRAP KNEE MAXI GEL POST OP (GAUZE/BANDAGES/DRESSINGS) ×1 IMPLANT

## 2017-08-02 NOTE — Anesthesia Procedure Notes (Signed)
Spinal  Patient location during procedure: OR End time: 08/02/2017 10:08 AM Staffing Resident/CRNA: Cynda Familia, CRNA Performed: resident/CRNA  Preanesthetic Checklist Completed: patient identified, site marked, surgical consent, pre-op evaluation, timeout performed, IV checked, risks and benefits discussed and monitors and equipment checked Spinal Block Patient position: sitting Prep: ChloraPrep and site prepped and draped Patient monitoring: heart rate, continuous pulse ox, blood pressure and cardiac monitor Approach: midline Location: L3-4 Injection technique: single-shot Needle Needle type: Sprotte  Needle gauge: 24 G Assessment Sensory level: T4 Additional Notes Expiration date of tray noted and within date.   Patient tolerated procedure well. Fransisco Beau present assisted and supervised spinal placement--- dosed at 1.6cc as per Fransisco Beau--- prep dry at time of insertion.

## 2017-08-02 NOTE — Anesthesia Postprocedure Evaluation (Signed)
Anesthesia Post Note  Patient: Lenon R Goodin  Procedure(s) Performed: LEFT UNICOMPARTMENTAL KNEE (Left Knee)     Patient location during evaluation: PACU Anesthesia Type: Spinal Level of consciousness: awake and alert Pain management: pain level controlled Vital Signs Assessment: post-procedure vital signs reviewed and stable Respiratory status: spontaneous breathing, respiratory function stable and patient connected to nasal cannula oxygen Cardiovascular status: blood pressure returned to baseline and stable Postop Assessment: spinal receding and no apparent nausea or vomiting Anesthetic complications: no    Last Vitals:  Vitals:   08/02/17 1215 08/02/17 1230  BP: 126/82 135/88  Pulse: (!) 54 (!) 57  Resp: 16 16  Temp:    SpO2: 94% 95%    Last Pain:  Vitals:   08/02/17 1230  TempSrc:   PainSc: 0-No pain    LLE Motor Response: Purposeful movement (08/02/17 1230) LLE Sensation: Decreased;Numbness (08/02/17 1230) RLE Motor Response: Purposeful movement (08/02/17 1230) RLE Sensation: Decreased;Numbness (08/02/17 1230) L Sensory Level: L5-Outer lower leg, top of foot, great toe (08/02/17 1230) R Sensory Level: S1-Sole of foot, small toes (08/02/17 1230)  Audry Pili

## 2017-08-02 NOTE — Progress Notes (Signed)
AssistedDr. Brock with left, ultrasound guided, adductor canal block. Side rails up, monitors on throughout procedure. See vital signs in flow sheet. Tolerated Procedure well.  

## 2017-08-02 NOTE — Anesthesia Preprocedure Evaluation (Signed)
Anesthesia Evaluation  Patient identified by MRN, date of birth, ID band Patient awake    Reviewed: Allergy & Precautions, NPO status , Patient's Chart, lab work & pertinent test results  Airway Mallampati: II  TM Distance: >3 FB Neck ROM: Full    Dental  (+) Dental Advisory Given   Pulmonary neg pulmonary ROS,    Pulmonary exam normal breath sounds clear to auscultation       Cardiovascular hypertension, + CAD, + Past MI, + Cardiac Stents and + Peripheral Vascular Disease  Normal cardiovascular exam Rhythm:Regular Rate:Normal  '17 Stress Nuc - Nuclear stress EF: 51%. There was no ST segment deviation noted during stress. Findings consistent with prior myocardial infarction. This is a low risk study. The left ventricular ejection fraction is mildly decreased (45-54%).  Small inferior wall infarct from apex to base with no ischemia EF 51%  '18 EKG - SB with inferior Q waves and TWI   Neuro/Psych negative neurological ROS  negative psych ROS   GI/Hepatic Neg liver ROS, PUD, GERD  Controlled and Medicated,  Endo/Other  negative endocrine ROS  Renal/GU Nephrolithiasis  negative genitourinary   Musculoskeletal  (+) Arthritis ,   Abdominal   Peds  Hematology negative hematology ROS (+)   Anesthesia Other Findings   Reproductive/Obstetrics                             Anesthesia Physical Anesthesia Plan  ASA: III  Anesthesia Plan: Spinal   Post-op Pain Management:  Regional for Post-op pain   Induction:   PONV Risk Score and Plan: Treatment may vary due to age or medical condition and Propofol infusion  Airway Management Planned: Natural Airway and Simple Face Mask  Additional Equipment: None  Intra-op Plan:   Post-operative Plan:   Informed Consent: I have reviewed the patients History and Physical, chart, labs and discussed the procedure including the risks, benefits and  alternatives for the proposed anesthesia with the patient or authorized representative who has indicated his/her understanding and acceptance.     Plan Discussed with: CRNA and Anesthesiologist  Anesthesia Plan Comments:         Anesthesia Quick Evaluation

## 2017-08-02 NOTE — Evaluation (Signed)
Physical Therapy Evaluation Patient Details Name: Bruce [name] MRN: [id number] DOB: 1935-05-23 Today's Date: 08/02/2017   History of Present Illness  82 yo male s/p L uni knee arthroplasty 08/02/17.   Clinical Impression  On eval POD 0, pt was Min guard assist for mobility. He walked ~65 feet with a RW. Pain rated 5/10 with activity. Will follow and progress activity as tolerated. Per chart, plan is for OP PT.     Follow Up Recommendations Follow surgeon's recommendation for DC plan and follow-up therapies    Equipment Recommendations  None recommended by PT    Recommendations for Other Services       Precautions / Restrictions Precautions Precautions: Fall;Knee Restrictions Weight Bearing Restrictions: No LLE Weight Bearing: Weight bearing as tolerated      Mobility  Bed Mobility Overal bed mobility: Needs Assistance Bed Mobility: Supine to Sit     Supine to sit: Min guard     General bed mobility comments: close guard for safety.  Transfers Overall transfer level: Needs assistance Equipment used: Rolling walker (2 wheeled) Transfers: Sit to/from Stand Sit to Stand: Min guard         General transfer comment: close guard for safety. VCS safety, hand/LE placement  Ambulation/Gait Ambulation/Gait assistance: Min guard Ambulation Distance (Feet): 65 Feet Assistive device: Rolling walker (2 wheeled) Gait Pattern/deviations: Step-to pattern;Step-through pattern;Decreased stride length     General Gait Details: close guard for safety. VCs safety, sequence.   Stairs            Wheelchair Mobility    Modified Rankin (Stroke Patients Only)       Balance Overall balance assessment: Mild deficits observed, not formally tested                                           Pertinent Vitals/Pain Pain Assessment: 0-10 Pain Score: 5  Pain Location: L knee Pain Descriptors / Indicators: Sore;Aching Pain Intervention(s): Monitored during  session;Repositioned;Ice applied    Home Living Family/patient expects to be discharged to:: Private residence Living Arrangements: Spouse/significant other Available Help at Discharge: Family Type of Home: House Home Access: Stairs to enter     Home Layout: One level Home Equipment: Environmental consultant - 2 wheels;Bedside commode      Prior Function Level of Independence: Independent               Hand Dominance        Extremity/Trunk Assessment   Upper Extremity Assessment Upper Extremity Assessment: Overall WFL for tasks assessed    Lower Extremity Assessment Lower Extremity Assessment: Generalized weakness(s/p L knee surgery)    Cervical / Trunk Assessment Cervical / Trunk Assessment: Normal  Communication   Communication: No difficulties  Cognition Arousal/Alertness: Awake/alert Behavior During Therapy: WFL for tasks assessed/performed Overall Cognitive Status: Within Functional Limits for tasks assessed                                        General Comments      Exercises     Assessment/Plan    PT Assessment Patient needs continued PT services  PT Problem List Decreased strength;Decreased range of motion;Decreased mobility;Decreased balance;Pain;Decreased knowledge of use of DME;Decreased activity tolerance       PT Treatment Interventions DME instruction;Gait training;Functional mobility training;Balance training;Therapeutic  activities;Stair training;Therapeutic exercise;Patient/family education    PT Goals (Current goals can be found in the Care Plan section)  Acute Rehab PT Goals Patient Stated Goal: regain independence PT Goal Formulation: With patient/family Time For Goal Achievement: 08/16/17 Potential to Achieve Goals: Good    Frequency 7X/week   Barriers to discharge        Co-evaluation               AM-PAC PT "6 Clicks" Daily Activity  Outcome Measure Difficulty turning over in bed (including adjusting bedclothes,  sheets and blankets)?: A Little Difficulty moving from lying on back to sitting on the side of the bed? : A Little Difficulty sitting down on and standing up from a chair with arms (e.g., wheelchair, bedside commode, etc,.)?: A Little Help needed moving to and from a bed to chair (including a wheelchair)?: A Little Help needed walking in hospital room?: A Little Help needed climbing 3-5 steps with a railing? : A Little 6 Click Score: 18    End of Session Equipment Utilized During Treatment: Gait belt Activity Tolerance: Patient tolerated treatment well Patient left: in chair;with call bell/phone within reach;with family/visitor present   PT Visit Diagnosis: Difficulty in walking, not elsewhere classified (R26.2);Pain Pain - Right/Left: Left Pain - part of body: Knee    Time: 0301-3143 PT Time Calculation (min) (ACUTE ONLY): 8 min   Charges:   PT Evaluation $PT Eval Low Complexity: 1 Low     PT G Codes:         Weston Anna, MPT Pager: [phone]

## 2017-08-02 NOTE — Discharge Instructions (Signed)

## 2017-08-02 NOTE — Anesthesia Procedure Notes (Signed)
Date/Time: 08/02/2017 9:57 AM Performed by: Cynda Familia, CRNA Pre-anesthesia Checklist: Patient identified, Suction available, Emergency Drugs available, Patient being monitored and Timeout performed Patient Re-evaluated:Patient Re-evaluated prior to induction Oxygen Delivery Method: Simple face mask Placement Confirmation: breath sounds checked- equal and bilateral and positive ETCO2 Dental Injury: Teeth and Oropharynx as per pre-operative assessment

## 2017-08-02 NOTE — Transfer of Care (Signed)
Immediate Anesthesia Transfer of Care Note  Patient: Tyce R Skibinski  Procedure(s) Performed: LEFT UNICOMPARTMENTAL KNEE (Left Knee)  Patient Location: PACU  Anesthesia Type:Spinal  Level of Consciousness: awake and alert   Airway & Oxygen Therapy: Patient Spontanous Breathing and Patient connected to face mask oxygen  Post-op Assessment: Report given to RN and Post -op Vital signs reviewed and stable  Post vital signs: Reviewed and stable  Last Vitals:  Vitals:   08/02/17 0925 08/02/17 0935  BP: 125/74 127/73  Pulse: 60 61  Resp: 16 16  Temp:    SpO2: 93% 95%    Last Pain:  Vitals:   08/02/17 0744  TempSrc: Oral         Complications: No apparent anesthesia complications

## 2017-08-02 NOTE — Op Note (Signed)
NAME: Bruce [name]    MEDICAL RECORD NO.: [id number]   FACILITY: [facility] BIRTH: Aug 10, 1935  PHYSICIAN: Pietro Cassis. Alvan Dame, M.D.    DATE OF PROCEDURE: 08/02/2017    OPERATIVE REPORT   PREOPERATIVE DIAGNOSIS: Left knee medial compartment osteoarthritis.   POSTOPERATIVE DIAGNOSIS: Left knee medial compartment osteoarthritis.  PROCEDURE: Left partial knee replacement utilizing Biomet Oxford knee  component, size medium femur, a left medial size C tibial tray with a size 3 mm insert.   SURGEON: Pietro Cassis. Alvan Dame, M.D.   ASSISTANT: Danae Orleans, PAC.  Please note that [name] was present for the entirety of the case,  utilized for preoperative positioning, perioperative retractor  management, general facilitation of the case and primary wound closure.   ANESTHESIA: regional and spinal.   SPECIMENS: None.   COMPLICATIONS: None.  DRAINS: None   TOURNIQUET TIME: 27 minutes at 250 mmHg.   INDICATIONS FOR PROCEDURE: The patient is a 82 y.o. patient of mine who presented for evaluation of left knee pain.  They presented with primary complaints of pain on the medial side of their knee. Radiographs revealed advanced medial compartment arthritis with specifically an antero-medial wear pattern.  There was bone on bone changes noted with subchondral sclerosis and osteophytes present. The patient has had progressive problems failing to respond to conservative measures of medications, injections and activity modification. Risks of infection, DVT, component failure, need for future revision surgery were all discussed and reviewed.  Consent was obtained for benefit of pain relief.   PROCEDURE IN DETAIL: The patient was brought to the operative theater.  Once adequate anesthesia, preoperative antibiotics, 2 gm of Ancef, 1 gm of Tranexamic Acid, and 10 mg of Decadron administered, the patient was positioned in supine position with a left thigh tourniquet  placed. The left lower extremity  was prepped and draped in sterile  fashion with the leg on the Oxford leg holder.  The leg was allowed to flex to 120 degrees. A time-out  was performed identifying the patient, planned procedure, and extremity.  The leg was exsanguinated, tourniquet elevated to 250 mmHg. A midline  incision was made from the proximal pole of the patella to the tibial tubercle. A  soft tissue plane was created and partial median arthrotomy was then  made to allow for subluxation of the patella. Following initial synovectomy and  debridement, the osteophytes were removed off the medial aspect of the  knee.   Using the sizing spoons for the femur I determined that the medium femur would fit best.  With the spoon in place the tibial  extramedullary guide was positioned over the anterior crest of the tibia  and pinned into position using the size 4 G-clamp. Approximately a 4 mm resection was made off the proximal tibia. First  the reciprocating saw along the medial aspect of the tibial spines, then the oscillating saw.    At this point, I sized this cut surface seem to be best fit for a size c tibial tray.  With the retractors out of the wound and the knee held at 90 degrees the 3 feeler gauge had appropriate tension on the medial ligament.   At this point, the femoral canal was opened with a drill and the  intramedullary rod passed. Then using the posterior cutting block guide for a medium resection off  the posterior aspect of the femur was positioned over the mid portion of the medial femoral condyle.  The orientation was set using the  angled guide connecting  the femoral guide to the intramedullary rod.  The 2 drill holes were made into the distal femur.  The posterior guide was then impacted into place and the posterior  femoral cut made.  At this point, I milled the distal femur with a size 3 spigot in place. At this point, we did a trial reduction of the medium femur, size C tibial tray and performed trial  reduction assessing tension at 90 degrees of flexion with the 3 feeler gauge and at 20 degrees of flexion.  At 20 degrees the 2 feeler gauge had similar tension.  Given the difference in the tension between the knee in 90 degrees versus that in 20 degrees I had to place the 4 spigot into the femur and re-mill the distal femur.  Remaining bone was removed and debrided.  I repeated the trial reduction and found that now at both 90 degrees and 20 degrees the knee ligament were tension symmetrically with the 3 feeler gauge.  Given these findings, the trial femoral component was removed. Final preparation of tibia was carried out by pinning it in position. Then  using a reciprocating saw I removed bone for the keel. Further bone was  removed with an osteotome.  Trial reduction was now carried out with the medium femur, the left medial keeled tibia, and a size 3 lollipop insert. The balance of the  ligaments appeared to be symmetric at 20 degrees and 90 degrees. Given  all these findings, the trial components were removed.   Cement was mixed. The final components were opened. The knee was irrigated with  normal saline solution. Then final debridements of the   soft tissue was carried out, I also drilled the sclerotic bone with a drill.  The final components were cemented with a single batch of cement in a  two-stage technique with the tibial component cemented first. The knee  was then brought  to 45 degrees of flexion with a 4 feeler gauge, held with pressure for a minute and half.  After this the femoral component was cemented in place.  The knee was again held at 45 degrees of flexion while the cement fully cured.  Excess cement was removed throughout the knee. Tourniquet was let down  after 27 minutes. After the cement had fully cured and excessive cement  was removed throughout the knee there was no visualized cement present.   The final left medial size 3 mm insert to match the medium femur was  chosen and snapped into position. We re-irrigated  the knee. The extensor mechanism  was then reapproximated using a #1 Vicryl and #1 Stratafix sutures with the knee in flexion. The  remaining wound was closed with 2-0 Vicryl and a running 4-0 Monocryl.  The knee was cleaned, dried, and dressed sterilely using Dermabond and  Aquacel dressing. The patient  was brought to the recovery room, Ace wrap in place, tolerating the  procedure well. He will be in the hospital for overnight observation.  We will initiate physical therapy and progress to ambulate.     Pietro Cassis Alvan Dame, M.D.

## 2017-08-02 NOTE — Interval H&P Note (Signed)
History and Physical Interval Note:  08/02/2017 8:59 AM  Bruce [name]  has presented today for surgery, with the diagnosis of Left knee osteoarthritis  The various methods of treatment have been discussed with the patient and family. After consideration of risks, benefits and other options for treatment, the patient has consented to  Procedure(s): LEFT UNICOMPARTMENTAL KNEE (Left) as a surgical intervention .  The patient's history has been reviewed, patient examined, no change in status, stable for surgery.  I have reviewed the patient's chart and labs.  Questions were answered to the patient's satisfaction.     [name]

## 2017-08-02 NOTE — Anesthesia Procedure Notes (Signed)
Anesthesia Regional Block: Adductor canal block   Pre-Anesthetic Checklist: ,, timeout performed, Correct Patient, Correct Site, Correct Laterality, Correct Procedure, Correct Position, site marked, Risks and benefits discussed,  Surgical consent,  Pre-op evaluation,  At surgeon's request and post-op pain management  Laterality: Left  Prep: chloraprep       Needles:  Injection technique: Single-shot  Needle Type: Echogenic Needle     Needle Length: 10cm  Needle Gauge: 21     Additional Needles:   Narrative:  Start time: 08/02/2017 9:08 AM End time: 08/02/2017 9:11 AM Injection made incrementally with aspirations every 5 mL.  Performed by: Personally  Anesthesiologist: Audry Pili, MD  Additional Notes: No pain on injection. No increased resistance to injection. Injection made in 5cc increments. Good needle visualization. Patient tolerated the procedure well.

## 2017-08-03 DIAGNOSIS — Z79899 Other long term (current) drug therapy: Secondary | ICD-10-CM | POA: Diagnosis not present

## 2017-08-03 DIAGNOSIS — R262 Difficulty in walking, not elsewhere classified: Secondary | ICD-10-CM | POA: Diagnosis not present

## 2017-08-03 DIAGNOSIS — I10 Essential (primary) hypertension: Secondary | ICD-10-CM | POA: Diagnosis not present

## 2017-08-03 DIAGNOSIS — I2510 Atherosclerotic heart disease of native coronary artery without angina pectoris: Secondary | ICD-10-CM | POA: Diagnosis not present

## 2017-08-03 DIAGNOSIS — Z87442 Personal history of urinary calculi: Secondary | ICD-10-CM | POA: Diagnosis not present

## 2017-08-03 DIAGNOSIS — I252 Old myocardial infarction: Secondary | ICD-10-CM | POA: Diagnosis not present

## 2017-08-03 DIAGNOSIS — M1712 Unilateral primary osteoarthritis, left knee: Secondary | ICD-10-CM | POA: Diagnosis not present

## 2017-08-03 LAB — CBC
HCT: 36.5 % — ABNORMAL LOW (ref 39.0–52.0)
HEMOGLOBIN: 11.7 g/dL — AB (ref 13.0–17.0)
MCH: 31.4 pg (ref 26.0–34.0)
MCHC: 32.1 g/dL (ref 30.0–36.0)
MCV: 97.9 fL (ref 78.0–100.0)
Platelets: 142 10*3/uL — ABNORMAL LOW (ref 150–400)
RBC: 3.73 MIL/uL — ABNORMAL LOW (ref 4.22–5.81)
RDW: 14 % (ref 11.5–15.5)
WBC: 10.6 10*3/uL — ABNORMAL HIGH (ref 4.0–10.5)

## 2017-08-03 LAB — BASIC METABOLIC PANEL
Anion gap: 8 (ref 5–15)
BUN: 20 mg/dL (ref 6–20)
CALCIUM: 8.2 mg/dL — AB (ref 8.9–10.3)
CHLORIDE: 108 mmol/L (ref 101–111)
CO2: 23 mmol/L (ref 22–32)
Creatinine, Ser: 1.35 mg/dL — ABNORMAL HIGH (ref 0.61–1.24)
GFR calc non Af Amer: 48 mL/min — ABNORMAL LOW (ref >60)
GFR, EST AFRICAN AMERICAN: 56 mL/min — AB (ref >60)
Glucose, Bld: 108 mg/dL — ABNORMAL HIGH (ref 65–99)
Potassium: 4.3 mmol/L (ref 3.5–5.1)
SODIUM: 139 mmol/L (ref 135–145)

## 2017-08-03 NOTE — Discharge Summary (Signed)
Physician Discharge Summary  Patient ID: EDVARDO HONSE MRN: [id number] DOB/AGE: [birth year] 82 y.o.  Admit date: 08/02/2017 Discharge date: 08/03/2017   Procedures:  Procedure(s) (LRB): LEFT UNICOMPARTMENTAL KNEE (Left)  Attending Physician:  Dr. Paralee Cancel   Admission Diagnoses:   Left knee medial compartmental primary OA /pain  Discharge Diagnoses:  Active Problems:   S/P left unicompartmental knee replacement  Past Medical History:  Diagnosis Date  . Arthritis   . Coronary artery disease    ETT-Myoview EF 51%, inf infarct, no ischemia, low risk  . History of echocardiogram    Echo 3/17: EF 55-60%, inferior HK, grade 1 diastolic dysfunctione  . History of kidney stones   . Hypertension   . Myocardial infarction [facility]) 2000   with stent    HPI:    Bruce [name], 82 y.o. male , has a history of pain and functional disability in the left and has failed non-surgical conservative treatments for greater than 12 weeks to include NSAID's and/or analgesics, corticosteriod injections and activity modification.  Onset of symptoms was gradual, starting ~1 years ago with gradually worsening course since that time. The patient noted prior procedures on the knee to include  arthroscopy on the left knee(s).  Patient currently rates pain in the left knee(s) at 9 out of 10 with activity. Patient has night pain, worsening of pain with activity and weight bearing, pain that interferes with activities of daily living, pain with passive range of motion, crepitus and joint swelling.  Patient has evidence of periarticular osteophytes and joint space narrowing of the medial compartment by imaging studies.  There is no active infection.  Risks, benefits and expectations were discussed with the patient.  Risks including but not limited to the risk of anesthesia, blood clots, nerve damage, blood vessel damage, failure of the prosthesis, infection and up to and including death.  Patient understand the risks,  benefits and expectations and wishes to proceed with surgery.   PCP: Hulan Fess, MD   Discharged Condition: good  Hospital Course:  Patient underwent the above stated procedure on 08/02/2017. Patient tolerated the procedure well and brought to the recovery room in good condition and subsequently to the floor.  POD #1 BP: 117/76 ; Pulse: 52 ; Temp: 97.7 F (36.5 C) ; Resp: 15 Patient reports pain as mild, pain controlled.  No events throughout the night. Ready to be discharged home. Neurovascular intact and incision: dressing C/D/I.   LABS  Basename    HGB     11.7  HCT     36.5    Discharge Exam: General appearance: alert, cooperative and no distress Extremities: Homans sign is negative, no sign of DVT, no edema, redness or tenderness in the calves or thighs and no ulcers, gangrene or trophic changes  Disposition: Home with follow up in 2 weeks   Follow-up Information    Paralee Cancel, MD. Schedule an appointment as soon as possible for a visit in 2 week(s).   Specialty:  Orthopedic Surgery Contact information: [address] [place] [address] [phone]           Discharge Instructions    Call MD / Call 911   Complete by:  As directed    If you experience chest pain or shortness of breath, CALL 911 and be transported to the hospital emergency room.  If you develope a fever above 101 F, pus (white drainage) or increased drainage or redness at the wound, or calf pain, call  your surgeon's office.   Change dressing   Complete by:  As directed    Maintain surgical dressing until follow up in the clinic. If the edges start to pull up, may reinforce with tape. If the dressing is no longer working, may remove and cover with gauze and tape, but must keep the area dry and clean.  Call with any questions or concerns.   Constipation Prevention   Complete by:  As directed    Drink plenty of fluids.  Prune juice may be helpful.  You may use a stool softener,  such as Colace (over the counter) 100 mg twice a day.  Use MiraLax (over the counter) for constipation as needed.   Diet - low sodium heart healthy   Complete by:  As directed    Discharge instructions   Complete by:  As directed    Maintain surgical dressing until follow up in the clinic. If the edges start to pull up, may reinforce with tape. If the dressing is no longer working, may remove and cover with gauze and tape, but must keep the area dry and clean.  Follow up in 2 weeks at [facility]. Call with any questions or concerns.   Increase activity slowly as tolerated   Complete by:  As directed    Weight bearing as tolerated with assist device (walker, cane, etc) as directed, use it as long as suggested by your surgeon or therapist, typically at least 4-6 weeks.   TED hose   Complete by:  As directed    Use stockings (TED hose) for 2 weeks on both leg(s).  You may remove them at night for sleeping.      Allergies as of 08/03/2017      Reactions   Aldomet [methyldopa]    Rash and itching   Plavix [clopidogrel Bisulfate]    Rash and itching   Aldactone [spironolactone] Rash      Medication List    STOP taking these medications   acetaminophen 500 MG tablet Commonly known as:  TYLENOL   aspirin EC 81 MG tablet Replaced by:  aspirin 81 MG chewable tablet     TAKE these medications   aspirin 81 MG chewable tablet Commonly known as:  ASPIRIN CHILDRENS Chew 1 tablet (81 mg total) by mouth 2 (two) times daily. Take for 4 weeks, then resume regular dose. Replaces:  aspirin EC 81 MG tablet   atorvastatin 80 MG tablet Commonly known as:  LIPITOR Take 80 mg by mouth daily.   beta carotene w/minerals tablet Take 1 tablet by mouth daily.   docusate sodium 100 MG capsule Commonly known as:  COLACE Take 1 capsule (100 mg total) by mouth 2 (two) times daily.   doxazosin 8 MG tablet Commonly known as:  CARDURA Take 8 mg by mouth at bedtime.   ferrous sulfate 325  (65 FE) MG tablet Commonly known as:  FERROUSUL Take 1 tablet (325 mg total) by mouth 3 (three) times daily with meals.   HYDROcodone-acetaminophen 7.5-325 MG tablet Commonly known as:  NORCO Take 1-2 tablets by mouth every 4 (four) hours as needed for moderate pain.   levofloxacin 500 MG tablet Commonly known as:  LEVAQUIN Take 500 mg by mouth daily. Will finish medication on Thursday 07/29/2017   LUBRICATING EYE DROPS OP Apply 1 drop to eye daily as needed (irritation).   methocarbamol 500 MG tablet Commonly known as:  ROBAXIN Take 1 tablet (500 mg total) by mouth every 6 (six) hours  as needed for muscle spasms.   multivitamin with minerals Tabs tablet Take 1 tablet by mouth daily.   omeprazole 40 MG capsule Commonly known as:  PRILOSEC Take 40 mg by mouth daily.   polyethylene glycol packet Commonly known as:  MIRALAX / GLYCOLAX Take 17 g by mouth 2 (two) times daily.   quinapril 20 MG tablet Commonly known as:  ACCUPRIL Take 1.5 tablets (30 mg total) by mouth daily.            Discharge Care Instructions  (From admission, onward)        Start     Ordered   08/03/17 0000  Change dressing    Comments:  Maintain surgical dressing until follow up in the clinic. If the edges start to pull up, may reinforce with tape. If the dressing is no longer working, may remove and cover with gauze and tape, but must keep the area dry and clean.  Call with any questions or concerns.   08/03/17 1011       Signed: West Pugh. Beva Remund   PA-C  08/03/2017, 12:40 PM

## 2017-08-03 NOTE — Progress Notes (Signed)
Physical Therapy Treatment Patient Details Name: Bruce [name] MRN: [id number] DOB: 02-02-1936 Today's Date: 08/03/2017    History of Present Illness 82 yo male s/p L uni knee arthroplasty 08/02/17.     PT Comments    Progressing well with mobility. Reviewed/practiced exercises, gait training, and stair training. Issued HEP for pt to perform 2x/day until he begins OP PT. Okay to d/c from PT standpoint-made RN aware.    Follow Up Recommendations  Follow surgeon's recommendation for DC plan and follow-up therapies     Equipment Recommendations  None recommended by PT    Recommendations for Other Services       Precautions / Restrictions Precautions Precautions: Fall;Knee Restrictions Weight Bearing Restrictions: No LLE Weight Bearing: Weight bearing as tolerated    Mobility  Bed Mobility Overal bed mobility: Needs Assistance Bed Mobility: Supine to Sit;Sit to Supine     Supine to sit: Supervision Sit to supine: Supervision   General bed mobility comments: for safety.  Transfers Overall transfer level: Needs assistance Equipment used: Rolling walker (2 wheeled) Transfers: Sit to/from Stand Sit to Stand: Supervision         General transfer comment: for safety. VCS safety, hand/LE placement  Ambulation/Gait Ambulation/Gait assistance: Min guard Ambulation Distance (Feet): 100 Feet Assistive device: Rolling walker (2 wheeled) Gait Pattern/deviations: Step-to pattern     General Gait Details: close guard for safety. VCs safety, sequence.    Stairs Stairs: Yes Min assist Stair Management: Step to pattern;Forwards;Two rails Number of Stairs: 2 General stair comments: VCs safety, technique, sequence. Assist to steady. Wife present to observe and assist as needed.   Wheelchair Mobility    Modified Rankin (Stroke Patients Only)       Balance Overall balance assessment: Mild deficits observed, not formally tested                                          Cognition Arousal/Alertness: Awake/alert Behavior During Therapy: WFL for tasks assessed/performed Overall Cognitive Status: Within Functional Limits for tasks assessed                                        Exercises Total Joint Exercises Ankle Circles/Pumps: AROM;Both;10 reps;Supine Quad Sets: AROM;Both;10 reps;Supine Hip ABduction/ADduction: AROM;Left;10 reps;Supine Straight Leg Raises: AROM;Left;10 reps;Supine Knee Flexion: AAROM;Left;10 reps;Seated Goniometric ROM: ~10-85 degrees    General Comments        Pertinent Vitals/Pain Pain Assessment: 0-10 Pain Score: 7  Pain Location: L knee Pain Descriptors / Indicators: Sore;Aching Pain Intervention(s): Monitored during session;RN gave pain meds during session;Ice applied    Home Living                      Prior Function            PT Goals (current goals can now be found in the care plan section) Progress towards PT goals: Progressing toward goals    Frequency    7X/week      PT Plan Current plan remains appropriate    Co-evaluation              AM-PAC PT "6 Clicks" Daily Activity  Outcome Measure  Difficulty turning over in bed (including adjusting bedclothes, sheets and blankets)?: A Little Difficulty moving from lying on back to  sitting on the side of the bed? : A Little Difficulty sitting down on and standing up from a chair with arms (e.g., wheelchair, bedside commode, etc,.)?: A Little Help needed moving to and from a bed to chair (including a wheelchair)?: A Little Help needed walking in hospital room?: A Little Help needed climbing 3-5 steps with a railing? : A Little 6 Click Score: 18    End of Session Equipment Utilized During Treatment: Gait belt Activity Tolerance: Patient tolerated treatment well Patient left: in chair;with call bell/phone within reach;with family/visitor present   PT Visit Diagnosis: Difficulty in walking, not  elsewhere classified (R26.2);Pain Pain - Right/Left: Left Pain - part of body: Knee     Time: 4696-2952 PT Time Calculation (min) (ACUTE ONLY): 17 min  Charges:  $Gait Training: 8-22 mins                    G Codes:          Weston Anna, MPT Pager: [phone]

## 2017-08-03 NOTE — Progress Notes (Signed)
Patient ID: Bruce [name], male   DOB: 03-19-1936, 82 y.o.   MRN: [id number] Subjective: 1 Day Post-Op Procedure(s) (LRB): LEFT UNICOMPARTMENTAL KNEE (Left)    Patient reports pain as mild.  Objective:   VITALS:   Vitals:   08/03/17 0041 08/03/17 0546  BP: (!) 135/58 117/76  Pulse: (!) 52   Resp: 14 15  Temp: 98.2 F (36.8 C) 97.7 F (36.5 C)  SpO2: 96% 96%    Neurovascular intact Incision: dressing C/D/I  LABS Recent Labs    08/03/17 0608  HGB 11.7*  HCT 36.5*  WBC 10.6*  PLT 142*    Recent Labs    08/03/17 0608  NA 139  K 4.3  BUN 20  CREATININE 1.35*  GLUCOSE 108*    No results for input(s): LABPT, INR in the last 72 hours.   Assessment/Plan: 1 Day Post-Op Procedure(s) (LRB): LEFT UNICOMPARTMENTAL KNEE (Left)   Advance diet Up with therapy  Home today after therapy Will set up outpt PT through our office Reviewed goals and exercises to be done until then RTC in 2 weeks

## 2017-08-05 DIAGNOSIS — M25662 Stiffness of left knee, not elsewhere classified: Secondary | ICD-10-CM | POA: Diagnosis not present

## 2017-08-10 DIAGNOSIS — M25662 Stiffness of left knee, not elsewhere classified: Secondary | ICD-10-CM | POA: Diagnosis not present

## 2017-08-12 DIAGNOSIS — M25662 Stiffness of left knee, not elsewhere classified: Secondary | ICD-10-CM | POA: Diagnosis not present

## 2017-08-17 DIAGNOSIS — M25662 Stiffness of left knee, not elsewhere classified: Secondary | ICD-10-CM | POA: Diagnosis not present

## 2017-08-19 DIAGNOSIS — M25662 Stiffness of left knee, not elsewhere classified: Secondary | ICD-10-CM | POA: Diagnosis not present

## 2017-09-16 DIAGNOSIS — Z4733 Aftercare following explantation of knee joint prosthesis: Secondary | ICD-10-CM | POA: Diagnosis not present

## 2017-11-13 DIAGNOSIS — I10 Essential (primary) hypertension: Secondary | ICD-10-CM | POA: Diagnosis not present

## 2017-11-13 DIAGNOSIS — Z7902 Long term (current) use of antithrombotics/antiplatelets: Secondary | ICD-10-CM | POA: Diagnosis not present

## 2017-11-13 DIAGNOSIS — N183 Chronic kidney disease, stage 3 (moderate): Secondary | ICD-10-CM | POA: Diagnosis not present

## 2017-11-13 DIAGNOSIS — R002 Palpitations: Secondary | ICD-10-CM | POA: Diagnosis not present

## 2017-11-13 DIAGNOSIS — R0602 Shortness of breath: Secondary | ICD-10-CM | POA: Diagnosis not present

## 2017-11-13 DIAGNOSIS — I129 Hypertensive chronic kidney disease with stage 1 through stage 4 chronic kidney disease, or unspecified chronic kidney disease: Secondary | ICD-10-CM | POA: Diagnosis not present

## 2017-11-13 DIAGNOSIS — E860 Dehydration: Secondary | ICD-10-CM | POA: Diagnosis not present

## 2017-11-13 DIAGNOSIS — R079 Chest pain, unspecified: Secondary | ICD-10-CM | POA: Diagnosis not present

## 2017-11-13 DIAGNOSIS — D696 Thrombocytopenia, unspecified: Secondary | ICD-10-CM | POA: Diagnosis not present

## 2017-11-13 DIAGNOSIS — I25119 Atherosclerotic heart disease of native coronary artery with unspecified angina pectoris: Secondary | ICD-10-CM | POA: Diagnosis not present

## 2017-11-13 DIAGNOSIS — R42 Dizziness and giddiness: Secondary | ICD-10-CM | POA: Diagnosis not present

## 2017-11-14 DIAGNOSIS — E860 Dehydration: Secondary | ICD-10-CM | POA: Diagnosis not present

## 2017-11-14 DIAGNOSIS — N183 Chronic kidney disease, stage 3 (moderate): Secondary | ICD-10-CM | POA: Diagnosis not present

## 2017-11-14 DIAGNOSIS — I10 Essential (primary) hypertension: Secondary | ICD-10-CM | POA: Diagnosis not present

## 2017-11-14 DIAGNOSIS — R079 Chest pain, unspecified: Secondary | ICD-10-CM | POA: Diagnosis not present

## 2017-11-26 DIAGNOSIS — J069 Acute upper respiratory infection, unspecified: Secondary | ICD-10-CM | POA: Diagnosis not present

## 2017-12-02 DIAGNOSIS — R899 Unspecified abnormal finding in specimens from other organs, systems and tissues: Secondary | ICD-10-CM | POA: Diagnosis not present

## 2017-12-02 DIAGNOSIS — N189 Chronic kidney disease, unspecified: Secondary | ICD-10-CM | POA: Diagnosis not present

## 2017-12-02 DIAGNOSIS — R0681 Apnea, not elsewhere classified: Secondary | ICD-10-CM | POA: Diagnosis not present

## 2017-12-02 DIAGNOSIS — R05 Cough: Secondary | ICD-10-CM | POA: Diagnosis not present

## 2017-12-02 DIAGNOSIS — R002 Palpitations: Secondary | ICD-10-CM | POA: Diagnosis not present

## 2018-01-06 DIAGNOSIS — N189 Chronic kidney disease, unspecified: Secondary | ICD-10-CM | POA: Diagnosis not present

## 2018-01-06 DIAGNOSIS — Z8719 Personal history of other diseases of the digestive system: Secondary | ICD-10-CM | POA: Diagnosis not present

## 2018-01-06 DIAGNOSIS — Z0000 Encounter for general adult medical examination without abnormal findings: Secondary | ICD-10-CM | POA: Diagnosis not present

## 2018-01-06 DIAGNOSIS — Z87442 Personal history of urinary calculi: Secondary | ICD-10-CM | POA: Diagnosis not present

## 2018-01-06 DIAGNOSIS — H3530 Unspecified macular degeneration: Secondary | ICD-10-CM | POA: Diagnosis not present

## 2018-01-06 DIAGNOSIS — I10 Essential (primary) hypertension: Secondary | ICD-10-CM | POA: Diagnosis not present

## 2018-01-06 DIAGNOSIS — I25119 Atherosclerotic heart disease of native coronary artery with unspecified angina pectoris: Secondary | ICD-10-CM | POA: Diagnosis not present

## 2018-01-06 DIAGNOSIS — K219 Gastro-esophageal reflux disease without esophagitis: Secondary | ICD-10-CM | POA: Diagnosis not present

## 2018-01-06 DIAGNOSIS — E7800 Pure hypercholesterolemia, unspecified: Secondary | ICD-10-CM | POA: Diagnosis not present

## 2018-02-08 DIAGNOSIS — N139 Obstructive and reflux uropathy, unspecified: Secondary | ICD-10-CM | POA: Diagnosis not present

## 2018-02-08 DIAGNOSIS — Z125 Encounter for screening for malignant neoplasm of prostate: Secondary | ICD-10-CM | POA: Diagnosis not present

## 2018-02-08 DIAGNOSIS — N5201 Erectile dysfunction due to arterial insufficiency: Secondary | ICD-10-CM | POA: Diagnosis not present

## 2018-02-08 DIAGNOSIS — N400 Enlarged prostate without lower urinary tract symptoms: Secondary | ICD-10-CM | POA: Diagnosis not present

## 2018-02-14 DIAGNOSIS — R0681 Apnea, not elsewhere classified: Secondary | ICD-10-CM | POA: Diagnosis not present

## 2018-02-14 DIAGNOSIS — G4719 Other hypersomnia: Secondary | ICD-10-CM | POA: Diagnosis not present

## 2018-02-14 DIAGNOSIS — R0683 Snoring: Secondary | ICD-10-CM | POA: Diagnosis not present

## 2018-02-15 DIAGNOSIS — E785 Hyperlipidemia, unspecified: Secondary | ICD-10-CM | POA: Diagnosis not present

## 2018-02-15 DIAGNOSIS — N189 Chronic kidney disease, unspecified: Secondary | ICD-10-CM | POA: Diagnosis not present

## 2018-02-15 DIAGNOSIS — I129 Hypertensive chronic kidney disease with stage 1 through stage 4 chronic kidney disease, or unspecified chronic kidney disease: Secondary | ICD-10-CM | POA: Diagnosis not present

## 2018-02-15 DIAGNOSIS — Z23 Encounter for immunization: Secondary | ICD-10-CM | POA: Diagnosis not present

## 2018-02-15 DIAGNOSIS — N183 Chronic kidney disease, stage 3 (moderate): Secondary | ICD-10-CM | POA: Diagnosis not present

## 2018-02-21 ENCOUNTER — Encounter: Payer: Self-pay | Admitting: Cardiology

## 2018-02-21 ENCOUNTER — Ambulatory Visit: Payer: Medicare HMO | Admitting: Cardiology

## 2018-02-21 VITALS — BP 142/90 | HR 95 | Ht 65.0 in | Wt 177.0 lb

## 2018-02-21 DIAGNOSIS — I10 Essential (primary) hypertension: Secondary | ICD-10-CM

## 2018-02-21 DIAGNOSIS — R002 Palpitations: Secondary | ICD-10-CM

## 2018-02-21 DIAGNOSIS — R42 Dizziness and giddiness: Secondary | ICD-10-CM | POA: Diagnosis not present

## 2018-02-21 DIAGNOSIS — I2510 Atherosclerotic heart disease of native coronary artery without angina pectoris: Secondary | ICD-10-CM | POA: Diagnosis not present

## 2018-02-21 NOTE — Progress Notes (Signed)
Cardiology Office Note:    Date:  02/21/2018   ID:  Bruce [name], DOB [birth year], MRN [id number]  PCP:  Hulan Fess, MD  Cardiologist:  No primary care provider on file.  Electrophysiologist:  None   Referring MD: Hulan Fess, MD     History of Present Illness:    Bruce [name] is a 82 y.o. male with coronary artery disease here for follow-up, evaluation of dizziness after hospitalization at [facility] suggesting he see his cardiologist.  Had a stent placed in 2002 and in 2013 patent stent during catheterization was noted with a EF of 50%.  In 2017 had an echocardiogram and a stress test that showed no ischemia with a small inferior infarct pattern.  In June he was at [place] [facility] with palpitations and lightheadedness.  [place] the mall and felt his heart irregular, palpitations and lightheadedness for several minutes, no chest pain.  He did admit to drinking 3 cups of coffee that morning.  No decongestants such as Sudafed.  His chest x-ray done in the ER which showed mild cardiomegaly but no acute process and he was admitted overnight.  EKG showed nonspecific T wave changes.  Lab work creatinine 1.4 which was stable.  IV fluids were given for hydration.  It was felt ultimately that his symptoms were from hydration.  BUN was slightly elevated at 22.  A nuclear stress test was performed with no ischemia, normal EF of 55%.  Once again this was in June 2019.  After this he developed a cough wheezing never smoked had some results with Z-Pak in the past.  Blood pressure was great.  He requested a nephrology referral from Dr. it will as well.  He was also referred for sleep study with Dr. Elenore Rota.  Hemoglobin 14.4, creatinine 1.37 stable at last check.  An EKG performed at Dr. Eddie Dibbles office on 07/25/2015 personally reviewed shows sinus tachycardia rate 102 with nonspecific ST-T wave changes.  EKG prior to this showed sinus bradycardia rate 57.  Feels swimmy headed  even with 140/90.  Fairly constant.  No fevers chills nausea vomiting syncope.  No chest pain.  Past Medical History:  Diagnosis Date  . Arthritis   . Coronary artery disease    ETT-Myoview EF 51%, inf infarct, no ischemia, low risk  . History of echocardiogram    Echo 3/17: EF 55-60%, inferior HK, grade 1 diastolic dysfunctione  . History of kidney stones   . Hypertension   . Myocardial infarction [facility]) 2000   with stent    Past Surgical History:  Procedure Laterality Date  . CORONARY ANGIOPLASTY    . EYE SURGERY     bilateral cataract surgery with lens implant  . KNEE SURGERY    . LEFT HEART CATHETERIZATION WITH CORONARY ANGIOGRAM N/A 07/01/2011   Procedure: LEFT HEART CATHETERIZATION WITH CORONARY ANGIOGRAM;  Surgeon: Candee Furbish, MD;  Location: [facility] CATH LAB;  Service: Cardiovascular;  Laterality: N/A;  . left wrist surgery     left wrist fracture  . PARTIAL KNEE ARTHROPLASTY Left 08/02/2017   Procedure: LEFT UNICOMPARTMENTAL KNEE;  Surgeon: Paralee Cancel, MD;  Location: [facility] ORS;  Service: Orthopedics;  Laterality: Left;    Current Medications: Current Meds  Medication Sig  . atorvastatin (LIPITOR) 80 MG tablet Take 80 mg by mouth daily.    . beta carotene w/minerals (OCUVITE) tablet Take 1 tablet by mouth daily.  Marland Kitchen doxazosin (CARDURA) 8 MG tablet Take 8 mg by mouth at  bedtime.    . Multiple Vitamin (MULTIVITAMIN WITH MINERALS) TABS tablet Take 1 tablet by mouth daily.  Marland Kitchen omeprazole (PRILOSEC) 40 MG capsule Take 40 mg by mouth daily.  . quinapril (ACCUPRIL) 20 MG tablet Take 1.5 tablets (30 mg total) by mouth daily.     Allergies:   Aldomet [methyldopa]; Plavix [clopidogrel bisulfate]; and Aldactone [spironolactone]   Social History   Socioeconomic History  . Marital status: Married    Spouse name: Not on file  . Number of children: Not on file  . Years of education: Not on file  . Highest education level: Not on file  Occupational History  . Not on file  Social  Needs  . Financial resource strain: Not on file  . Food insecurity:    Worry: Not on file    Inability: Not on file  . Transportation needs:    Medical: Not on file    Non-medical: Not on file  Tobacco Use  . Smoking status: Never Smoker  . Smokeless tobacco: Never Used  Substance and Sexual Activity  . Alcohol use: No  . Drug use: No  . Sexual activity: Yes  Lifestyle  . Physical activity:    Days per week: Not on file    Minutes per session: Not on file  . Stress: Not on file  Relationships  . Social connections:    Talks on phone: Not on file    Gets together: Not on file    Attends religious service: Not on file    Active member of club or organization: Not on file    Attends meetings of clubs or organizations: Not on file    Relationship status: Not on file  Other Topics Concern  . Not on file  Social History Narrative  . Not on file     Family History: The patient's family history includes Coronary artery disease in his brother and father; Diabetes in his brother; Heart attack in his sister; Heart disease in his brother; Hyperlipidemia in his sister; Kidney disease in his sister.  ROS:   Please see the history of present illness.     All other systems reviewed and are negative.  EKGs/Labs/Other Studies Reviewed:    The following studies were reviewed today: Extensive review of medical records from [facility] as described in HPI, lab work, stress test, echocardiogram, EKG  EKG:  EKG is not ordered today.  See above for other details  Recent Labs: 08/03/2017: BUN 20; Creatinine, Ser 1.35; Hemoglobin 11.7; Platelets 142; Potassium 4.3; Sodium 139  Recent Lipid Panel    Component Value Date/Time   CHOL  04/30/2009 0535    135        ATP III CLASSIFICATION:  <200     mg/dL   Desirable  200-239  mg/dL   Borderline High  >=240    mg/dL   High          TRIG 70 04/30/2009 0535   HDL 43 04/30/2009 0535   CHOLHDL 3.1 04/30/2009 0535   VLDL 14 04/30/2009 0535    LDLCALC  04/30/2009 0535    78        Total Cholesterol/HDL:CHD Risk Coronary Heart Disease Risk Table                     Men   Women  1/2 Average Risk   3.4   3.3  Average Risk       5.0   4.4  2 X  Average Risk   9.6   7.1  3 X Average Risk  23.4   11.0        Use the calculated Patient Ratio above and the CHD Risk Table to determine the patient's CHD Risk.        ATP III CLASSIFICATION (LDL):  <100     mg/dL   Optimal  100-129  mg/dL   Near or Above                    Optimal  130-159  mg/dL   Borderline  160-189  mg/dL   High  >190     mg/dL   Very High    Physical Exam:    VS:  BP (!) 142/90   Pulse 95   Ht 5\' 5"  (1.651 m)   Wt 177 lb (80.3 kg)   SpO2 98%   BMI 29.45 kg/m     Wt Readings from Last 3 Encounters:  02/21/18 177 lb (80.3 kg)  08/02/17 175 lb (79.4 kg)  07/27/17 175 lb (79.4 kg)     GEN:  Well nourished, well developed in no acute distress HEENT: Normal NECK: No JVD; No carotid bruits LYMPHATICS: No lymphadenopathy CARDIAC: RRR, no murmurs, rubs, gallops RESPIRATORY:  Clear to auscultation without rales, wheezing or rhonchi  ABDOMEN: Soft, non-tender, non-distended MUSCULOSKELETAL:  No edema; No deformity  SKIN: Warm and dry NEUROLOGIC:  Alert and oriented x 3 PSYCHIATRIC:  Normal affect   ASSESSMENT:    1. Palpitations   2. Coronary artery disease involving native coronary artery of native heart without angina pectoris   3. Essential hypertension   4. Dizziness    PLAN:    In order of problems listed above:  Coronary artery disease -RCA stent 2002, widely patent.  Stress test June 2019 in [facility] low risk, reassuring, no ischemia.  Continue with secondary risk factor prevention.  Last aortic root 37 mm on echocardiogram.  Reassuring.  Palpitations - At the request of Dr. Rex Kras, we will go ahead and place a long-term monitor on him for 14 days to see if we can pick up on any adverse arrhythmias.  During his hospitalization  there were none.  His heart rate is within normal range currently.  Dizziness/swimmy headed - Thankfully today, his blood pressure is 140/90 and he is still having those symptoms which tell us that this is a noncardiac etiology.  I know this is frustrating for him.  Be very careful when walking.  Could be many different etiologies including inner ear for instance.  Continue with work-up with Dr. Rex Kras.  Hyperlipidemia - Continue with atorvastatin.  High intensity dose given his coronary disease.  Excellent.  Last LDL 93.  ALT 25.  Essential hypertension - On quinapril 30 mg a day.  Creatinine mildly elevated 1.4    1 year follow-up.  Of course we will give him the results of his monitor.  Medication Adjustments/Labs and Tests Ordered: Current medicines are reviewed at length with the patient today.  Concerns regarding medicines are outlined above.  Orders Placed This Encounter  Procedures  . LONG TERM MONITOR (3-14 DAYS)   No orders of the defined types were placed in this encounter.   Patient Instructions  Medication Instructions:  The current medical regimen is effective;  continue present plan and medications.  If you need a refill on your cardiac medications before your next appointment, please call your pharmacy.   Testing/Procedures: Your physician has recommended that  you wear an event monitor for 14 days. Event monitors are medical devices that record the heart's electrical activity. Doctors most often [place] these monitors to diagnose arrhythmias. Arrhythmias are problems with the speed or rhythm of the heartbeat. The monitor is a small, portable device. You can wear one while you do your normal daily activities. This is usually used to diagnose what is causing palpitations/syncope (passing out).  Follow-Up: At [facility], you and your health needs are our priority.  As part of our continuing mission to provide you with exceptional heart care, we have created designated  Provider Care Teams.  These Care Teams include your primary Cardiologist (physician) and Advanced Practice Providers (APPs -  Physician Assistants and Nurse Practitioners) who all work together to provide you with the care you need, when you need it. You will need a follow up appointment in 12 months.  Please call our office 2 months in advance to schedule this appointment.  You may see Dr Marlou Porch or one of the following Advanced Practice Providers on your designated Care Team:   Truitt Merle, NP Cecilie Kicks, NP . Kathyrn Drown, NP  Thank you for choosing [facility]!!        Signed, Candee Furbish, MD  02/21/2018 9:56 AM    [place]

## 2018-02-21 NOTE — Patient Instructions (Signed)
Medication Instructions:  The current medical regimen is effective;  continue present plan and medications.  If you need a refill on your cardiac medications before your next appointment, please call your pharmacy.   Testing/Procedures: Your physician has recommended that you wear an event monitor for 14 days. Event monitors are medical devices that record the heart's electrical activity. Doctors most often [place] these monitors to diagnose arrhythmias. Arrhythmias are problems with the speed or rhythm of the heartbeat. The monitor is a small, portable device. You can wear one while you do your normal daily activities. This is usually used to diagnose what is causing palpitations/syncope (passing out).  Follow-Up: At [facility], you and your health needs are our priority.  As part of our continuing mission to provide you with exceptional heart care, we have created designated Provider Care Teams.  These Care Teams include your primary Cardiologist (physician) and Advanced Practice Providers (APPs -  Physician Assistants and Nurse Practitioners) who all work together to provide you with the care you need, when you need it. You will need a follow up appointment in 12 months.  Please call our office 2 months in advance to schedule this appointment.  You may see Dr Marlou Porch or one of the following Advanced Practice Providers on your designated Care Team:   Truitt Merle, NP Cecilie Kicks, NP . Kathyrn Drown, NP  Thank you for choosing [facility]!!

## 2018-02-22 DIAGNOSIS — G4733 Obstructive sleep apnea (adult) (pediatric): Secondary | ICD-10-CM | POA: Diagnosis not present

## 2018-02-23 DIAGNOSIS — G4733 Obstructive sleep apnea (adult) (pediatric): Secondary | ICD-10-CM | POA: Diagnosis not present

## 2018-02-25 ENCOUNTER — Ambulatory Visit (INDEPENDENT_AMBULATORY_CARE_PROVIDER_SITE_OTHER): Payer: Medicare HMO

## 2018-02-25 DIAGNOSIS — R002 Palpitations: Secondary | ICD-10-CM

## 2018-03-15 DIAGNOSIS — R002 Palpitations: Secondary | ICD-10-CM | POA: Diagnosis not present

## 2018-03-22 DIAGNOSIS — G4733 Obstructive sleep apnea (adult) (pediatric): Secondary | ICD-10-CM | POA: Diagnosis not present

## 2018-04-04 DIAGNOSIS — R05 Cough: Secondary | ICD-10-CM | POA: Diagnosis not present

## 2018-04-04 DIAGNOSIS — J209 Acute bronchitis, unspecified: Secondary | ICD-10-CM | POA: Diagnosis not present

## 2018-04-21 DIAGNOSIS — G4733 Obstructive sleep apnea (adult) (pediatric): Secondary | ICD-10-CM | POA: Diagnosis not present

## 2018-05-13 DIAGNOSIS — G4733 Obstructive sleep apnea (adult) (pediatric): Secondary | ICD-10-CM | POA: Diagnosis not present

## 2018-05-22 DIAGNOSIS — G4733 Obstructive sleep apnea (adult) (pediatric): Secondary | ICD-10-CM | POA: Diagnosis not present

## 2018-05-23 DIAGNOSIS — G4733 Obstructive sleep apnea (adult) (pediatric): Secondary | ICD-10-CM | POA: Diagnosis not present

## 2018-05-30 ENCOUNTER — Encounter (INDEPENDENT_AMBULATORY_CARE_PROVIDER_SITE_OTHER): Payer: Medicare HMO | Admitting: Ophthalmology

## 2018-05-30 DIAGNOSIS — H43813 Vitreous degeneration, bilateral: Secondary | ICD-10-CM

## 2018-05-30 DIAGNOSIS — H35372 Puckering of macula, left eye: Secondary | ICD-10-CM

## 2018-05-30 DIAGNOSIS — H353132 Nonexudative age-related macular degeneration, bilateral, intermediate dry stage: Secondary | ICD-10-CM | POA: Diagnosis not present

## 2018-05-30 DIAGNOSIS — I10 Essential (primary) hypertension: Secondary | ICD-10-CM | POA: Diagnosis not present

## 2018-05-30 DIAGNOSIS — H35033 Hypertensive retinopathy, bilateral: Secondary | ICD-10-CM | POA: Diagnosis not present

## 2018-06-08 DIAGNOSIS — I10 Essential (primary) hypertension: Secondary | ICD-10-CM | POA: Diagnosis not present

## 2018-06-10 DIAGNOSIS — N183 Chronic kidney disease, stage 3 (moderate): Secondary | ICD-10-CM | POA: Diagnosis not present

## 2018-06-22 DIAGNOSIS — G4733 Obstructive sleep apnea (adult) (pediatric): Secondary | ICD-10-CM | POA: Diagnosis not present

## 2018-06-29 DIAGNOSIS — N183 Chronic kidney disease, stage 3 (moderate): Secondary | ICD-10-CM | POA: Diagnosis not present

## 2018-06-29 DIAGNOSIS — I129 Hypertensive chronic kidney disease with stage 1 through stage 4 chronic kidney disease, or unspecified chronic kidney disease: Secondary | ICD-10-CM | POA: Diagnosis not present

## 2018-07-01 ENCOUNTER — Ambulatory Visit: Payer: Medicare HMO | Admitting: Cardiology

## 2018-07-01 ENCOUNTER — Encounter: Payer: Self-pay | Admitting: Cardiology

## 2018-07-01 VITALS — BP 142/86 | HR 95 | Ht 65.0 in | Wt 176.0 lb

## 2018-07-01 DIAGNOSIS — I2510 Atherosclerotic heart disease of native coronary artery without angina pectoris: Secondary | ICD-10-CM | POA: Diagnosis not present

## 2018-07-01 DIAGNOSIS — I10 Essential (primary) hypertension: Secondary | ICD-10-CM | POA: Diagnosis not present

## 2018-07-01 DIAGNOSIS — R002 Palpitations: Secondary | ICD-10-CM | POA: Diagnosis not present

## 2018-07-01 DIAGNOSIS — I771 Stricture of artery: Secondary | ICD-10-CM | POA: Diagnosis not present

## 2018-07-01 MED ORDER — QUINAPRIL HCL 40 MG PO TABS: 40.0000 mg | ORAL_TABLET | Freq: Every day | ORAL | 3 refills | Status: DC

## 2018-07-01 NOTE — Progress Notes (Signed)
Cardiology Office Note:    Date:  07/01/2018   ID:  Bruce [name], DOB April 15, 1936, MRN [id number]  PCP:  Bruce Fess, MD  Cardiologist:  No primary care provider on file.  Electrophysiologist:  None   Referring MD: Bruce Fess, MD     History of Present Illness:    Bruce [name] is a 83 y.o. male with coronary artery disease here for follow-up of dizziness after hospitalization at [facility] suggesting he see his cardiologist.  Had a stent placed in 2002 and in 2013 patent stent during catheterization was noted with a EF of 50%.  In 2017 had an echocardiogram and a stress test that showed no ischemia with a small inferior infarct pattern.  In June he was at [facility] with palpitations and lightheadedness.  [place] the mall and felt his heart irregular, palpitations and lightheadedness for several minutes, no chest pain.  He did admit to drinking 3 cups of coffee that morning.  No decongestants such as Sudafed.  His chest x-ray done in the ER which showed mild cardiomegaly but no acute process and he was admitted overnight.  EKG showed nonspecific T wave changes.  Lab work creatinine 1.4 which was stable.  IV fluids were given for hydration.  It was felt ultimately that his symptoms were from hydration.  BUN was slightly elevated at 22.  A nuclear stress test was performed with no ischemia, normal EF of 55%.  Once again this was in June 2019.  After this he developed a cough wheezing never smoked had some results with Z-Pak in the past.  Blood pressure was great.  He requested a nephrology referral from Bruce [name] as well.  He was also referred for sleep study with Dr. Elenore [name].  Hemoglobin 14.4, creatinine 1.37 stable at last check.  An EKG performed at Dr. Eddie [name] office on 07/25/2015 personally reviewed shows sinus tachycardia rate 102 with nonspecific ST-T wave changes.  EKG prior to this showed sinus bradycardia rate 57.  Feels swimmy headed even with  140/90.  Fairly constant.  No fevers chills nausea vomiting syncope.  No chest pain.  07/01/2018- here for the follow-up of coronary artery disease palpitations dizziness and hyperlipidemia.  Prior RCA stent 2002.  Stress test June 2019 low risk.  Aortic root 37. Dr. Rex [name] BP higher in R>L   LDL 93 triglycerides 82 hemoglobin 14.6 creatinine 1.2 01/06/2018  Overall has been feeling fairly well however.  Still having some palpitations.  No chest pain no fevers chills nausea vomiting syncope bleeding.  Past Medical History:  Diagnosis Date  . Arthritis   . Coronary artery disease    ETT-Myoview EF 51%, inf infarct, no ischemia, low risk  . History of echocardiogram    Echo 3/17: EF 55-60%, inferior HK, grade 1 diastolic dysfunctione  . History of kidney stones   . Hypertension   . Myocardial infarction [facility]) 2000   with stent    Past Surgical History:  Procedure Laterality Date  . CORONARY ANGIOPLASTY    . EYE SURGERY     bilateral cataract surgery with lens implant  . KNEE SURGERY    . LEFT HEART CATHETERIZATION WITH CORONARY ANGIOGRAM N/A 07/01/2011   Procedure: LEFT HEART CATHETERIZATION WITH CORONARY ANGIOGRAM;  Surgeon: Bruce Furbish, MD;  Location: [facility] CATH LAB;  Service: Cardiovascular;  Laterality: N/A;  . left wrist surgery     left wrist fracture  . PARTIAL KNEE ARTHROPLASTY Left 08/02/2017  Procedure: LEFT UNICOMPARTMENTAL KNEE;  Surgeon: Bruce Cancel, MD;  Location: [facility] ORS;  Service: Orthopedics;  Laterality: Left;    Current Medications: Current Meds  Medication Sig  . atorvastatin (LIPITOR) 80 MG tablet Take 80 mg by mouth daily.    . beta carotene w/minerals (OCUVITE) tablet Take 1 tablet by mouth daily.  Marland Kitchen doxazosin (CARDURA) 8 MG tablet Take 8 mg by mouth at bedtime.    . Multiple Vitamin (MULTIVITAMIN WITH MINERALS) TABS tablet Take 1 tablet by mouth daily.  Marland Kitchen omeprazole (PRILOSEC) 40 MG capsule Take 40 mg by mouth daily.  . [DISCONTINUED] quinapril (ACCUPRIL)  20 MG tablet Take 1.5 tablets (30 mg total) by mouth daily.     Allergies:   Aldomet [methyldopa]; Plavix [clopidogrel bisulfate]; and Aldactone [spironolactone]   Social History   Socioeconomic History  . Marital status: Married    Spouse name: Not on file  . Number of children: Not on file  . Years of education: Not on file  . Highest education level: Not on file  Occupational History  . Not on file  Social Needs  . Financial resource strain: Not on file  . Food insecurity:    Worry: Not on file    Inability: Not on file  . Transportation needs:    Medical: Not on file    Non-medical: Not on file  Tobacco Use  . Smoking status: Never Smoker  . Smokeless tobacco: Never Used  Substance and Sexual Activity  . Alcohol use: No  . Drug use: No  . Sexual activity: Yes  Lifestyle  . Physical activity:    Days per week: Not on file    Minutes per session: Not on file  . Stress: Not on file  Relationships  . Social connections:    Talks on phone: Not on file    Gets together: Not on file    Attends religious service: Not on file    Active member of club or organization: Not on file    Attends meetings of clubs or organizations: Not on file    Relationship status: Not on file  Other Topics Concern  . Not on file  Social History Narrative  . Not on file     Family History: The patient's family history includes Coronary artery disease in his brother and father; Diabetes in his brother; Heart attack in his sister; Heart disease in his brother; Hyperlipidemia in his sister; Kidney disease in his sister.  ROS:   Please see the history of present illness.     All other systems reviewed and are negative.  EKGs/Labs/Other Studies Reviewed:    The following studies were reviewed today: Extensive review of medical records from [facility] as described in HPI, lab work, stress test, echocardiogram, EKG  Long term monitor 02/2018  Patient had a min HR of 27 bpm (3 beats  asymptomatic), max HR of 174 bpm, and avg HR of 71 bpm.  Predominant underlying rhythm was Sinus Rhythm.  First Degree AV Block was present (transient)  2 Ventricular Tachycardia runs occurred, the run with the fastest interval lasting 4 beats with a max rate of 174 bpm, the longest lasting 4 beats with an avg rate of 101 bpm.  8 Supraventricular Tachycardia runs occurred, the run with the fastest interval lasting 6 beats with a max rate of 128 bpm, the longest lasting 5 beats with an avg rate of 99 bpm.  Second Degree AV Block-Mobitz I (Wenckebach) was present. Isolated SVEs were  rare (<1.0%), SVE Couplets were rare (<1.0%), and SVE Triplets were rare (<1.0%). Isolated VEs were rare (<1.0%, 13340),  VE Couplets were rare (<1.0%, 40), and VE Triplets were rare (<1.0%, 1). Ventricular Bigeminy and Trigeminy were present.  No atrial fibrillation, no adverse arrhythmias   Overall reassuring monitor with no afib. No sustained VT (normal EF on ECHO), Palpitations are likely secondary rare paroxysmal atrial tachycardia or rare PVC/PAC's.  Could consider low dose diltiazem CD 120mg  PO QD to see if this helps reduce the skipped beats.   EKG:  EKG is not ordered today.  See above for other details  Recent Labs: 08/03/2017: BUN 20; Creatinine, Ser 1.35; Hemoglobin 11.7; Platelets 142; Potassium 4.3; Sodium 139  Recent Lipid Panel    Component Value Date/Time   CHOL  04/30/2009 0535    135        ATP III CLASSIFICATION:  <200     mg/dL   Desirable  200-239  mg/dL   Borderline High  >=240    mg/dL   High          TRIG 70 04/30/2009 0535   HDL 43 04/30/2009 0535   CHOLHDL 3.1 04/30/2009 0535   VLDL 14 04/30/2009 0535   LDLCALC  04/30/2009 0535    78        Total Cholesterol/HDL:CHD Risk Coronary Heart Disease Risk Table                     Men   Women  1/2 Average Risk   3.4   3.3  Average Risk       5.0   4.4  2 X Average Risk   9.6   7.1  3 X Average Risk  23.4   11.0        Use  the calculated Patient Ratio above and the CHD Risk Table to determine the patient's CHD Risk.        ATP III CLASSIFICATION (LDL):  <100     mg/dL   Optimal  100-129  mg/dL   Near or Above                    Optimal  130-159  mg/dL   Borderline  160-189  mg/dL   High  >190     mg/dL   Very High    Physical Exam:    VS:  BP (!) 142/86 (BP Location: Left Arm)   Pulse 95   Ht 5\' 5"  (1.651 m)   Wt 176 lb (79.8 kg)   SpO2 93%   BMI 29.29 kg/m     Wt Readings from Last 3 Encounters:  07/01/18 176 lb (79.8 kg)  02/21/18 177 lb (80.3 kg)  08/02/17 175 lb (79.4 kg)     GEN: Well nourished, well developed, in no acute distress  HEENT: normal  Neck: no JVD, carotid bruits, or masses Cardiac: RRR; no murmurs, rubs, or gallops,no edema  Respiratory:  clear to auscultation bilaterally, normal work of breathing GI: soft, nontender, nondistended, + BS MS: no deformity or atrophy  Skin: warm and dry, no rash Neuro:  Alert and Oriented x 3, Strength and sensation are intact Psych: euthymic mood, full affect   ASSESSMENT:    1. Stenosis of left subclavian artery (HCC)   2. Coronary artery disease involving native coronary artery of native heart without angina pectoris   3. Palpitations   4. Essential hypertension    PLAN:    In  order of problems listed above:  Coronary artery disease -RCA stent 2002, widely patent.  Stress test June 2019 in [facility] low risk, reassuring, no ischemia.  Continue with secondary risk factor prevention.  Last aortic root 37 mm on echocardiogram.  Reassuring.  Doing very well.  No anginal symptoms  Palpitations - PVCs, PAT but no A. fib on monitor.  Benign.  He is still feeling occasionally.  Could consider low-dose Toprol as a secondary agent to help both with palpitations or hypertension.  Dizziness/swimmy headed - Feels better, no significant symptoms currently.  Previously 140/90 and he is still having those symptoms which tell us that this  is a noncardiac etiology.  I know this was frustrating for him.  Be very careful when walking.  Could be many different etiologies including inner ear for instance.    Hyperlipidemia - Continue with atorvastatin.  High intensity dose given his coronary disease.  LDL is still above 70, 93 on last check.  Before starting another medication such as Zetia, let us work on diet  Essential hypertension - On quinapril 30 mg a day, I [name] increase to 40 mg a day.  Creatinine mildly elevated 1.23.  Continue to monitor his blood pressures.  His left arm was slightly less than his right arm by about 20 mmHg.  Left arm measurements range from 1 37-1 43 systolic.  Right arm measurements range from 1 06-2 63 systolic.  Because of this, I [name] check an upper extremity ultrasound to look for any evidence of subclavian stenosis on the left side.  I do not hear any bruits.  He does not have any symptoms of claudication.  I [name] have him follow-up as well with the hypertension clinic in 1 to 2 months .1 year follow-up  Medication Adjustments/Labs and Tests Ordered: Current medicines are reviewed at length with the patient today.  Concerns regarding medicines are outlined above.  No orders of the defined types were placed in this encounter.  Meds ordered this encounter  Medications  . quinapril (ACCUPRIL) 40 MG tablet    Sig: Take 1 tablet (40 mg total) by mouth at bedtime.    Dispense:  90 tablet    Refill:  3    Dose change    Patient Instructions  Medication Instructions:  1) INCREASE Quinapril to 40mg  once daily  If you need a refill on your cardiac medications before your next appointment, please call your pharmacy.   Lab work: None If you have labs (blood work) drawn today and your tests are completely normal, you [name] receive your results only by: Marland Kitchen MyChart Message (if you have MyChart) OR . A paper copy in the mail If you have any lab test that is abnormal or we need to change your treatment, we  [name] call you to review the results.  Testing/Procedures: None  Follow-Up: Your physician recommends that you schedule a follow-up appointment in: 1-2 months with the hypertension clinic.  At [facility], you and your health needs are our priority.  As part of our continuing mission to provide you with exceptional heart care, we have created designated Provider Care Teams.  These Care Teams include your primary Cardiologist (physician) and Advanced Practice Providers (APPs -  Physician Assistants and Nurse Practitioners) who all work together to provide you with the care you need, when you need it. You [name] need a follow up appointment in 12 months.  Please call our office 2 months in advance to schedule this appointment.  You may see Dr. Marlou Porch or one of the following Advanced Practice Providers on your designated Care Team:   Truitt Merle, NP Cecilie Kicks, NP . Kathyrn Drown, NP  Any Other Special Instructions [name] Be Listed Below (If Applicable).       Signed, Bruce Furbish, MD  07/01/2018 9:00 AM    [facility]

## 2018-07-01 NOTE — Patient Instructions (Addendum)
Medication Instructions:  1) INCREASE Quinapril to 40mg  once daily  If you need a refill on your cardiac medications before your next appointment, please call your pharmacy.   Lab work: None If you have labs (blood work) drawn today and your tests are completely normal, you will receive your results only by: Marland Kitchen MyChart Message (if you have MyChart) OR . A paper copy in the mail If you have any lab test that is abnormal or we need to change your treatment, we will call you to review the results.  Testing/Procedures: Your physician has requested that you have a lower or upper extremity arterial duplex. This test is an ultrasound of the arteries in the legs or arms. It looks at arterial blood flow in the legs and arms. Allow one hour for Lower and Upper Arterial scans. There are no restrictions or special instructions   Follow-Up: Your physician recommends that you schedule a follow-up appointment in: 1-2 months with the hypertension clinic.  At [facility], you and your health needs are our priority.  As part of our continuing mission to provide you with exceptional heart care, we have created designated Provider Care Teams.  These Care Teams include your primary Cardiologist (physician) and Advanced Practice Providers (APPs -  Physician Assistants and Nurse Practitioners) who all work together to provide you with the care you need, when you need it. You will need a follow up appointment in 12 months.  Please call our office 2 months in advance to schedule this appointment.  You may see Dr. Marlou Porch or one of the following Advanced Practice Providers on your designated Care Team:   Truitt Merle, NP Cecilie Kicks, NP . Kathyrn Drown, NP  Any Other Special Instructions Will Be Listed Below (If Applicable).

## 2018-07-04 ENCOUNTER — Other Ambulatory Visit: Payer: Self-pay | Admitting: *Deleted

## 2018-07-04 MED ORDER — QUINAPRIL HCL 40 MG PO TABS: 40.0000 mg | ORAL_TABLET | Freq: Every day | ORAL | 3 refills | Status: DC

## 2018-07-14 ENCOUNTER — Other Ambulatory Visit: Payer: Self-pay | Admitting: Cardiology

## 2018-07-14 DIAGNOSIS — I771 Stricture of artery: Secondary | ICD-10-CM

## 2018-07-18 ENCOUNTER — Other Ambulatory Visit (HOSPITAL_BASED_OUTPATIENT_CLINIC_OR_DEPARTMENT_OTHER): Payer: Self-pay

## 2018-07-18 DIAGNOSIS — G4733 Obstructive sleep apnea (adult) (pediatric): Secondary | ICD-10-CM

## 2018-07-21 ENCOUNTER — Ambulatory Visit (HOSPITAL_COMMUNITY)
Admission: RE | Admit: 2018-07-21 | Discharge: 2018-07-21 | Disposition: A | Discharge status: Home / Self Care | Payer: Medicare HMO | Source: Ambulatory Visit | Attending: Internal Medicine | Admitting: Internal Medicine

## 2018-07-21 DIAGNOSIS — G4733 Obstructive sleep apnea (adult) (pediatric): Secondary | ICD-10-CM | POA: Diagnosis not present

## 2018-07-21 DIAGNOSIS — I771 Stricture of artery: Secondary | ICD-10-CM | POA: Diagnosis not present

## 2018-07-22 ENCOUNTER — Telehealth: Payer: Self-pay

## 2018-07-22 NOTE — Telephone Encounter (Signed)
Notes recorded by Frederik Schmidt, RN on 07/22/2018 at 2:09 PM EST The patient has been notified of the result and verbalized understanding. All questions (if any) were answered. Frederik Schmidt, RN 07/22/2018 2:08 PM

## 2018-07-22 NOTE — Telephone Encounter (Signed)
-----   Message from Jerline Pain, MD sent at 07/22/2018  1:37 PM EST ----- No arterial obstruction in arms. Reassuring.  Candee Furbish, MD

## 2018-07-30 ENCOUNTER — Ambulatory Visit (HOSPITAL_BASED_OUTPATIENT_CLINIC_OR_DEPARTMENT_OTHER): Payer: Medicare HMO | Attending: Internal Medicine | Admitting: Internal Medicine

## 2018-07-30 VITALS — Ht 65.0 in | Wt 175.0 lb

## 2018-07-30 DIAGNOSIS — G4733 Obstructive sleep apnea (adult) (pediatric): Secondary | ICD-10-CM | POA: Insufficient documentation

## 2018-08-06 DIAGNOSIS — G4733 Obstructive sleep apnea (adult) (pediatric): Secondary | ICD-10-CM | POA: Diagnosis not present

## 2018-08-06 NOTE — Procedures (Signed)
   NAME: Bruce [name] DATE OF BIRTH:  1936-05-13 MEDICAL RECORD NUMBER [id number]  LOCATION: [facility]  PHYSICIAN: Marius Ditch  DATE OF STUDY: 07/30/2018  SLEEP STUDY TYPE: Positive Airway Pressure Titration               REFERRING PHYSICIAN: Marius Ditch, MD  INDICATION FOR STUDY: Inadequate airway control with APAP and possible development and persistence of CSA on APAP download  EPWORTH SLEEPINESS SCORE:  NA HEIGHT: 5\' 5"  (165.1 cm)  WEIGHT: 175 lb (79.4 kg)    Body mass index is 29.12 kg/m.  NECK SIZE:   in.  MEDICATIONS  Patient self administered medications include: N/A. Medications administered during study include Sleep medicine administered - Unspecified at 10:26:55 PM.   SLEEP STUDY TECHNIQUE  The patient underwent an attended overnight polysomnography titration to assess the effects of cpap therapy. The following variables were monitored: EEG(C4-A1, C3-A2, O1-A2, O2-A1), EOG, submental and leg EMG, ECG, oxyhemoglobin saturation by pulse oximetry, thoracic and abdominal respiratory effort belts, nasal/oral airflow by pressure sensor, body position sensor and snoring sensor. CPAP pressure was titrated to eliminate apneas, hypopneas and oxygen desaturation.   TECHNICAL COMMENTS  Comments added by Technician: Patient did tolerate cpap Comments added by Scorer: N/A   SLEEP ARCHITECTURE  The study was initiated at 10:05:46 PM and terminated at 6:28:23 AM. Total recorded time was 502.6 minutes. EEG confirmed total sleep time was 373 minutes yielding a sleep efficiency of 74.2%%. Sleep onset after lights out was 17.9 minutes with a REM latency of 157.5 minutes. The patient spent 18.5%% of the night in stage N1 sleep, 73.3%% in stage N2 sleep, 0.8%% in stage N3 and 7.4% in REM. The Arousal Index was 16.7/hour.   RESPIRATORY PARAMETERS  The overall AHI was 7.1 per hour, with a central apnea index of 1.1 per hour. Adequate pressure was achieved with  as AHI of 4/hr on CPAP 18 cm water. The sleep efficiency was 74.2% % and the patient was supine for 100.00%. The arousal index was 16.7 per hour.The oxygen nadir was 85.0% during sleep.  LEG MOVEMENT DATA  The total leg movements were 636 with a resulting leg movement index of 102.3/hr. Associated arousal with leg movement index was 5.6/hr.   CARDIAC DATA  The underlying cardiac rhythm was most consistent with sinus rhythm. Mean heart rate during sleep was 52.7 bpm. Additional rhythm abnormalities include PVCs.   IMPRESSIONS  Successful CPAP titration at a pressure of 18 cm water pressure.   DIAGNOSIS  Obstructive Sleep Apnea (327.23 [G47.33 ICD-10])  RECOMMENDATIONS  Auto CPAP should be set to 4-20. It was set at 4-18 prior to this study and events, possibly centrals, were seen. This titration suggests he will have adequatee airway control with APAP 4-20  Marius Ditch Sleep specialist, American Board of Internal Medicine  ELECTRONICALLY SIGNED ON:  08/06/2018, 8:51 PM [place] PH: [phone]   FX: [phone] [place]

## 2018-08-08 ENCOUNTER — Ambulatory Visit: Payer: Medicare HMO

## 2018-08-18 DIAGNOSIS — G4733 Obstructive sleep apnea (adult) (pediatric): Secondary | ICD-10-CM | POA: Diagnosis not present

## 2018-08-21 DIAGNOSIS — G4733 Obstructive sleep apnea (adult) (pediatric): Secondary | ICD-10-CM | POA: Diagnosis not present

## 2018-08-31 DIAGNOSIS — G4733 Obstructive sleep apnea (adult) (pediatric): Secondary | ICD-10-CM | POA: Diagnosis not present

## 2018-09-20 DIAGNOSIS — G4733 Obstructive sleep apnea (adult) (pediatric): Secondary | ICD-10-CM | POA: Diagnosis not present

## 2018-10-21 DIAGNOSIS — G4733 Obstructive sleep apnea (adult) (pediatric): Secondary | ICD-10-CM | POA: Diagnosis not present

## 2018-11-20 DIAGNOSIS — G4733 Obstructive sleep apnea (adult) (pediatric): Secondary | ICD-10-CM | POA: Diagnosis not present

## 2018-11-30 DIAGNOSIS — G4733 Obstructive sleep apnea (adult) (pediatric): Secondary | ICD-10-CM | POA: Diagnosis not present

## 2018-12-21 DIAGNOSIS — G4733 Obstructive sleep apnea (adult) (pediatric): Secondary | ICD-10-CM | POA: Diagnosis not present

## 2018-12-23 ENCOUNTER — Telehealth: Payer: Self-pay | Admitting: Cardiology

## 2018-12-23 NOTE — Telephone Encounter (Signed)
New Message   Patient c/o Palpitations:  High priority if patient c/o lightheadedness, shortness of breath, or chest pain  1) How long have you had palpitations/irregular HR/ Afib? Are you having the symptoms now? palpitations  2) Are you currently experiencing lightheadedness, SOB or CP?  No symptoms  3) Do you have a history of afib (atrial fibrillation) or irregular heart rhythm? yes  4) Have you checked your BP or HR? (document readings if available): HR 73  5) Are you experiencing any other symptoms? no

## 2018-12-23 NOTE — Telephone Encounter (Signed)
Call returned to Pt.  Pt having some increased in palpitations this morning and wanted to make sure they weren't dangerous.  Discussed benign nature of Pt's palpitations.    At last OV with Dr. Marlou Porch there was a conversation of possibly trying low dose Toprol XL to treat.  Rediscussed with Pt who remembers discussion and would be interested in trying this medicaiton.  Advised I would forward to Dr. Marlou Porch.  Advised Pt he would probably not get an answer before beginning of next week.  Pt indicates understanding.  Also advised Pt to increase his fluid iintake d/t appears he has had palpitations triggered by dehydration previously.  Will forward to Dr. Marlou Porch for review.

## 2018-12-26 NOTE — Telephone Encounter (Signed)
OK to try Toprol xl 25mg  PO QD to see if this helps with palpitations.  Candee Furbish, MD

## 2019-01-02 DIAGNOSIS — I129 Hypertensive chronic kidney disease with stage 1 through stage 4 chronic kidney disease, or unspecified chronic kidney disease: Secondary | ICD-10-CM | POA: Diagnosis not present

## 2019-01-02 DIAGNOSIS — D631 Anemia in chronic kidney disease: Secondary | ICD-10-CM | POA: Diagnosis not present

## 2019-01-02 DIAGNOSIS — N183 Chronic kidney disease, stage 3 (moderate): Secondary | ICD-10-CM | POA: Diagnosis not present

## 2019-01-02 DIAGNOSIS — N189 Chronic kidney disease, unspecified: Secondary | ICD-10-CM | POA: Diagnosis not present

## 2019-01-02 NOTE — Telephone Encounter (Signed)
Called to follow up with patient who reports he has palps for 2 days but they have since stopped.  He would like to hold off on start the Toprol at this time and will c/b should he start having palps again.

## 2019-01-09 DIAGNOSIS — M1711 Unilateral primary osteoarthritis, right knee: Secondary | ICD-10-CM | POA: Diagnosis not present

## 2019-01-09 DIAGNOSIS — M25561 Pain in right knee: Secondary | ICD-10-CM | POA: Diagnosis not present

## 2019-01-16 DIAGNOSIS — M25561 Pain in right knee: Secondary | ICD-10-CM | POA: Diagnosis not present

## 2019-01-21 DIAGNOSIS — G4733 Obstructive sleep apnea (adult) (pediatric): Secondary | ICD-10-CM | POA: Diagnosis not present

## 2019-01-25 DIAGNOSIS — M1711 Unilateral primary osteoarthritis, right knee: Secondary | ICD-10-CM | POA: Diagnosis not present

## 2019-01-25 DIAGNOSIS — M25561 Pain in right knee: Secondary | ICD-10-CM | POA: Diagnosis not present

## 2019-01-31 ENCOUNTER — Telehealth: Payer: Self-pay | Admitting: *Deleted

## 2019-01-31 NOTE — Telephone Encounter (Signed)
Per Richardson Dopp, PA pt needs appt for surgery clearance. Pt is agreeable to appt and has been scheduled to see Dr. Marlou Porch in office on 02/14/19. Pt thanked me for the call. I will forward surgery information to Dr. Marlou Porch for appt.

## 2019-01-31 NOTE — Telephone Encounter (Signed)
   [facility] Pre-operative Risk Assessment    Request for surgical clearance:  1. What type of surgery is being performed? RIGHT PARTIAL KNEE ARTHROPLASTY   2. When is this surgery scheduled? 03/02/19   3. What type of clearance is required (medical clearance vs. Pharmacy clearance to hold med vs. Both)? MEDICAL  4. Are there any medications that need to be held prior to surgery and how long? ASA    5. Practice name and name of physician performing surgery? [facility]; DR. Trego   6. What is your office phone number [phone]    7.   What is your office fax number [phone]  8.   Anesthesia type (None, local, MAC, general) ? SPINAL   Julaine Hua 01/31/2019, 12:23 PM  _________________________________________________________________   (provider comments below)

## 2019-01-31 NOTE — Telephone Encounter (Signed)
   Primary Cardiologist:Mark Marlou Porch, MD  Chart reviewed as part of pre-operative protocol coverage. Because of Bruce [name]'s past medical history and time since last visit (> 6 mos), he will require a follow-up visit in order to better assess preoperative cardiovascular risk.  Pre-op covering staff: - Please schedule appointment with Dr. Marlou Porch or APP on his team  and call patient to inform him. - Please contact requesting surgeon's office via preferred method (i.e, phone, fax) to inform them of need for appointment prior to surgery. - This note will also be forwarded to Dr. Marlou Porch to address holding ASA for his procedure so this information will be available at the time of the upcoming appt.  Richardson Dopp, PA-C  01/31/2019, 12:34 PM

## 2019-02-01 NOTE — Telephone Encounter (Signed)
   Patient will see Dr. Marlou Porch 02/14/2019 for surgical clearance. This note will be removed from Preop APP Pool. Richardson Dopp, PA-C    02/01/2019 8:43 AM

## 2019-02-01 NOTE — Telephone Encounter (Signed)
Thanks Mark!

## 2019-02-09 DIAGNOSIS — R7303 Prediabetes: Secondary | ICD-10-CM | POA: Diagnosis not present

## 2019-02-09 DIAGNOSIS — G4733 Obstructive sleep apnea (adult) (pediatric): Secondary | ICD-10-CM | POA: Diagnosis not present

## 2019-02-09 DIAGNOSIS — E7800 Pure hypercholesterolemia, unspecified: Secondary | ICD-10-CM | POA: Diagnosis not present

## 2019-02-09 DIAGNOSIS — I77810 Thoracic aortic ectasia: Secondary | ICD-10-CM | POA: Diagnosis not present

## 2019-02-09 DIAGNOSIS — N400 Enlarged prostate without lower urinary tract symptoms: Secondary | ICD-10-CM | POA: Diagnosis not present

## 2019-02-09 DIAGNOSIS — I25119 Atherosclerotic heart disease of native coronary artery with unspecified angina pectoris: Secondary | ICD-10-CM | POA: Diagnosis not present

## 2019-02-09 DIAGNOSIS — N189 Chronic kidney disease, unspecified: Secondary | ICD-10-CM | POA: Diagnosis not present

## 2019-02-09 DIAGNOSIS — Z87442 Personal history of urinary calculi: Secondary | ICD-10-CM | POA: Diagnosis not present

## 2019-02-09 DIAGNOSIS — Z01812 Encounter for preprocedural laboratory examination: Secondary | ICD-10-CM | POA: Diagnosis not present

## 2019-02-09 DIAGNOSIS — Z0000 Encounter for general adult medical examination without abnormal findings: Secondary | ICD-10-CM | POA: Diagnosis not present

## 2019-02-09 DIAGNOSIS — I10 Essential (primary) hypertension: Secondary | ICD-10-CM | POA: Diagnosis not present

## 2019-02-10 DIAGNOSIS — I10 Essential (primary) hypertension: Secondary | ICD-10-CM | POA: Diagnosis not present

## 2019-02-10 DIAGNOSIS — Z01812 Encounter for preprocedural laboratory examination: Secondary | ICD-10-CM | POA: Diagnosis not present

## 2019-02-10 DIAGNOSIS — Z23 Encounter for immunization: Secondary | ICD-10-CM | POA: Diagnosis not present

## 2019-02-10 DIAGNOSIS — M25561 Pain in right knee: Secondary | ICD-10-CM | POA: Diagnosis not present

## 2019-02-14 ENCOUNTER — Other Ambulatory Visit: Payer: Self-pay

## 2019-02-14 ENCOUNTER — Encounter: Payer: Self-pay | Admitting: Cardiology

## 2019-02-14 ENCOUNTER — Ambulatory Visit: Payer: Medicare HMO | Admitting: Cardiology

## 2019-02-14 VITALS — BP 122/82 | HR 75 | Ht 65.0 in | Wt 174.4 lb

## 2019-02-14 DIAGNOSIS — I10 Essential (primary) hypertension: Secondary | ICD-10-CM | POA: Diagnosis not present

## 2019-02-14 DIAGNOSIS — I2510 Atherosclerotic heart disease of native coronary artery without angina pectoris: Secondary | ICD-10-CM

## 2019-02-14 DIAGNOSIS — Z01810 Encounter for preprocedural cardiovascular examination: Secondary | ICD-10-CM | POA: Diagnosis not present

## 2019-02-14 DIAGNOSIS — R002 Palpitations: Secondary | ICD-10-CM

## 2019-02-14 NOTE — Progress Notes (Signed)
Cardiology Office Note:    Date:  02/14/2019   ID:  Bruce [name], DOB August 15, 1935, MRN [id number]  PCP:  Hulan Fess, MD  Cardiologist:  Candee Furbish, MD  Electrophysiologist:  None   Referring MD: Hulan Fess, MD     History of Present Illness:    Bruce [name] is a 83 y.o. male with coronary artery disease here for follow-up of dizziness after hospitalization at [facility] suggesting he see his cardiologist.  Had a stent placed in 2002 and in 2013 patent stent during catheterization was noted with a EF of 50%.  In 2017 had an echocardiogram and a stress test that showed no ischemia with a small inferior infarct pattern.  In June he was at [facility] with palpitations and lightheadedness.  [place] the mall and felt his heart irregular, palpitations and lightheadedness for several minutes, no chest pain.  He did admit to drinking 3 cups of coffee that morning.  No decongestants such as Sudafed.  His chest x-ray done in the ER which showed mild cardiomegaly but no acute process and he was admitted overnight.  EKG showed nonspecific T wave changes.  Lab work creatinine 1.4 which was stable.  IV fluids were given for hydration.  It was felt ultimately that his symptoms were from hydration.  BUN was slightly elevated at 22.  A nuclear stress test was performed with no ischemia, normal EF of 55%.  Once again this was in June 2019.  After this he developed a cough wheezing never smoked had some results with Z-Pak in the past.  Blood pressure was great.  He requested a nephrology referral from Dr.    He was also referred for sleep study with Dr. Maxwell Caul.  Hemoglobin 14.4, creatinine 1.37 stable at last check.  An EKG performed at Dr. Eddie Dibbles office on 07/25/2015 personally reviewed shows sinus tachycardia rate 102 with nonspecific ST-T wave changes.  EKG prior to this showed sinus bradycardia rate 57.  Feels swimmy headed even with 140/90.  Fairly constant.  No  fevers chills nausea vomiting syncope.  No chest pain.  07/01/2018- here for the follow-up of coronary artery disease palpitations dizziness and hyperlipidemia.  Prior RCA stent 2002.  Stress test June 2019 low risk.  Aortic root 37. Dr. Rex Kras BP higher in R>L   LDL 93 triglycerides 82 hemoglobin 14.6 creatinine 1.2   02/14/2019-here for the evaluation of cardiac risk prior to surgery.  Last nuclear stress test in 2019 was reassuring.  No ischemia.  Palpitations were noted and Toprol-XL was started on 12/23/2018 via telephone encounter but he did not start this medication, did not wish to take another pill.  Denies any anginal symptoms.  Able to achieve 4 METS of activity. Few palps but ok now.  No high risk symptoms such as chest pain fevers chills nausea vomiting syncope bleeding.  Past Medical History:  Diagnosis Date  . Arthritis   . Coronary artery disease    ETT-Myoview EF 51%, inf infarct, no ischemia, low risk  . History of echocardiogram    Echo 3/17: EF 55-60%, inferior HK, grade 1 diastolic dysfunctione  . History of kidney stones   . Hypertension   . Myocardial infarction [facility]) 2000   with stent    Past Surgical History:  Procedure Laterality Date  . CORONARY ANGIOPLASTY    . EYE SURGERY     bilateral cataract surgery with lens implant  . KNEE SURGERY    .  LEFT HEART CATHETERIZATION WITH CORONARY ANGIOGRAM N/A 07/01/2011   Procedure: LEFT HEART CATHETERIZATION WITH CORONARY ANGIOGRAM;  Surgeon: Candee Furbish, MD;  Location: [facility] CATH LAB;  Service: Cardiovascular;  Laterality: N/A;  . left wrist surgery     left wrist fracture  . PARTIAL KNEE ARTHROPLASTY Left 08/02/2017   Procedure: LEFT UNICOMPARTMENTAL KNEE;  Surgeon: Paralee Cancel, MD;  Location: [facility] ORS;  Service: Orthopedics;  Laterality: Left;    Current Medications: Current Meds  Medication Sig  . atorvastatin (LIPITOR) 80 MG tablet Take 80 mg by mouth daily.    . beta carotene w/minerals (OCUVITE) tablet Take 1  tablet by mouth daily.  Marland Kitchen doxazosin (CARDURA) 8 MG tablet Take 8 mg by mouth at bedtime.    . Multiple Vitamin (MULTIVITAMIN WITH MINERALS) TABS tablet Take 1 tablet by mouth daily.  Marland Kitchen omeprazole (PRILOSEC) 40 MG capsule Take 40 mg by mouth daily.  . quinapril (ACCUPRIL) 40 MG tablet Take 1 tablet (40 mg total) by mouth at bedtime.     Allergies:   Aldomet [methyldopa], Plavix [clopidogrel bisulfate], and Aldactone [spironolactone]   Social History   Socioeconomic History  . Marital status: Married    Spouse name: Not on file  . Number of children: Not on file  . Years of education: Not on file  . Highest education level: Not on file  Occupational History  . Not on file  Social Needs  . Financial resource strain: Not on file  . Food insecurity    Worry: Not on file    Inability: Not on file  . Transportation needs    Medical: Not on file    Non-medical: Not on file  Tobacco Use  . Smoking status: Never Smoker  . Smokeless tobacco: Never Used  Substance and Sexual Activity  . Alcohol use: No  . Drug use: No  . Sexual activity: Yes  Lifestyle  . Physical activity    Days per week: Not on file    Minutes per session: Not on file  . Stress: Not on file  Relationships  . Social Herbalist on phone: Not on file    Gets together: Not on file    Attends religious service: Not on file    Active member of club or organization: Not on file    Attends meetings of clubs or organizations: Not on file    Relationship status: Not on file  Other Topics Concern  . Not on file  Social History Narrative  . Not on file     Family History: The patient's family history includes Coronary artery disease in his brother and father; Diabetes in his brother; Heart attack in his sister; Heart disease in his brother; Hyperlipidemia in his sister; Kidney disease in his sister.  ROS:   Please see the history of present illness.     All other systems reviewed and are negative.   EKGs/Labs/Other Studies Reviewed:    The following studies were reviewed today: Extensive review of medical records from [facility] as described in HPI, lab work, stress test, echocardiogram, EKG  Long term monitor 02/2018  Patient had a min HR of 27 bpm (3 beats asymptomatic), max HR of 174 bpm, and avg HR of 71 bpm.  Predominant underlying rhythm was Sinus Rhythm.  First Degree AV Block was present (transient)  2 Ventricular Tachycardia runs occurred, the run with the fastest interval lasting 4 beats with a max rate of 174 bpm, the longest lasting 4  beats with an avg rate of 101 bpm.  8 Supraventricular Tachycardia runs occurred, the run with the fastest interval lasting 6 beats with a max rate of 128 bpm, the longest lasting 5 beats with an avg rate of 99 bpm.  Second Degree AV Block-Mobitz I (Wenckebach) was present. Isolated SVEs were rare (<1.0%), SVE Couplets were rare (<1.0%), and SVE Triplets were rare (<1.0%). Isolated VEs were rare (<1.0%, 13340),  VE Couplets were rare (<1.0%, 40), and VE Triplets were rare (<1.0%, 1). Ventricular Bigeminy and Trigeminy were present.  No atrial fibrillation, no adverse arrhythmias   Overall reassuring monitor with no afib. No sustained VT (normal EF on ECHO), Palpitations are likely secondary rare paroxysmal atrial tachycardia or rare PVC/PAC's.  Could consider low dose diltiazem CD 120mg  PO QD to see if this helps reduce the skipped beats.   EKG: 02/14/2019- 75 bpm old inferior infarct pattern no other abnormalities.  Personally reviewed.  Recent Labs: No results found for requested labs within last 8760 hours.  Recent Lipid Panel    Component Value Date/Time   CHOL  04/30/2009 0535    135        ATP III CLASSIFICATION:  <200     mg/dL   Desirable  200-239  mg/dL   Borderline High  >=240    mg/dL   High          TRIG 70 04/30/2009 0535   HDL 43 04/30/2009 0535   CHOLHDL 3.1 04/30/2009 0535   VLDL 14 04/30/2009 0535    LDLCALC  04/30/2009 0535    78        Total Cholesterol/HDL:CHD Risk Coronary Heart Disease Risk Table                     Men   Women  1/2 Average Risk   3.4   3.3  Average Risk       5.0   4.4  2 X Average Risk   9.6   7.1  3 X Average Risk  23.4   11.0        Use the calculated Patient Ratio above and the CHD Risk Table to determine the patient's CHD Risk.        ATP III CLASSIFICATION (LDL):  <100     mg/dL   Optimal  100-129  mg/dL   Near or Above                    Optimal  130-159  mg/dL   Borderline  160-189  mg/dL   High  >190     mg/dL   Very High    Physical Exam:    VS:  BP 122/82   Pulse 75   Ht 5\' 5"  (1.651 m)   Wt 174 lb 6.4 oz (79.1 kg)   SpO2 93%   BMI 29.02 kg/m     Wt Readings from Last 3 Encounters:  02/14/19 174 lb 6.4 oz (79.1 kg)  07/30/18 175 lb (79.4 kg)  07/01/18 176 lb (79.8 kg)    GEN: Well nourished, well developed, in no acute distress  HEENT: normal  Neck: no JVD, carotid bruits, or masses Cardiac: RRR; no murmurs, rubs, or gallops,no edema  Respiratory:  clear to auscultation bilaterally, normal work of breathing GI: soft, nontender, nondistended, + BS MS: no deformity or atrophy  Skin: warm and dry, no rash Neuro:  Alert and Oriented x 3, Strength and sensation are intact Psych: euthymic  mood, full affect    ASSESSMENT:    1. Coronary artery disease involving native coronary artery of native heart without angina pectoris   2. Essential hypertension   3. Pre-operative cardiovascular examination   4. Palpitations    PLAN:    In order of problems listed above:  Preoperative cardiac risk assessment right knee - Most recent nuclear stress test was reassuring in 2019.  Excellent.  Based upon this and no escalating anginal symptoms, he may proceed with surgery with low to moderate overall cardiac risk based upon his prior CAD history.  Coronary artery disease -RCA stent 2002, widely patent.  Stress test June 2019 in [facility] low risk, reassuring, no ischemia.  Continue with secondary risk factor prevention.  Last aortic root 37 mm on echocardiogram.   Palpitations - PVCs, PAT but no A. fib on monitor.  I started via telephone encounter Toprol-XL 25 mg but he did not wish to start another medication.  This is fine.  Very rare does he have these palpitations.  Dizziness/swimmy headed - Feels better, no significant symptoms currently.  Previously 140/90 and he is still having those symptoms which tell us that this is a noncardiac etiology.  I know this was frustrating for him.  Be very careful when walking.  Could be many different etiologies including inner ear for instance.    Hyperlipidemia - Continue with atorvastatin.  High intensity dose given his coronary disease.  [id number] 12/2018. Good job with atorvastatin and diet.  Essential hypertension - On multidrug regimen.  Upper extremity Doppler on 07/21/2018 was normal.  No evidence of subclavian stenosis.  There was some mild discrepancies between right and left arm pressures.  Doing very well.    Medication Adjustments/Labs and Tests Ordered: Current medicines are reviewed at length with the patient today.  Concerns regarding medicines are outlined above.  Orders Placed This Encounter  Procedures  . EKG 12-Lead   No orders of the defined types were placed in this encounter.   Patient Instructions  Medication Instructions:  The current medical regimen is effective;  continue present plan and medications.  If you need a refill on your cardiac medications before your next appointment, please call your pharmacy.   Follow-Up: At [facility], you and your health needs are our priority.  As part of our continuing mission to provide you with exceptional heart care, we have created designated Provider Care Teams.  These Care Teams include your primary Cardiologist (physician) and Advanced Practice Providers (APPs -  Physician Assistants and Nurse  Practitioners) who all work together to provide you with the care you need, when you need it. You will need a follow up appointment in 12 months.  Please call our office 2 months in advance to schedule this appointment.  You may see Candee Furbish, MD or one of the following Advanced Practice Providers on your designated Care Team:   Truitt Merle, NP Cecilie Kicks, NP . Kathyrn Drown, NP   Thank you for choosing [facility]!!        Signed, Candee Furbish, MD  02/14/2019 8:18 AM    [place]

## 2019-02-14 NOTE — Patient Instructions (Signed)
Medication Instructions:  The current medical regimen is effective;  continue present plan and medications.  If you need a refill on your cardiac medications before your next appointment, please call your pharmacy.   Follow-Up: At [facility], you and your health needs are our priority.  As part of our continuing mission to provide you with exceptional heart care, we have created designated Provider Care Teams.  These Care Teams include your primary Cardiologist (physician) and Advanced Practice Providers (APPs -  Physician Assistants and Nurse Practitioners) who all work together to provide you with the care you need, when you need it. You will need a follow up appointment in 12 months.  Please call our office 2 months in advance to schedule this appointment.  You may see Mark Skains, MD or one of the following Advanced Practice Providers on your designated Care Team:   Lori Gerhardt, NP Laura Ingold, NP . Jill McDaniel, NP  Thank you for choosing [facility]!!      

## 2019-02-17 NOTE — H&P (Signed)
UNICOMPARTMENTAL KNEE ADMISSION H&P  Patient is being admitted for right medial unicompartmental knee arthroplasty.  Subjective:  Chief Complaint:right knee pain.  HPI: Bruce [name], 83 y.o. male, has a history of pain and functional disability in the right knee due to arthritis and has failed non-surgical conservative treatments for greater than 12 weeks to includeactivity modification.  Onset of symptoms was gradual, starting 1 years ago with gradually worsening course since that time. The patient noted prior procedures on the knee to include  arthroscopy and menisectomy on the right knee(s).  Patient currently rates pain in the right knee(s) at 7 out of 10 with activity. Patient has worsening of pain with activity and weight bearing and pain that interferes with activities of daily living.  Patient has evidence of evidence of lateral and patellofemoral compartment joint space loss. by imaging studies. There is no active infection.  Patient Active Problem List   Diagnosis Date Noted   S/P left unicompartmental knee replacement 08/02/2017   Dilated aortic root ([place]) 01/11/2014   Essential hypertension, benign 01/11/2014   Vertigo 01/11/2014   ASCVD (arteriosclerotic cardiovascular disease) 07/15/2013   Essential hypertension 07/15/2013   Erectile dysfunction 07/15/2013   Dyslipidemia 07/15/2013   Peptic ulcer disease 07/15/2013   Past Medical History:  Diagnosis Date   Arthritis    Coronary artery disease    ETT-Myoview EF 51%, inf infarct, no ischemia, low risk   History of echocardiogram    Echo 3/17: EF 55-60%, inferior HK, grade 1 diastolic dysfunctione   History of kidney stones    Hypertension    Myocardial infarction ([place]) 2000   with stent    Past Surgical History:  Procedure Laterality Date   CORONARY ANGIOPLASTY     EYE SURGERY     bilateral cataract surgery with lens implant   KNEE SURGERY     LEFT HEART CATHETERIZATION WITH CORONARY ANGIOGRAM  N/A 07/01/2011   Procedure: LEFT HEART CATHETERIZATION WITH CORONARY ANGIOGRAM;  Surgeon: Candee Furbish, MD;  Location: [facility] CATH LAB;  Service: Cardiovascular;  Laterality: N/A;   left wrist surgery     left wrist fracture   PARTIAL KNEE ARTHROPLASTY Left 08/02/2017   Procedure: LEFT UNICOMPARTMENTAL KNEE;  Surgeon: Paralee Cancel, MD;  Location: [facility] ORS;  Service: Orthopedics;  Laterality: Left;    No current facility-administered medications for this encounter.    Current Outpatient Medications  Medication Sig Dispense Refill Last Dose   aspirin EC 81 MG tablet Take 81 mg by mouth daily.      atorvastatin (LIPITOR) 80 MG tablet Take 80 mg by mouth daily.        beta carotene w/minerals (OCUVITE) tablet Take 1 tablet by mouth daily.      doxazosin (CARDURA) 8 MG tablet Take 8 mg by mouth at bedtime.        Multiple Vitamin (MULTIVITAMIN WITH MINERALS) TABS tablet Take 1 tablet by mouth daily.      omeprazole (PRILOSEC) 40 MG capsule Take 40 mg by mouth daily.      quinapril (ACCUPRIL) 40 MG tablet Take 1 tablet (40 mg total) by mouth at bedtime. 90 tablet 3    Allergies  Allergen Reactions   Aldactone [Spironolactone] Rash   Aldomet [Methyldopa] Itching and Rash   Plavix [Clopidogrel Bisulfate] Itching and Rash    Social History   Tobacco Use   Smoking status: Never Smoker   Smokeless tobacco: Never Used  Substance Use Topics   Alcohol use: No    Family  History  Problem Relation Age of Onset   Coronary artery disease Father    Kidney disease Sister    Hyperlipidemia Sister    Heart disease Brother    Diabetes Brother    Coronary artery disease Brother    Heart attack Sister      Review of Systems  Constitutional: Negative for chills and fever.  Respiratory: Negative for cough.   Cardiovascular: Negative for chest pain and palpitations.  Gastrointestinal: Negative for nausea and vomiting.  Musculoskeletal: Positive for joint pain.     Objective:  Physical Exam Patient is an 83 year old male.  Well nourished and well developed. General: Alert and oriented x3, cooperative and pleasant, no acute distress. Head: normocephalic, atraumatic, neck supple. Eyes: EOMI. Respiratory: breath sounds clear in all fields, no wheezing, rales, or rhonchi. Cardiovascular: Regular rate and rhythm, no murmurs, gallops or rubs. Abdomen: non-tender to palpation and soft, normoactive bowel sounds.  Musculoskeletal: Right knee exam: Healed portal sites barely visible Varus knee Tenderness along the medial aspect the joint. No significant flexion contracture Flexion to 120+ with some discomfort Stable medial and lateral collateral ligaments.  Calves soft and nontender. Motor function intact in LE. Strength 5/5 LE bilaterally. Neuro: Distal pulses 2+. Sensation to light touch intact in LE.   Vital signs in last 24 hours:   Labs:   Estimated body mass index is 29.02 kg/m as calculated from the following:   Height as of 02/14/19: 5\' 5"  (1.651 m).   Weight as of 02/14/19: 79.1 kg.   Imaging Review Right knee MRI demonstrate severe degenerative joint disease of the right knee(s). The overall alignment isneutral. The bone quality appears to be adequate for age and reported activity level.  Assessment/Plan:  End stage arthritis, right knee   The patient history, physical examination, clinical judgment of the provider and imaging studies are consistent with end stage degenerative joint disease of the right knee(s) and unicompartmental knee arthroplasty is deemed medically necessary. The treatment options including medical management, injection therapy arthroscopy and arthroplasty were discussed at length. The risks and benefits of total knee arthroplasty were presented and reviewed. The risks due to aseptic loosening, infection, stiffness, patella tracking problems, thromboembolic complications and other imponderables were  discussed. The patient acknowledged the explanation, agreed to proceed with the plan and consent was signed. Patient is being admitted for inpatient treatment for surgery, pain control, PT, OT, prophylactic antibiotics, VTE prophylaxis, progressive ambulation and ADL's and discharge planning. The patient is planning to be discharged home.  Therapy Plans: outpatient therapy at [facility] Disposition: Home with wife Planned DVT Prophylaxis: aspirin 81mg  BID DME needed: none PCP: Dr. Hulan Fess Cardiologist: Dr. Candee Furbish TXA: IV Allergies: Aldactone, aldomet, plavix, methyldopa - itching, NSAIDs - cannot take due to CKD Anesthesia Concerns: none BMI: 29.1 Last HgbA1c: 6.0%  Other: Hx of MI, hx of cardiac stent placement 2002. Hx of atrial flutter. Hx of CKD, no celebrex. Norco okay.   Patient's anticipated LOS is less than 2 midnights, meeting these requirements: - Lives within 1 hour of care - Has a competent adult at home to recover with post-op recover - NO history of  - Chronic pain requiring opiods  - Diabetes  - Coronary Artery Disease  - Heart failure  - Stroke  - DVT/VTE  - Respiratory Failure/COPD  - Renal failure  - Anemia  - Advanced Liver disease  - Patient was instructed on what medications to stop prior to surgery. - Follow-up visit in 2 weeks  with Dr. Alvan Dame - Begin physical therapy following surgery - Pre-operative lab work as pre-surgical testing - Prescriptions will be provided in hospital at time of discharge  Griffith Citron, PA-C Orthopedic Surgery [facility] [place] [phone]

## 2019-02-20 DIAGNOSIS — G4733 Obstructive sleep apnea (adult) (pediatric): Secondary | ICD-10-CM | POA: Diagnosis not present

## 2019-02-20 NOTE — Patient Instructions (Addendum)
DUE TO COVID-19 ONLY ONE VISITOR IS ALLOWED TO COME WITH YOU AND STAY IN THE WAITING ROOM ONLY DURING PRE OP AND PROCEDURE DAY OF SURGERY. THE 1 VISITOR MAY VISIT WITH YOU AFTER SURGERY IN YOUR PRIVATE ROOM DURING VISITING HOURS ONLY!  YOU NEED TO HAVE A COVID 19 TEST ON__Monday 10-12_____ @____10 :00AM___, THIS TEST MUST BE DONE BEFORE SURGERY, COME  [address], [place]  , [address].  (Bruce [name]) ONCE YOUR COVID TEST IS COMPLETED, PLEASE BEGIN THE QUARANTINE INSTRUCTIONS AS OUTLINED IN YOUR HANDOUT.                Bruce [name]    Your procedure is scheduled on: 03-02-2019   Report to [facility] [name]  Entrance   Report to admitting at 6:10AM     Call this number if you have problems the morning of surgery Bruce [name], NO CHEWING GUM [place].   Do not eat food After Midnight. YOU MAY HAVE CLEAR LIQUIDS FROM MIDNIGHT UNTIL 5:40AM. At 5:40AM Please finish the prescribed Pre-Surgery ENSURE drink. Nothing by mouth after you finish the ENSURE drink !   CLEAR LIQUID DIET   Foods Allowed                                                                     Foods Excluded  Coffee and tea, regular and decaf                             liquids that you cannot  Plain Jell-O any favor except red or purple                                           see through such as: Fruit ices (not with fruit pulp)                                     milk, soups, orange juice  Iced Popsicles                                    All solid food Carbonated beverages, regular and diet                                    Cranberry, grape and apple juices Sports drinks like Gatorade Lightly seasoned clear broth or consume(fat free) Sugar, honey syrup  Sample Menu Breakfast                                Lunch  Supper Cranberry juice                    Beef broth                             Chicken broth Jell-O                                     Grape juice                           Apple juice Coffee or tea                        Jell-O                                      Popsicle                                                Coffee or tea                        Coffee or tea  _____________________________________________________________________     Take these medicines the morning of surgery with A SIP OF WATER: ATORVASTATIN , OMEPRAZOLE                                  You may not have any metal on your body including hair pins and              piercings  Do not wear jewelry, make-up, lotions, powders or perfumes, deodorant                       Men may shave face and neck.   Do not bring valuables to the hospital. [place].  Contacts, dentures or bridgework may not be worn into surgery.  YOU MAY BRING A SMALL OVERNIGHT BAG              Please read over the following fact sheets you were given: _____________________________________________________________________             [facility] - Preparing for Surgery Before surgery, you can play an important role.  Because skin is not sterile, your skin needs to be as free of germs as possible.  You can reduce the number of germs on your skin by washing with CHG (chlorahexidine gluconate) soap before surgery.  CHG is an antiseptic cleaner which kills germs and bonds with the skin to continue killing germs even after washing. Please DO NOT use if you have an allergy to CHG or antibacterial soaps.  If your skin becomes reddened/irritated stop using the CHG and inform your nurse when you arrive at Short Stay. Do not shave (including legs and underarms) for at least 48 hours prior to the first CHG shower.  You may shave your face/neck. Please follow these  instructions carefully:  1.  Shower with CHG Soap the night before surgery and the  morning of Surgery.  2.   If you choose to wash your hair, wash your hair first as usual with your  normal  shampoo.  3.  After you shampoo, rinse your hair and body thoroughly to remove the  shampoo.                           4.  Use CHG as you would any other liquid soap.  You can apply chg directly  to the skin and wash                       Gently with a scrungie or clean washcloth.  5.  Apply the CHG Soap to your body ONLY FROM THE NECK DOWN.   Do not use on face/ open                           Wound or open sores. Avoid contact with eyes, ears mouth and genitals (private parts).                       Wash face,  Genitals (private parts) with your normal soap.             6.  Wash thoroughly, paying special attention to the area where your surgery  will be performed.  7.  Thoroughly rinse your body with warm water from the neck down.  8.  DO NOT shower/wash with your normal soap after using and rinsing off  the CHG Soap.                9.  Pat yourself dry with a clean towel.            10.  Wear clean pajamas.            11.  Place clean sheets on your bed the night of your first shower and do not  sleep with pets. Day of Surgery : Do not apply any lotions/deodorants the morning of surgery.  Please wear clean clothes to the hospital/surgery center.  FAILURE TO FOLLOW THESE INSTRUCTIONS MAY RESULT IN THE CANCELLATION OF YOUR SURGERY PATIENT SIGNATURE_________________________________  NURSE SIGNATURE__________________________________  ________________________________________________________________________   Bruce [name]  An incentive spirometer is a tool that can help keep your lungs clear and active. This tool measures how well you are filling your lungs with each breath. Taking long deep breaths may help reverse or decrease the chance of developing breathing (pulmonary) problems (especially infection) following:  A long period of time when you are unable to move or be active. BEFORE THE PROCEDURE    If the spirometer includes an indicator to show your best effort, your nurse or respiratory therapist will set it to a desired goal.  If possible, sit up straight or lean slightly forward. Try not to slouch.  Hold the incentive spirometer in an upright position. INSTRUCTIONS FOR USE  1. Sit on the edge of your bed if possible, or sit up as far as you can in bed or on a chair. 2. Hold the incentive spirometer in an upright position. 3. Breathe out normally. 4. Place the mouthpiece in your mouth and seal your lips tightly around it. 5. Breathe in slowly and as deeply as possible, raising the piston or the [place] toward the  top of the column. 6. Hold your breath for 3-5 seconds or for as long as possible. Allow the piston or [place] to fall to the bottom of the column. 7. Remove the mouthpiece from your mouth and breathe out normally. 8. Rest for a few seconds and repeat Steps 1 through 7 at least 10 times every 1-2 hours when you are awake. Take your time and take a few normal breaths between deep breaths. 9. The spirometer may include an indicator to show your best effort. Use the indicator as a goal to work toward during each repetition. 10. After each set of 10 deep breaths, practice coughing to be sure your lungs are clear. If you have an incision (the cut made at the time of surgery), support your incision when coughing by placing a pillow or rolled up towels firmly against it. Once you are able to get out of bed, walk around indoors and cough well. You may stop using the incentive spirometer when instructed by your caregiver.  RISKS AND COMPLICATIONS  Take your time so you do not get dizzy or light-headed.  If you are in pain, you may need to take or ask for pain medication before doing incentive spirometry. It is harder to take a deep breath if you are having pain. AFTER USE  Rest and breathe slowly and easily.  It can be helpful to keep track of a log of your progress. Your caregiver  can provide you with a simple table to help with this. If you are using the spirometer at home, follow these instructions: [place] IF:   You are having difficultly using the spirometer.  You have trouble using the spirometer as often as instructed.  Your pain medication is not giving enough relief while using the spirometer.  You develop fever of 100.5 F (38.1 C) or higher. SEEK IMMEDIATE MEDICAL CARE IF:   You cough up bloody sputum that had not been present before.  You develop fever of 102 F (38.9 C) or greater.  You develop worsening pain at or near the incision site. MAKE SURE YOU:   Understand these instructions.  Will watch your condition.  Will get help right away if you are not doing well or get worse. Document Released: 09/14/2006 Document Revised: 07/27/2011 Document Reviewed: 11/15/2006 Exit[employer] Patient Information 2014 ExitCare, [place].   ________________________________________________________________________  WHAT IS A BLOOD TRANSFUSION? Blood Transfusion Information  A transfusion is the replacement of blood or some of its parts. Blood is made up of multiple cells which provide different functions.  Red blood cells carry oxygen and are used for blood loss replacement.  White blood cells fight against infection.  Platelets control bleeding.  Plasma helps clot blood.  Other blood products are available for specialized needs, such as hemophilia or other clotting disorders. BEFORE THE TRANSFUSION  Who gives blood for transfusions?   Healthy volunteers who are fully evaluated to make sure their blood is safe. This is blood bank blood. Transfusion therapy is the safest it has ever been in the practice of medicine. Before blood is taken from a donor, a complete history is taken to make sure that person has no history of diseases nor engages in risky social behavior (examples are intravenous drug use or sexual activity with multiple partners). The  donor's travel history is screened to minimize risk of transmitting infections, such as malaria. The donated blood is tested for signs of infectious diseases, such as HIV and hepatitis. The blood is then tested  to be sure it is compatible with you in order to minimize the chance of a transfusion reaction. If you or a relative donates blood, this is often done in anticipation of surgery and is not appropriate for emergency situations. It takes many days to process the donated blood. RISKS AND COMPLICATIONS Although transfusion therapy is very safe and saves many lives, the main dangers of transfusion include:   Getting an infectious disease.  Developing a transfusion reaction. This is an allergic reaction to something in the blood you were given. Every precaution is taken to prevent this. The decision to have a blood transfusion has been considered carefully by your caregiver before blood is given. Blood is not given unless the benefits outweigh the risks. AFTER THE TRANSFUSION  Right after receiving a blood transfusion, you will usually feel much better and more energetic. This is especially true if your red blood cells have gotten low (anemic). The transfusion raises the level of the red blood cells which carry oxygen, and this usually causes an energy increase.  The nurse administering the transfusion will monitor you carefully for complications. HOME CARE INSTRUCTIONS  No special instructions are needed after a transfusion. You may find your energy is better. Speak with your caregiver about any limitations on activity for underlying diseases you may have. SEEK MEDICAL CARE IF:   Your condition is not improving after your transfusion.  You develop redness or irritation at the intravenous (IV) site. SEEK IMMEDIATE MEDICAL CARE IF:  Any of the following symptoms occur over the next 12 hours:  Shaking chills.  You have a temperature by mouth above 102 F (38.9 C), not controlled by  medicine.  Chest, back, or muscle pain.  People around you feel you are not acting correctly or are confused.  Shortness of breath or difficulty breathing.  Dizziness and fainting.  You get a rash or develop hives.  You have a decrease in urine output.  Your urine turns a dark color or changes to pink, red, or brown. Any of the following symptoms occur over the next 10 days:  You have a temperature by mouth above 102 F (38.9 C), not controlled by medicine.  Shortness of breath.  Weakness after normal activity.  The white part of the eye turns yellow (jaundice).  You have a decrease in the amount of urine or are urinating less often.  Your urine turns a dark color or changes to pink, red, or brown. Document Released: 05/01/2000 Document Revised: 07/27/2011 Document Reviewed: 12/19/2007 [facility] Patient Information 2014 [place], [place].  _______________________________________________________________________

## 2019-02-21 ENCOUNTER — Encounter (HOSPITAL_COMMUNITY): Payer: Self-pay

## 2019-02-21 ENCOUNTER — Encounter (HOSPITAL_COMMUNITY)
Admission: RE | Admit: 2019-02-21 | Discharge: 2019-02-21 | Disposition: A | Discharge status: Home / Self Care | Payer: Medicare HMO | Source: Ambulatory Visit | Attending: Orthopedic Surgery | Admitting: Orthopedic Surgery

## 2019-02-21 ENCOUNTER — Other Ambulatory Visit: Payer: Self-pay

## 2019-02-21 DIAGNOSIS — Z96652 Presence of left artificial knee joint: Secondary | ICD-10-CM | POA: Diagnosis not present

## 2019-02-21 DIAGNOSIS — M1711 Unilateral primary osteoarthritis, right knee: Secondary | ICD-10-CM | POA: Diagnosis not present

## 2019-02-21 DIAGNOSIS — M1990 Unspecified osteoarthritis, unspecified site: Secondary | ICD-10-CM | POA: Diagnosis not present

## 2019-02-21 DIAGNOSIS — I2510 Atherosclerotic heart disease of native coronary artery without angina pectoris: Secondary | ICD-10-CM | POA: Insufficient documentation

## 2019-02-21 DIAGNOSIS — Z01812 Encounter for preprocedural laboratory examination: Secondary | ICD-10-CM | POA: Insufficient documentation

## 2019-02-21 DIAGNOSIS — Z7982 Long term (current) use of aspirin: Secondary | ICD-10-CM | POA: Insufficient documentation

## 2019-02-21 DIAGNOSIS — G4730 Sleep apnea, unspecified: Secondary | ICD-10-CM | POA: Diagnosis not present

## 2019-02-21 DIAGNOSIS — Z79899 Other long term (current) drug therapy: Secondary | ICD-10-CM | POA: Insufficient documentation

## 2019-02-21 DIAGNOSIS — I10 Essential (primary) hypertension: Secondary | ICD-10-CM | POA: Insufficient documentation

## 2019-02-21 DIAGNOSIS — I252 Old myocardial infarction: Secondary | ICD-10-CM | POA: Diagnosis not present

## 2019-02-21 HISTORY — DX: Unspecified macular degeneration: H35.30

## 2019-02-21 HISTORY — DX: Sleep apnea, unspecified: G47.30

## 2019-02-21 LAB — CBC
HCT: 42.5 % (ref 39.0–52.0)
Hemoglobin: 13.9 g/dL (ref 13.0–17.0)
MCH: 32 pg (ref 26.0–34.0)
MCHC: 32.7 g/dL (ref 30.0–36.0)
MCV: 97.7 fL (ref 80.0–100.0)
Platelets: 145 10*3/uL — ABNORMAL LOW (ref 150–400)
RBC: 4.35 MIL/uL (ref 4.22–5.81)
RDW: 13.2 % (ref 11.5–15.5)
WBC: 4.8 10*3/uL (ref 4.0–10.5)
nRBC: 0 % (ref 0.0–0.2)

## 2019-02-21 LAB — BASIC METABOLIC PANEL
Anion gap: 15 (ref 5–15)
BUN: 16 mg/dL (ref 8–23)
CO2: 19 mmol/L — ABNORMAL LOW (ref 22–32)
Calcium: 9 mg/dL (ref 8.9–10.3)
Chloride: 105 mmol/L (ref 98–111)
Creatinine, Ser: 1.39 mg/dL — ABNORMAL HIGH (ref 0.61–1.24)
GFR calc Af Amer: 55 mL/min — ABNORMAL LOW (ref >60)
GFR calc non Af Amer: 48 mL/min — ABNORMAL LOW (ref >60)
Glucose, Bld: 105 mg/dL — ABNORMAL HIGH (ref 70–99)
Potassium: 4.3 mmol/L (ref 3.5–5.1)
Sodium: 139 mmol/L (ref 135–145)

## 2019-02-21 LAB — SURGICAL PCR SCREEN
MRSA, PCR: NEGATIVE
Staphylococcus aureus: NEGATIVE

## 2019-02-21 NOTE — Progress Notes (Signed)
PCP - Hulan Fess, MD Cardiologist - Jerline Pain, MD  Chest x-ray -  EKG - epic [phone] Stress Test - epic 2019 ECHO - epic 2017 Cardiac Cath -   Sleep Study -  CPAP - uses cpap nightly   Fasting Blood Sugar -  Checks Blood Sugar _____ times a day  Blood Thinner Instructions: Aspirin Instructions: hold x 7 days  Last Dose: 10-7  Anesthesia review:   Hx of MI, cad with stent placed in 2002. Cardiac cath again 2013 , no stent . Cardiologist Dr Candee Furbish clearance 02-14-2019 epic   Patient denies shortness of breath, fever, cough and chest pain at PAT appointment   Patient verbalized understanding of instructions that were given to them at the PAT appointment. Patient was also instructed that they will need to review over the PAT instructions again at home before surgery.

## 2019-02-22 NOTE — Anesthesia Preprocedure Evaluation (Addendum)
Anesthesia Evaluation  Patient identified by MRN, date of birth, ID band Patient awake    Reviewed: Allergy & Precautions, NPO status , Patient's Chart, lab work & pertinent test results  Airway Mallampati: II  TM Distance: >3 FB Neck ROM: Full    Dental no notable dental hx.    Pulmonary sleep apnea ,    Pulmonary exam normal breath sounds clear to auscultation       Cardiovascular hypertension, + CAD, + Past MI and + Cardiac Stents  Normal cardiovascular exam Rhythm:Regular Rate:Normal     Neuro/Psych negative neurological ROS  negative psych ROS   GI/Hepatic negative GI ROS, Neg liver ROS,   Endo/Other  negative endocrine ROS  Renal/GU negative Renal ROS  negative genitourinary   Musculoskeletal  (+) Arthritis , Osteoarthritis,    Abdominal   Peds negative pediatric ROS (+)  Hematology negative hematology ROS (+)   Anesthesia Other Findings   Reproductive/Obstetrics negative OB ROS                            Anesthesia Physical Anesthesia Plan  ASA: III  Anesthesia Plan: General   Post-op Pain Management:  Regional for Post-op pain   Induction: Intravenous  PONV Risk Score and Plan: 1 and Ondansetron and Treatment may vary due to age or medical condition  Airway Management Planned: LMA  Additional Equipment:   Intra-op Plan:   Post-operative Plan:   Informed Consent: I have reviewed the patients History and Physical, chart, labs and discussed the procedure including the risks, benefits and alternatives for the proposed anesthesia with the patient or authorized representative who has indicated his/her understanding and acceptance.     Dental advisory given  Plan Discussed with: CRNA  Anesthesia Plan Comments: (See PAT note 02/21/2019, Konrad Felix, PA-C)     Anesthesia Quick Evaluation

## 2019-02-22 NOTE — Progress Notes (Signed)
Anesthesia Chart Review   Case: [id number] Date/Time: 03/02/19 0825   Procedure: UNICOMPARTMENTAL KNEE Medially (Right Knee) - 90 mins   Anesthesia type: Spinal   Pre-op diagnosis: Right knee medial compartmental osteoarthritis   Location: [facility] ROOM 10 / [facility]   Surgeon: Paralee Cancel, MD      DISCUSSION:83 y.o. never smoker with h/o HTN, sleep apnea w/cpap use, CAD (MI, stent 2002), right knee medial compartmental OA scheduled for above procedure 03/02/2019 with Dr. Paralee Cancel.   Pt last seen by cardiologist, Dr. Candee Furbish, 02/14/2019.  Per OV note, "Most recent nuclear stress test was reassuring in 2019.  Excellent.  Based upon this and no escalating anginal symptoms, he may proceed with surgery with low to moderate overall cardiac risk based upon his prior CAD history."  Anticipate pt can proceed with planned procedure barring acute status change.   VS: BP 140/75   Pulse 75   Temp 36.7 C (Oral)   Resp 16   Ht 5\' 5"  (1.651 m)   Wt 80.6 kg   SpO2 96%   BMI 29.59 kg/m   PROVIDERS: Hulan Fess, MD is PCP   Candee Furbish, MD is Cardiologist  LABS: Labs reviewed: Acceptable for surgery. (all labs ordered are listed, but only abnormal results are displayed)  Labs Reviewed  BASIC METABOLIC PANEL - Abnormal; Notable for the following components:      Result Value   CO2 19 (*)    Glucose, Bld 105 (*)    Creatinine, Ser 1.39 (*)    GFR calc non Af Amer 48 (*)    GFR calc Af Amer 55 (*)    All other components within normal limits  CBC - Abnormal; Notable for the following components:   Platelets 145 (*)    All other components within normal limits  SURGICAL PCR SCREEN  TYPE AND SCREEN     IMAGES:   EKG: 02/14/2019 Rate 75 bpm Normal sinus rhythm  Inferior infarct, age undetermined   CV: Myocardial Perfusion Imaging 11/13/2017 Impression: Normal Adenosine stress test without ischemic symptoms or EKG changes.   Echo 08/12/2015 Study Conclusions  - Left  ventricle: The cavity size was normal. Wall thickness was   normal. Systolic function was normal. The estimated ejection   fraction was in the range of 55% to 60%. There is hypokinesis of   the basalinferior myocardium. Doppler parameters are consistent   with abnormal left ventricular relaxation (grade 1 diastolic   dysfunction).  Impressions:  - Hypokinesis of the basal inferior wall with overall preserved LV   systolic function; grade 1 diastolic dysfunction; trace MR and   TR. Past Medical History:  Diagnosis Date  . Arthritis   . Coronary artery disease    ETT-Myoview EF 51%, inf infarct, no ischemia, low risk  . History of echocardiogram    Echo 3/17: EF 55-60%, inferior HK, grade 1 diastolic dysfunctione  . History of kidney stones   . Hypertension   . Macular degeneration   . Myocardial infarction ([place]) 2000   with stent  . Sleep apnea    cpap in use     Past Surgical History:  Procedure Laterality Date  . CORONARY ANGIOPLASTY    . EYE SURGERY     bilateral cataract surgery with lens implant  . KNEE SURGERY Right   . LEFT HEART CATHETERIZATION WITH CORONARY ANGIOGRAM N/A 07/01/2011   Procedure: LEFT HEART CATHETERIZATION WITH CORONARY ANGIOGRAM;  Surgeon: Candee Furbish, MD;  Location: [facility] CATH LAB;  Service: Cardiovascular;  Laterality: N/A;  . left wrist surgery     left wrist fracture  . PARTIAL KNEE ARTHROPLASTY Left 08/02/2017   Procedure: LEFT UNICOMPARTMENTAL KNEE;  Surgeon: Paralee Cancel, MD;  Location: [facility];  Service: Orthopedics;  Laterality: Left;    MEDICATIONS: . aspirin EC 81 MG tablet  . atorvastatin (LIPITOR) 80 MG tablet  . beta carotene w/minerals (OCUVITE) tablet  . doxazosin (CARDURA) 8 MG tablet  . Multiple Vitamin (MULTIVITAMIN WITH MINERALS) TABS tablet  . omeprazole (PRILOSEC) 40 MG capsule  . quinapril (ACCUPRIL) 40 MG tablet   No current facility-administered medications for this encounter.      Maia Plan [facility]  Pre-Surgical Testing [phone] 02/22/19  3:54 PM

## 2019-02-27 ENCOUNTER — Other Ambulatory Visit (HOSPITAL_COMMUNITY)
Admission: RE | Admit: 2019-02-27 | Discharge: 2019-02-27 | Disposition: A | Discharge status: Home / Self Care | Payer: Medicare HMO | Source: Ambulatory Visit | Attending: Orthopedic Surgery | Admitting: Orthopedic Surgery

## 2019-02-27 DIAGNOSIS — Z20828 Contact with and (suspected) exposure to other viral communicable diseases: Secondary | ICD-10-CM | POA: Insufficient documentation

## 2019-02-27 DIAGNOSIS — Z01812 Encounter for preprocedural laboratory examination: Secondary | ICD-10-CM | POA: Diagnosis not present

## 2019-02-27 LAB — SARS CORONAVIRUS 2 (TAT 6-24 HRS): SARS Coronavirus 2: NEGATIVE

## 2019-03-01 DIAGNOSIS — G4733 Obstructive sleep apnea (adult) (pediatric): Secondary | ICD-10-CM | POA: Diagnosis not present

## 2019-03-02 ENCOUNTER — Ambulatory Visit (HOSPITAL_COMMUNITY): Payer: Medicare HMO | Admitting: Physician Assistant

## 2019-03-02 ENCOUNTER — Other Ambulatory Visit: Payer: Self-pay

## 2019-03-02 ENCOUNTER — Encounter (HOSPITAL_COMMUNITY)
Admission: AD | Disposition: A | Discharge status: Home / Self Care | Payer: Self-pay | Source: Ambulatory Visit | Attending: Orthopedic Surgery

## 2019-03-02 ENCOUNTER — Observation Stay (HOSPITAL_COMMUNITY)
Admission: AD | Admit: 2019-03-02 | Discharge: 2019-03-03 | Disposition: A | Discharge status: Home / Self Care | Payer: Medicare HMO | Source: Ambulatory Visit | Attending: Orthopedic Surgery | Admitting: Orthopedic Surgery

## 2019-03-02 ENCOUNTER — Ambulatory Visit (HOSPITAL_COMMUNITY): Payer: Medicare HMO | Admitting: Registered Nurse

## 2019-03-02 ENCOUNTER — Encounter (HOSPITAL_COMMUNITY): Payer: Self-pay | Admitting: *Deleted

## 2019-03-02 DIAGNOSIS — I252 Old myocardial infarction: Secondary | ICD-10-CM | POA: Insufficient documentation

## 2019-03-02 DIAGNOSIS — Z833 Family history of diabetes mellitus: Secondary | ICD-10-CM | POA: Diagnosis not present

## 2019-03-02 DIAGNOSIS — H3530 Unspecified macular degeneration: Secondary | ICD-10-CM | POA: Insufficient documentation

## 2019-03-02 DIAGNOSIS — N189 Chronic kidney disease, unspecified: Secondary | ICD-10-CM | POA: Diagnosis not present

## 2019-03-02 DIAGNOSIS — Z955 Presence of coronary angioplasty implant and graft: Secondary | ICD-10-CM | POA: Insufficient documentation

## 2019-03-02 DIAGNOSIS — M1711 Unilateral primary osteoarthritis, right knee: Principal | ICD-10-CM | POA: Insufficient documentation

## 2019-03-02 DIAGNOSIS — Z6829 Body mass index (BMI) 29.0-29.9, adult: Secondary | ICD-10-CM | POA: Insufficient documentation

## 2019-03-02 DIAGNOSIS — I2510 Atherosclerotic heart disease of native coronary artery without angina pectoris: Secondary | ICD-10-CM | POA: Insufficient documentation

## 2019-03-02 DIAGNOSIS — Z841 Family history of disorders of kidney and ureter: Secondary | ICD-10-CM | POA: Diagnosis not present

## 2019-03-02 DIAGNOSIS — Z96651 Presence of right artificial knee joint: Secondary | ICD-10-CM

## 2019-03-02 DIAGNOSIS — Z79899 Other long term (current) drug therapy: Secondary | ICD-10-CM | POA: Diagnosis not present

## 2019-03-02 DIAGNOSIS — E663 Overweight: Secondary | ICD-10-CM | POA: Diagnosis present

## 2019-03-02 DIAGNOSIS — Z886 Allergy status to analgesic agent status: Secondary | ICD-10-CM | POA: Diagnosis not present

## 2019-03-02 DIAGNOSIS — Z87442 Personal history of urinary calculi: Secondary | ICD-10-CM | POA: Insufficient documentation

## 2019-03-02 DIAGNOSIS — G4730 Sleep apnea, unspecified: Secondary | ICD-10-CM | POA: Insufficient documentation

## 2019-03-02 DIAGNOSIS — Z8249 Family history of ischemic heart disease and other diseases of the circulatory system: Secondary | ICD-10-CM | POA: Insufficient documentation

## 2019-03-02 DIAGNOSIS — K279 Peptic ulcer, site unspecified, unspecified as acute or chronic, without hemorrhage or perforation: Secondary | ICD-10-CM | POA: Insufficient documentation

## 2019-03-02 DIAGNOSIS — Z888 Allergy status to other drugs, medicaments and biological substances status: Secondary | ICD-10-CM | POA: Diagnosis not present

## 2019-03-02 DIAGNOSIS — I129 Hypertensive chronic kidney disease with stage 1 through stage 4 chronic kidney disease, or unspecified chronic kidney disease: Secondary | ICD-10-CM | POA: Insufficient documentation

## 2019-03-02 DIAGNOSIS — I10 Essential (primary) hypertension: Secondary | ICD-10-CM | POA: Diagnosis not present

## 2019-03-02 DIAGNOSIS — Z96652 Presence of left artificial knee joint: Secondary | ICD-10-CM | POA: Diagnosis not present

## 2019-03-02 DIAGNOSIS — Z7982 Long term (current) use of aspirin: Secondary | ICD-10-CM | POA: Diagnosis not present

## 2019-03-02 DIAGNOSIS — G8918 Other acute postprocedural pain: Secondary | ICD-10-CM | POA: Diagnosis not present

## 2019-03-02 DIAGNOSIS — R42 Dizziness and giddiness: Secondary | ICD-10-CM | POA: Diagnosis not present

## 2019-03-02 HISTORY — PX: PARTIAL KNEE ARTHROPLASTY: SHX2174

## 2019-03-02 HISTORY — DX: Nausea with vomiting, unspecified: Z98.890

## 2019-03-02 HISTORY — DX: Nausea with vomiting, unspecified: R11.2

## 2019-03-02 LAB — TYPE AND SCREEN
ABO/RH(D): A NEG
Antibody Screen: NEGATIVE

## 2019-03-02 SURGERY — ARTHROPLASTY, KNEE, UNICOMPARTMENTAL
Anesthesia: Spinal | Site: Knee | Laterality: Right

## 2019-03-02 MED ORDER — ONDANSETRON HCL 4 MG/2ML IJ SOLN
INTRAMUSCULAR | Status: DC | PRN
  Administered 2019-03-02: 4 mg via INTRAVENOUS

## 2019-03-02 MED ORDER — HYDROMORPHONE HCL 1 MG/ML IJ SOLN
INTRAMUSCULAR | Status: AC
  Filled 2019-03-02: qty 1

## 2019-03-02 MED ORDER — POVIDONE-IODINE 10 % EX SWAB: 2.0000 "application " | Freq: Once | CUTANEOUS | Status: DC

## 2019-03-02 MED ORDER — CEFAZOLIN SODIUM-DEXTROSE 2-4 GM/100ML-% IV SOLN
2.0000 g | INTRAVENOUS | Status: AC
  Administered 2019-03-02: 2 g via INTRAVENOUS
  Filled 2019-03-02: qty 100

## 2019-03-02 MED ORDER — ALUM & MAG HYDROXIDE-SIMETH 200-200-20 MG/5ML PO SUSP: 30.0000 mL | ORAL | Status: DC | PRN

## 2019-03-02 MED ORDER — SODIUM CHLORIDE (PF) 0.9 % IJ SOLN
INTRAMUSCULAR | Status: AC
  Filled 2019-03-02: qty 50

## 2019-03-02 MED ORDER — DEXAMETHASONE SODIUM PHOSPHATE 10 MG/ML IJ SOLN
10.0000 mg | Freq: Once | INTRAMUSCULAR | Status: AC
  Administered 2019-03-02: 10 mg via INTRAVENOUS

## 2019-03-02 MED ORDER — PHENYLEPHRINE HCL-NACL 10-0.9 MG/250ML-% IV SOLN
INTRAVENOUS | Status: AC
  Filled 2019-03-02: qty 250

## 2019-03-02 MED ORDER — ATORVASTATIN CALCIUM 40 MG PO TABS
80.0000 mg | ORAL_TABLET | Freq: Every day | ORAL | Status: DC
  Administered 2019-03-03: 80 mg via ORAL
  Filled 2019-03-02: qty 2

## 2019-03-02 MED ORDER — SODIUM CHLORIDE (PF) 0.9 % IJ SOLN
INTRAMUSCULAR | Status: DC | PRN
  Administered 2019-03-02: 30 mL

## 2019-03-02 MED ORDER — ONDANSETRON HCL 4 MG/2ML IJ SOLN
INTRAMUSCULAR | Status: AC
  Filled 2019-03-02: qty 2

## 2019-03-02 MED ORDER — DOXAZOSIN MESYLATE 8 MG PO TABS
8.0000 mg | ORAL_TABLET | Freq: Every day | ORAL | Status: DC
  Administered 2019-03-02: 8 mg via ORAL
  Filled 2019-03-02: qty 1

## 2019-03-02 MED ORDER — DOCUSATE SODIUM 100 MG PO CAPS
100.0000 mg | ORAL_CAPSULE | Freq: Two times a day (BID) | ORAL | Status: DC
  Administered 2019-03-02: 100 mg via ORAL
  Filled 2019-03-02: qty 1

## 2019-03-02 MED ORDER — PROPOFOL 10 MG/ML IV BOLUS
INTRAVENOUS | Status: AC
  Filled 2019-03-02: qty 20

## 2019-03-02 MED ORDER — LIDOCAINE 2% (20 MG/ML) 5 ML SYRINGE
INTRAMUSCULAR | Status: DC | PRN
  Administered 2019-03-02: 60 mg via INTRAVENOUS

## 2019-03-02 MED ORDER — CEFAZOLIN SODIUM-DEXTROSE 2-4 GM/100ML-% IV SOLN
2.0000 g | Freq: Four times a day (QID) | INTRAVENOUS | Status: AC
  Administered 2019-03-02 (×2): 2 g via INTRAVENOUS
  Filled 2019-03-02 (×2): qty 100

## 2019-03-02 MED ORDER — TRANEXAMIC ACID-NACL 1000-0.7 MG/100ML-% IV SOLN
1000.0000 mg | INTRAVENOUS | Status: AC
  Administered 2019-03-02: 1000 mg via INTRAVENOUS
  Filled 2019-03-02: qty 100

## 2019-03-02 MED ORDER — EPHEDRINE 5 MG/ML INJ
INTRAVENOUS | Status: AC
  Filled 2019-03-02: qty 10

## 2019-03-02 MED ORDER — OXYCODONE HCL 5 MG PO TABS: 5.0000 mg | ORAL_TABLET | Freq: Once | ORAL | Status: DC | PRN

## 2019-03-02 MED ORDER — FENTANYL CITRATE (PF) 100 MCG/2ML IJ SOLN
50.0000 ug | INTRAMUSCULAR | Status: DC
  Administered 2019-03-02: 50 ug via INTRAVENOUS
  Filled 2019-03-02: qty 2

## 2019-03-02 MED ORDER — DEXAMETHASONE SODIUM PHOSPHATE 10 MG/ML IJ SOLN
10.0000 mg | Freq: Once | INTRAMUSCULAR | Status: AC
  Administered 2019-03-03: 10 mg via INTRAVENOUS
  Filled 2019-03-02: qty 1

## 2019-03-02 MED ORDER — OXYCODONE HCL 5 MG/5ML PO SOLN: 5.0000 mg | Freq: Once | ORAL | Status: DC | PRN

## 2019-03-02 MED ORDER — FENTANYL CITRATE (PF) 100 MCG/2ML IJ SOLN
INTRAMUSCULAR | Status: DC | PRN
  Administered 2019-03-02: 50 ug via INTRAVENOUS
  Administered 2019-03-02 (×2): 25 ug via INTRAVENOUS

## 2019-03-02 MED ORDER — MIDAZOLAM HCL 2 MG/2ML IJ SOLN
1.0000 mg | INTRAMUSCULAR | Status: DC
  Administered 2019-03-02: 1 mg via INTRAVENOUS
  Filled 2019-03-02: qty 2

## 2019-03-02 MED ORDER — HYDROMORPHONE HCL 1 MG/ML IJ SOLN
0.2500 mg | INTRAMUSCULAR | Status: DC | PRN
  Administered 2019-03-02 (×3): 0.5 mg via INTRAVENOUS

## 2019-03-02 MED ORDER — PROPOFOL 500 MG/50ML IV EMUL
INTRAVENOUS | Status: AC
  Filled 2019-03-02: qty 50

## 2019-03-02 MED ORDER — QUINAPRIL HCL 10 MG PO TABS: 40.0000 mg | ORAL_TABLET | Freq: Every day | ORAL | Status: DC

## 2019-03-02 MED ORDER — METHOCARBAMOL 500 MG PO TABS
500.0000 mg | ORAL_TABLET | Freq: Four times a day (QID) | ORAL | Status: DC | PRN
  Administered 2019-03-03 (×2): 500 mg via ORAL
  Filled 2019-03-02 (×2): qty 1

## 2019-03-02 MED ORDER — BUPIVACAINE HCL 0.25 % IJ SOLN
INTRAMUSCULAR | Status: DC | PRN
  Administered 2019-03-02: 30 mL

## 2019-03-02 MED ORDER — KETOROLAC TROMETHAMINE 30 MG/ML IJ SOLN
INTRAMUSCULAR | Status: DC | PRN
  Administered 2019-03-02: 30 mg

## 2019-03-02 MED ORDER — PHENOL 1.4 % MT LIQD
1.0000 | OROMUCOSAL | Status: DC | PRN
  Filled 2019-03-02: qty 177

## 2019-03-02 MED ORDER — ONDANSETRON HCL 4 MG/2ML IJ SOLN
4.0000 mg | Freq: Four times a day (QID) | INTRAMUSCULAR | Status: DC | PRN
  Administered 2019-03-02: 4 mg via INTRAVENOUS
  Filled 2019-03-02: qty 2

## 2019-03-02 MED ORDER — PROPOFOL 10 MG/ML IV BOLUS
INTRAVENOUS | Status: DC | PRN
  Administered 2019-03-02: 150 mg via INTRAVENOUS

## 2019-03-02 MED ORDER — SODIUM CHLORIDE 0.9 % IR SOLN
Status: DC | PRN
  Administered 2019-03-02: 1000 mL

## 2019-03-02 MED ORDER — CHLORHEXIDINE GLUCONATE 4 % EX LIQD: 60.0000 mL | Freq: Once | CUTANEOUS | Status: DC

## 2019-03-02 MED ORDER — METHOCARBAMOL 500 MG IVPB - SIMPLE MED
500.0000 mg | Freq: Four times a day (QID) | INTRAVENOUS | Status: DC | PRN
  Administered 2019-03-02: 500 mg via INTRAVENOUS
  Filled 2019-03-02: qty 50

## 2019-03-02 MED ORDER — METOCLOPRAMIDE HCL 5 MG/ML IJ SOLN
5.0000 mg | Freq: Three times a day (TID) | INTRAMUSCULAR | Status: DC | PRN
  Administered 2019-03-02: 10 mg via INTRAVENOUS
  Filled 2019-03-02: qty 2

## 2019-03-02 MED ORDER — ASPIRIN EC 325 MG PO TBEC
325.0000 mg | DELAYED_RELEASE_TABLET | Freq: Two times a day (BID) | ORAL | Status: DC
  Administered 2019-03-03: 325 mg via ORAL
  Filled 2019-03-02: qty 1

## 2019-03-02 MED ORDER — PROMETHAZINE HCL 25 MG/ML IJ SOLN: 6.2500 mg | INTRAMUSCULAR | Status: DC | PRN

## 2019-03-02 MED ORDER — ONDANSETRON HCL 4 MG PO TABS
4.0000 mg | ORAL_TABLET | Freq: Four times a day (QID) | ORAL | Status: DC | PRN
  Administered 2019-03-02: 4 mg via ORAL
  Filled 2019-03-02: qty 1

## 2019-03-02 MED ORDER — EPHEDRINE SULFATE-NACL 50-0.9 MG/10ML-% IV SOSY
PREFILLED_SYRINGE | INTRAVENOUS | Status: DC | PRN
  Administered 2019-03-02: 15 mg via INTRAVENOUS
  Administered 2019-03-02: 10 mg via INTRAVENOUS
  Administered 2019-03-02: 15 mg via INTRAVENOUS

## 2019-03-02 MED ORDER — POLYETHYLENE GLYCOL 3350 17 G PO PACK
17.0000 g | PACK | Freq: Two times a day (BID) | ORAL | Status: DC
  Filled 2019-03-02: qty 1

## 2019-03-02 MED ORDER — LACTATED RINGERS IV SOLN
INTRAVENOUS | Status: DC
  Administered 2019-03-02 (×2): via INTRAVENOUS

## 2019-03-02 MED ORDER — PANTOPRAZOLE SODIUM 40 MG PO TBEC
40.0000 mg | DELAYED_RELEASE_TABLET | Freq: Every day | ORAL | Status: DC
  Administered 2019-03-03: 40 mg via ORAL
  Filled 2019-03-02: qty 1

## 2019-03-02 MED ORDER — DEXAMETHASONE SODIUM PHOSPHATE 10 MG/ML IJ SOLN
INTRAMUSCULAR | Status: AC
  Filled 2019-03-02: qty 1

## 2019-03-02 MED ORDER — ROPIVACAINE HCL 5 MG/ML IJ SOLN
INTRAMUSCULAR | Status: DC | PRN
  Administered 2019-03-02: 20 mL via PERINEURAL

## 2019-03-02 MED ORDER — PHENYLEPHRINE 40 MCG/ML (10ML) SYRINGE FOR IV PUSH (FOR BLOOD PRESSURE SUPPORT)
PREFILLED_SYRINGE | INTRAVENOUS | Status: AC
  Filled 2019-03-02: qty 10

## 2019-03-02 MED ORDER — LACTATED RINGERS IV SOLN: INTRAVENOUS | Status: DC

## 2019-03-02 MED ORDER — TRANEXAMIC ACID-NACL 1000-0.7 MG/100ML-% IV SOLN
1000.0000 mg | Freq: Once | INTRAVENOUS | Status: AC
  Administered 2019-03-02: 1000 mg via INTRAVENOUS
  Filled 2019-03-02: qty 100

## 2019-03-02 MED ORDER — FERROUS SULFATE 325 (65 FE) MG PO TABS: 325.0000 mg | ORAL_TABLET | Freq: Three times a day (TID) | ORAL | Status: DC

## 2019-03-02 MED ORDER — MENTHOL 3 MG MT LOZG: 1.0000 | LOZENGE | OROMUCOSAL | Status: DC | PRN

## 2019-03-02 MED ORDER — MAGNESIUM CITRATE PO SOLN: 1.0000 | Freq: Once | ORAL | Status: DC | PRN

## 2019-03-02 MED ORDER — SODIUM CHLORIDE 0.9 % IV SOLN
INTRAVENOUS | Status: DC
  Administered 2019-03-02 – 2019-03-03 (×2): via INTRAVENOUS

## 2019-03-02 MED ORDER — BISACODYL 10 MG RE SUPP: 10.0000 mg | Freq: Every day | RECTAL | Status: DC | PRN

## 2019-03-02 MED ORDER — PHENYLEPHRINE 40 MCG/ML (10ML) SYRINGE FOR IV PUSH (FOR BLOOD PRESSURE SUPPORT)
PREFILLED_SYRINGE | INTRAVENOUS | Status: DC | PRN
  Administered 2019-03-02: 80 ug via INTRAVENOUS

## 2019-03-02 MED ORDER — FENTANYL CITRATE (PF) 100 MCG/2ML IJ SOLN
INTRAMUSCULAR | Status: AC
  Filled 2019-03-02: qty 2

## 2019-03-02 MED ORDER — KETOROLAC TROMETHAMINE 30 MG/ML IJ SOLN
INTRAMUSCULAR | Status: AC
  Filled 2019-03-02: qty 1

## 2019-03-02 MED ORDER — METOCLOPRAMIDE HCL 5 MG PO TABS: 5.0000 mg | ORAL_TABLET | Freq: Three times a day (TID) | ORAL | Status: DC | PRN

## 2019-03-02 MED ORDER — METHOCARBAMOL 500 MG IVPB - SIMPLE MED
INTRAVENOUS | Status: AC
  Filled 2019-03-02: qty 50

## 2019-03-02 MED ORDER — BUPIVACAINE HCL (PF) 0.25 % IJ SOLN
INTRAMUSCULAR | Status: AC
  Filled 2019-03-02: qty 30

## 2019-03-02 SURGICAL SUPPLY — 51 items
ADH SKN CLS APL DERMABOND .7 (GAUZE/BANDAGES/DRESSINGS) ×1
BAG SPEC THK2 15X12 ZIP CLS (MISCELLANEOUS)
BAG ZIPLOCK 12X15 (MISCELLANEOUS) IMPLANT
BEARING TIB MENISCAL RT SM 4MM (Joint) IMPLANT
BLADE SAW RECIPROCATING 77.5 (BLADE) ×2 IMPLANT
BLADE SAW SGTL 13.0X1.19X90.0M (BLADE) ×2 IMPLANT
BNDG ELASTIC 6X5.8 VLCR STR LF (GAUZE/BANDAGES/DRESSINGS) ×1 IMPLANT
BOWL SMART MIX CTS (DISPOSABLE) ×2 IMPLANT
BRNG TIB SM 4 PHS 3 RT MEN (Joint) ×1 IMPLANT
CEMENT BONE R 1X40 (Cement) ×2 IMPLANT
COMPONENT TIBIAL OXFRD MEDL RT (Orthopedic Implant) IMPLANT
COVER SURGICAL LIGHT HANDLE (MISCELLANEOUS) ×2 IMPLANT
COVER WAND RF STERILE (DRAPES) IMPLANT
CUFF TOURN SGL QUICK 34 (TOURNIQUET CUFF) ×2
CUFF TRNQT CYL 34X4.125X (TOURNIQUET CUFF) ×1 IMPLANT
DECANTER SPIKE VIAL GLASS SM (MISCELLANEOUS) ×4 IMPLANT
DERMABOND ADVANCED (GAUZE/BANDAGES/DRESSINGS) ×1
DERMABOND ADVANCED .7 DNX12 (GAUZE/BANDAGES/DRESSINGS) ×1 IMPLANT
DRAPE U-SHAPE 47X51 STRL (DRAPES) ×2 IMPLANT
DRESSING AQUACEL AG SP 3.5X10 (GAUZE/BANDAGES/DRESSINGS) ×1 IMPLANT
DRSG AQUACEL AG SP 3.5X10 (GAUZE/BANDAGES/DRESSINGS) ×2
DURAPREP 26ML APPLICATOR (WOUND CARE) ×2 IMPLANT
ELECT REM PT RETURN 15FT ADLT (MISCELLANEOUS) ×2 IMPLANT
GLOVE BIOGEL PI IND STRL 7.5 (GLOVE) ×1 IMPLANT
GLOVE BIOGEL PI IND STRL 8.5 (GLOVE) ×1 IMPLANT
GLOVE BIOGEL PI INDICATOR 7.5 (GLOVE) ×1
GLOVE BIOGEL PI INDICATOR 8.5 (GLOVE) ×1
GLOVE ECLIPSE 8.0 STRL XLNG CF (GLOVE) ×2 IMPLANT
GLOVE ORTHO TXT STRL SZ7.5 (GLOVE) ×4 IMPLANT
GOWN STRL REUS W/TWL 2XL LVL3 (GOWN DISPOSABLE) ×2 IMPLANT
GOWN STRL REUS W/TWL LRG LVL3 (GOWN DISPOSABLE) ×2 IMPLANT
HOLDER FOLEY CATH W/STRAP (MISCELLANEOUS) IMPLANT
KIT TURNOVER KIT A (KITS) IMPLANT
LEGGING LITHOTOMY PAIR STRL (DRAPES) ×2 IMPLANT
MANIFOLD NEPTUNE II (INSTRUMENTS) ×2 IMPLANT
NDL SAFETY ECLIPSE 18X1.5 (NEEDLE) ×1 IMPLANT
NEEDLE HYPO 18GX1.5 SHARP (NEEDLE) ×2
PACK ICE MAXI GEL EZY WRAP (MISCELLANEOUS) ×2 IMPLANT
PACK TOTAL KNEE CUSTOM (KITS) ×2 IMPLANT
PEG FEMORAL PEGGED STRL SM (Knees) ×1 IMPLANT
SUT MNCRL AB 4-0 PS2 18 (SUTURE) ×2 IMPLANT
SUT STRATAFIX PDS+ 0 24IN (SUTURE) ×2 IMPLANT
SUT VIC AB 1 CT1 36 (SUTURE) ×2 IMPLANT
SUT VIC AB 2-0 CT1 27 (SUTURE) ×4
SUT VIC AB 2-0 CT1 TAPERPNT 27 (SUTURE) ×2 IMPLANT
SYR 3ML LL SCALE MARK (SYRINGE) ×2 IMPLANT
TIBIAL OXFORD MEDIAL RT (Orthopedic Implant) ×2 IMPLANT
TRAY FOLEY MTR SLVR 16FR STAT (SET/KITS/TRAYS/PACK) ×2 IMPLANT
UNICOMPARTMENTAL KNEE MENISCAL (Joint) ×2 IMPLANT
WRAP KNEE MAXI GEL POST OP (GAUZE/BANDAGES/DRESSINGS) ×1 IMPLANT
YANKAUER SUCT BULB TIP NO VENT (SUCTIONS) ×2 IMPLANT

## 2019-03-02 NOTE — Op Note (Signed)
NAME: Bruce [name]    MEDICAL RECORD NO.: [id number]   FACILITY: [facility] BIRTH: [birth year]  PHYSICIAN: Pietro Cassis. Alvan Dame, M.D.    DATE OF PROCEDURE: 03/02/2019    OPERATIVE REPORT   PREOPERATIVE DIAGNOSIS: right knee medial compartment osteoarthritis.   POSTOPERATIVE DIAGNOSIS: right knee medial compartment osteoarthritis.   PROCEDURE: right partial knee replacement utilizing Biomet Oxford knee  component, size small femur, a right medial size B tibial tray with a size 4 mm insert.   SURGEON: Pietro Cassis. Alvan Dame, M.D.   ASSISTANT: Danae Orleans, PAC.  Please note that [name] was present for the entirety of the case,  utilized for preoperative positioning, perioperative retractor  management, general facilitation of the case and primary wound closure.   ANESTHESIA: regional and general.   SPECIMENS: None.   COMPLICATIONS: None.  DRAINS: None   TOURNIQUET TIME: 31 minutes at 250 mmHg.   INDICATIONS FOR PROCEDURE: The patient is a 83 y.o. patient of mine who presented for evaluation of right knee pain.  They presented with primary complaints of pain on the medial side of their knee. Radiographs revealed advanced medial compartment arthritis with specifically an antero-medial wear pattern.  There was bone on bone changes noted with subchondral sclerosis and osteophytes present. The patient has had progressive problems failing to respond to conservative measures of medications, injections and activity modification. Risks of infection, DVT, component failure, need for future revision surgery were all discussed and reviewed.  Consent was obtained for benefit of pain relief.   PROCEDURE IN DETAIL: The patient was brought to the operative theater.  Once adequate anesthesia, preoperative antibiotics, 2 gm of Ancef administered, 1 gm of Tranexamic Acid, and 10 mg of Decadron given the patient was positioned in supine position with a right thigh tourniquet placed. The operative  leg was positioned over the Oxford leg holder such to allow for 120 degrees of flexion. The right lower extremity was prepped and draped in sterile fashion. A time-out  was performed identifying the patient, planned procedure, and extremity.  The operative leg was exsanguinated and the tourniquet elevated to 250 mmHg. A midline  incision was made from the proximal pole of the patella to the tibial tubercle. A  soft tissue plane was created and partial median arthrotomy was then  made to allow for subluxation of the patella. Following initial synovectomy and  debridement, the osteophytes were removed off the medial aspect of the knee and within the notch as needed.  The femur was first sized using the sizing spoons and determine to be a small femur.  The femoral spoon was then left in place to be attached to the extra medullary tibial guide.  The tibial extramedullary guide was positioned over the anterior crest of the tibia  and pinned into position, perpendicular in the coronal plane with appropriate slope.  Utilizing the size 4 G clamp I connected the cutting block tray to the femoral spine.  Upon confirming the orientation of the cutting block as it pertained to the extra medullary guide the tibial tray was pinned in place.  I then used a reciprocating saw along the medial border of the medial tibial spine.  The reciprocating saw was then used to complete the proximal tibial osteotomy.  The proximal tibial bone was removed and identified to have complete loss of cartilage and anterior medial pattern.  The cut surface of the tibia was found to be best covered with the B tibial tray.  At this  point I checked the tension of the medial collateral ligament at 90 degrees.  With the retractors out of the wound and the knee held at 90 degrees the 4 feeler gauge had appropriate tension on the medial ligament.   At this point, the femoral canal was opened with a 6 mm drill followed by the starting awl and then  the  intramedullary was positioned within the canal.  Then the small posterior cutting block guide set at 4 to match the flexion gap was positioned onto the cut surface of the tibia and then linked to the intramedullary rod using the articulating link.  This posterior cutting block guide was positioned over the middle portion of the medial femoral condyle in line with the normal kinematic motion of the knee.  The 2 drill holes were then made into the distal femur.  Once this was done the posterior cutting block guide and length were removed.    The posterior guide was then impacted into place on the distal femur and the posterior  femoral cut made.   At this point, based on the tension on the medial collateral ligament at 90 degrees as previously assessed I selected the size 4 spigot and pushed it to the distal femur.  I then milled the distal femur.  Excess of bone from the distal femur and then medially and laterally were removed using osteotome and rondure.  We now did our first trial reduction with the small femur and the B tibial tray.  I now assessed the ligament balancing at 90 and 20 degrees of flexion.    The tension of the medial collateral ligament at 90 and 20 degrees of flexion was symmetric with the 4 feeler gauge.  Given these findings, the trial femoral component was removed. Final preparation of tibia was carried out by pinning it in position. Then  using a reciprocating saw I removed bone for the keel. Further bone was  removed with an osteotome.  Trial reduction was now carried out with the small femur, the right medial keeled size B tibia, and the size 4 lollipop insert. The balance of the  ligaments appeared to be symmetric at 20 degrees and 90 degrees. Given  all these findings, the trial components were removed.  The final components were opened. The knee was irrigated with  normal saline solution.  The medial synovial capsule junction of the knee was injected with 30 cc of  quarter percent Marcaine with epinephrine, 1 cc of Toradol, and 30 cc of normal saline.  Any final debridements of the soft tissue or osteophytes was carried out at this point.  Sclerotic bone on the distal femur and proximal tibia were drilled with the drill and the set.    The final components were cemented onto a clean and dried cut surfaces of bone. Excessive cement was removed and the knee was then held at 45 degrees of flexion with the size 4 feeler gauge until the cement cured. Excess cement was removed throughout the knee. Tourniquet was let down  after 31 minutes. After the cement had fully cured and excessive cement  was removed throughout the knee there was no visualized cement present.  Before selecting the final insert re-trialed with the 4 lollipop insert.  Given that these findings confirmed our initial trial reduction we selected the final size 4 mm, right medial insert to match the small femur.  Following irrigation of the knee this final insert was snapped into place.   The extensor mechanism  was then reapproximated using a #1 Vicryl and #1 Stratafix suture with the knee in flexion. The  remaining wound was closed with 2-0 Vicryl and a running 4-0 Monocryl.  The knee was cleaned, dried, and dressed sterilely using Dermabond and  Aquacel dressing. The patient  was brought to the recovery room, Ace wrap in place, tolerating the  procedure well.     Pietro Cassis Alvan Dame, M.D.

## 2019-03-02 NOTE — Anesthesia Procedure Notes (Signed)
Procedure Name: LMA Insertion Date/Time: 03/02/2019 8:57 AM Performed by: Talbot Grumbling, CRNA Pre-anesthesia Checklist: Patient identified, Emergency Drugs available, Suction available and Patient being monitored Patient Re-evaluated:Patient Re-evaluated prior to induction Oxygen Delivery Method: Circle system utilized Preoxygenation: Pre-oxygenation with 100% oxygen Induction Type: IV induction Ventilation: Mask ventilation without difficulty LMA: LMA inserted LMA Size: 4.0 Number of attempts: 1 Placement Confirmation: positive ETCO2 and breath sounds checked- equal and bilateral Tube secured with: Tape Dental Injury: Teeth and Oropharynx as per pre-operative assessment

## 2019-03-02 NOTE — Transfer of Care (Signed)
Immediate Anesthesia Transfer of Care Note  Patient: Bruce [name]  Procedure(s) Performed: UNICOMPARTMENTAL KNEE Medially (Right Knee)  Patient Location: PACU  Anesthesia Type:General  Level of Consciousness: sedated  Airway & Oxygen Therapy: Patient Spontanous Breathing and Patient connected to face mask oxygen  Post-op Assessment: Report given to RN and Post -op Vital signs reviewed and stable  Post vital signs: Reviewed and stable  Last Vitals:  Vitals Value Taken Time  BP    Temp    Pulse    Resp    SpO2      Last Pain:  Vitals:   03/02/19 0626  TempSrc: Oral      Patients Stated Pain Goal: 3 ([id number])  Complications: No apparent anesthesia complications

## 2019-03-02 NOTE — Anesthesia Procedure Notes (Signed)
Anesthesia Regional Block: Adductor canal block   Pre-Anesthetic Checklist: ,, timeout performed, Correct Patient, Correct Site, Correct Laterality, Correct Procedure, Correct Position, site marked, Risks and benefits discussed,  Surgical consent,  Pre-op evaluation,  At surgeon's request and post-op pain management  Laterality: Right  Prep: chloraprep       Needles:  Injection technique: Single-shot  Needle Type: Stimiplex     Needle Length: 9cm  Needle Gauge: 21     Additional Needles:   Procedures:,,,, ultrasound used (permanent image in chart),,,,  Narrative:  Start time: 03/02/2019 7:55 AM End time: 03/02/2019 8:00 AM Injection made incrementally with aspirations every 5 mL.  Performed by: Personally  Anesthesiologist: Lynda Rainwater, MD

## 2019-03-02 NOTE — Interval H&P Note (Signed)
History and Physical Interval Note:  03/02/2019 7:16 AM  Bruce [name]  has presented today for surgery, with the diagnosis of Right knee medial compartmental osteoarthritis.  The various methods of treatment have been discussed with the patient and family. After consideration of risks, benefits and other options for treatment, the patient has consented to  Procedure(s) with comments: UNICOMPARTMENTAL KNEE Medially (Right) - 90 mins as a surgical intervention.  The patient's history has been reviewed, patient examined, no change in status, stable for surgery.  I have reviewed the patient's chart and labs.  Questions were answered to the patient's satisfaction.     [name]

## 2019-03-02 NOTE — Evaluation (Signed)
Physical Therapy Evaluation Patient Details Name: Bruce [name] MRN: [id number] DOB: [birth year] Today's Date: 03/02/2019   History of Present Illness  83 y.o. male s/p Rt uni knee arthroplasty on 03/02/19 with PMH significant for HTN, CAD< past MI, OA, and past Lt partial knee arthroplasty.    Clinical Impression  Bruce [name] is a 83 y.o. male POD 0 s/p Rt partial knee arthroplasty. Patient reports independence with mobility at baseline. Patient is now limited by functional impairments (see PT problem list below) and requires min assist for transfers and gait with RW. Patient was able to ambulate ~90 feet with RW and perform stair training with min assist. Patient reported nausea at EOS and RN alerted for pain and nausea medication. Symptoms improved with seated rest and ice. Patient instructed in exercise to facilitate ROM and circulation to manage edema. Patient will benefit from continued skilled PT interventions to address impairments and progress towards PLOF. Acute PT will follow to progress mobility and stair training in preparation for safe discharge home.     Follow Up Recommendations Follow surgeon's recommendation for DC plan and follow-up therapies    Equipment Recommendations  None recommended by PT    Recommendations for Other Services       Precautions / Restrictions Precautions Precautions: Fall Restrictions Weight Bearing Restrictions: No      Mobility  Bed Mobility Overal bed mobility: Needs Assistance Bed Mobility: Supine to Sit     Supine to sit: HOB elevated;Supervision     General bed mobility comments: pt slow to move, pt using bed rails  Transfers Overall transfer level: Needs assistance Equipment used: Rolling walker (2 wheeled) Transfers: Sit to/from Stand Sit to Stand: Min assist         General transfer comment: cues for safe hand placement and technique, assist to initiate power up  Ambulation/Gait Ambulation/Gait assistance: Min  assist;Min guard Gait Distance (Feet): 90 Feet Assistive device: Rolling walker (2 wheeled) Gait Pattern/deviations: Step-to pattern;Antalgic;Decreased step length - right;Decreased stance time - right;Decreased stride length;Trunk flexed Gait velocity: decreased   General Gait Details: pt requires cues for safe hand placement initially and maintained throughout, pt maintained safe proximity within RW after initial cuing, no overt LOB noted  Stairs Stairs: Yes Stairs assistance: Min assist Stair Management: Two rails;Forwards;Step to pattern Number of Stairs: 3 General stair comments: pt ascend/descend "up with Lt and down with Rt", cues for sequencing at start and pt performed well, pt reporting nausea after completing stair mobility and a seat was provided.  Wheelchair Mobility    Modified Rankin (Stroke Patients Only)       Balance Overall balance assessment: Needs assistance Sitting-balance support: Feet supported;No upper extremity supported Sitting balance-Leahy Scale: Good     Standing balance support: During functional activity;Bilateral upper extremity supported Standing balance-Leahy Scale: Fair            Pertinent Vitals/Pain Pain Assessment: Faces Faces Pain Scale: Hurts even more Pain Location: Rt knee Pain Descriptors / Indicators: Aching;Sore Pain Intervention(s): Limited activity within patient's tolerance;Monitored during session;Ice applied;Repositioned;Patient requesting pain meds-RN notified(pt limited by nausea at EOS, RN notified)    Home Living Family/patient expects to be discharged to:: Private residence Living Arrangements: Spouse/significant other Available Help at Discharge: Family Type of Home: House Home Access: Stairs to enter Entrance Stairs-Rails: Can reach both;None Entrance Stairs-Number of Steps: 5 stairs with 2 hand rails or 3 with no rails Home Layout: One level Home Equipment: Grab bars - tub/shower;Walker - 2 wheels;Cane -  single point      Prior Function Level of Independence: Independent               Hand Dominance        Extremity/Trunk Assessment   Upper Extremity Assessment Upper Extremity Assessment: Overall WFL for tasks assessed    Lower Extremity Assessment Lower Extremity Assessment: Generalized weakness;RLE deficits/detail LLE Deficits / Details: pt with good quad activation and no extensor lag with SLR LLE Sensation: WNL LLE Coordination: WNL    Cervical / Trunk Assessment Cervical / Trunk Assessment: Normal  Communication   Communication: No difficulties  Cognition Arousal/Alertness: Awake/alert Behavior During Therapy: WFL for tasks assessed/performed Overall Cognitive Status: Within Functional Limits for tasks assessed               General Comments      Exercises Total Joint Exercises Ankle Circles/Pumps: AROM;Seated;10 reps;Both Quad Sets: AROM;10 reps;Supine;Right Heel Slides: AROM;5 reps;Supine;Right   Assessment/Plan    PT Assessment Patient needs continued PT services  PT Problem List Decreased strength;Decreased balance;Decreased mobility;Decreased range of motion;Decreased activity tolerance;Decreased knowledge of use of DME;Pain       PT Treatment Interventions DME instruction;Functional mobility training;Balance training;Patient/family education;Gait training;Therapeutic activities;Modalities;Therapeutic exercise;Stair training    PT Goals (Current goals can be found in the Care Plan section)  Acute Rehab PT Goals Patient Stated Goal: pt wanting to go home day of surgery, pt wants to improve walking and mobility PT Goal Formulation: With patient Time For Goal Achievement: 03/09/19 Potential to Achieve Goals: Good    Frequency 7X/week    AM-PAC PT "6 Clicks" Mobility  Outcome Measure Help needed turning from your back to your side while in a flat bed without using bedrails?: A Little Help needed moving from lying on your back to sitting  on the side of a flat bed without using bedrails?: A Little Help needed moving to and from a bed to a chair (including a wheelchair)?: A Little Help needed standing up from a chair using your arms (e.g., wheelchair or bedside chair)?: A Little Help needed to walk in hospital room?: A Little Help needed climbing 3-5 steps with a railing? : A Little 6 Click Score: 18    End of Session Equipment Utilized During Treatment: Gait belt Activity Tolerance: Patient tolerated treatment well;Other (comment)(pt limited by nausea at EOS) Patient left: with call bell/phone within reach;in chair;with family/visitor present;with chair alarm set Nurse Communication: Mobility status PT Visit Diagnosis: Difficulty in walking, not elsewhere classified (R26.2);Muscle weakness (generalized) (M62.81);Pain Pain - Right/Left: Right Pain - part of body: Knee    Time: [id number] PT Time Calculation (min) (ACUTE ONLY): 32 min   Charges:   PT Evaluation $PT Eval Low Complexity: 1 Low PT Treatments $Gait Training: 8-22 mins        Kipp Brood, PT, DPT Physical Therapist with [facility]  03/02/2019 5:38 PM

## 2019-03-02 NOTE — Discharge Instructions (Signed)

## 2019-03-02 NOTE — Plan of Care (Signed)

## 2019-03-02 NOTE — Progress Notes (Signed)
AssistedDr. Miller with right, ultrasound guided, adductor canal block. Side rails up, monitors on throughout procedure. See vital signs in flow sheet. Tolerated Procedure well.  

## 2019-03-02 NOTE — Anesthesia Postprocedure Evaluation (Signed)
Anesthesia Post Note  Patient: Bruce [name]  Procedure(s) Performed: UNICOMPARTMENTAL KNEE Medially (Right Knee)     Patient location during evaluation: PACU Anesthesia Type: Spinal Level of consciousness: oriented and awake and alert Pain management: pain level controlled Vital Signs Assessment: post-procedure vital signs reviewed and stable Respiratory status: spontaneous breathing and respiratory function stable Cardiovascular status: blood pressure returned to baseline and stable Postop Assessment: no headache, no backache and no apparent nausea or vomiting Anesthetic complications: no    Last Vitals:  Vitals:   03/02/19 1130 03/02/19 1145  BP: (!) 147/78 137/82  Pulse: 68 72  Resp: 14 15  Temp: 37.1 C 36.4 C  SpO2: 98% 97%    Last Pain:  Vitals:   03/02/19 1145  TempSrc:   PainSc: [place]

## 2019-03-03 ENCOUNTER — Encounter (HOSPITAL_COMMUNITY): Payer: Self-pay | Admitting: Orthopedic Surgery

## 2019-03-03 DIAGNOSIS — E663 Overweight: Secondary | ICD-10-CM | POA: Diagnosis not present

## 2019-03-03 DIAGNOSIS — Z955 Presence of coronary angioplasty implant and graft: Secondary | ICD-10-CM | POA: Diagnosis not present

## 2019-03-03 DIAGNOSIS — Z6829 Body mass index (BMI) 29.0-29.9, adult: Secondary | ICD-10-CM | POA: Diagnosis not present

## 2019-03-03 DIAGNOSIS — Z87442 Personal history of urinary calculi: Secondary | ICD-10-CM | POA: Diagnosis not present

## 2019-03-03 DIAGNOSIS — H3530 Unspecified macular degeneration: Secondary | ICD-10-CM | POA: Diagnosis not present

## 2019-03-03 DIAGNOSIS — I2510 Atherosclerotic heart disease of native coronary artery without angina pectoris: Secondary | ICD-10-CM | POA: Diagnosis not present

## 2019-03-03 DIAGNOSIS — G4730 Sleep apnea, unspecified: Secondary | ICD-10-CM | POA: Diagnosis not present

## 2019-03-03 DIAGNOSIS — I252 Old myocardial infarction: Secondary | ICD-10-CM | POA: Diagnosis not present

## 2019-03-03 DIAGNOSIS — M1711 Unilateral primary osteoarthritis, right knee: Secondary | ICD-10-CM | POA: Diagnosis not present

## 2019-03-03 LAB — CBC
HCT: 35.2 % — ABNORMAL LOW (ref 39.0–52.0)
Hemoglobin: 11.5 g/dL — ABNORMAL LOW (ref 13.0–17.0)
MCH: 31.9 pg (ref 26.0–34.0)
MCHC: 32.7 g/dL (ref 30.0–36.0)
MCV: 97.5 fL (ref 80.0–100.0)
Platelets: 139 10*3/uL — ABNORMAL LOW (ref 150–400)
RBC: 3.61 MIL/uL — ABNORMAL LOW (ref 4.22–5.81)
RDW: 13.2 % (ref 11.5–15.5)
WBC: 8.9 10*3/uL (ref 4.0–10.5)
nRBC: 0 % (ref 0.0–0.2)

## 2019-03-03 LAB — BASIC METABOLIC PANEL
Anion gap: 9 (ref 5–15)
BUN: 14 mg/dL (ref 8–23)
CO2: 21 mmol/L — ABNORMAL LOW (ref 22–32)
Calcium: 8.2 mg/dL — ABNORMAL LOW (ref 8.9–10.3)
Chloride: 105 mmol/L (ref 98–111)
Creatinine, Ser: 1.27 mg/dL — ABNORMAL HIGH (ref 0.61–1.24)
GFR calc Af Amer: >60 mL/min (ref >60)
GFR calc non Af Amer: 53 mL/min — ABNORMAL LOW (ref >60)
Glucose, Bld: 109 mg/dL — ABNORMAL HIGH (ref 70–99)
Potassium: 4.1 mmol/L (ref 3.5–5.1)
Sodium: 135 mmol/L (ref 135–145)

## 2019-03-03 MED ORDER — DOCUSATE SODIUM 100 MG PO CAPS: 100.0000 mg | ORAL_CAPSULE | Freq: Two times a day (BID) | ORAL | 0 refills | Status: DC

## 2019-03-03 MED ORDER — HYDROCODONE-ACETAMINOPHEN 5-325 MG PO TABS
1.0000 | ORAL_TABLET | Freq: Four times a day (QID) | ORAL | Status: DC | PRN
  Administered 2019-03-03: 1 via ORAL
  Filled 2019-03-03: qty 1

## 2019-03-03 MED ORDER — METHOCARBAMOL 500 MG PO TABS: 500.0000 mg | ORAL_TABLET | Freq: Four times a day (QID) | ORAL | 0 refills | Status: DC | PRN

## 2019-03-03 MED ORDER — POLYETHYLENE GLYCOL 3350 17 G PO PACK: 17.0000 g | PACK | Freq: Two times a day (BID) | ORAL | 0 refills | Status: DC

## 2019-03-03 MED ORDER — ASPIRIN 81 MG PO CHEW: 81.0000 mg | CHEWABLE_TABLET | Freq: Two times a day (BID) | ORAL | 0 refills | Status: AC

## 2019-03-03 MED ORDER — HYDROCODONE-ACETAMINOPHEN 5-325 MG PO TABS: 1.0000 | ORAL_TABLET | ORAL | 0 refills | Status: DC | PRN

## 2019-03-03 MED ORDER — FERROUS SULFATE 325 (65 FE) MG PO TABS: 325.0000 mg | ORAL_TABLET | Freq: Three times a day (TID) | ORAL | 0 refills | Status: DC

## 2019-03-03 NOTE — Progress Notes (Signed)
Physical Therapy Treatment Patient Details Name: Bruce [name] MRN: [id number] DOB: 1936-05-18 Today's Date: 03/03/2019    History of Present Illness 83 y.o. male s/p Rt uni knee arthroplasty on 03/02/19 with PMH significant for HTN, CAD< past MI, OA, and past Lt partial knee arthroplasty.    PT Comments    POD # 1  Assisted with amb, stairs Then returned to room to perform some TE's following HEP handout.  Instructed on proper tech, freq as well as use of ICE.   Addressed all mobility questions, discussed appropriate activity, educated on use of ICE.  Pt ready for D/C to home.   Follow Up Recommendations  Follow surgeon's recommendation for DC plan and follow-up therapies     Equipment Recommendations  None recommended by PT    Recommendations for Other Services       Precautions / Restrictions Precautions Precautions: Fall Restrictions Weight Bearing Restrictions: No    Mobility  Bed Mobility               General bed mobility comments: OOB in recliner  Transfers Overall transfer level: Needs assistance Equipment used: Rolling walker (2 wheeled) Transfers: Sit to/from Stand Sit to Stand: Supervision;Min guard         General transfer comment: cues for safe hand placement and technique, assist to initiate power up  Ambulation/Gait Ambulation/Gait assistance: Supervision;Min guard Gait Distance (Feet): 125 Feet Assistive device: Rolling walker (2 wheeled) Gait Pattern/deviations: Step-to pattern;Antalgic;Decreased step length - right;Decreased stance time - right;Decreased stride length;Trunk flexed Gait velocity: decreased   General Gait Details: one VC safety with turns.  Tolerated a greater distance.   Stairs Stairs: Yes Stairs assistance: Min assist Stair Management: Two rails;Forwards;Step to pattern Number of Stairs: 3 General stair comments: tolerated well   Wheelchair Mobility    Modified Rankin (Stroke Patients Only)        Balance                                            Cognition Arousal/Alertness: Awake/alert Behavior During Therapy: WFL for tasks assessed/performed Overall Cognitive Status: Within Functional Limits for tasks assessed                                        Exercises      General Comments        Pertinent Vitals/Pain Pain Assessment: 0-10 Pain Score: 3  Pain Location: Rt knee Pain Descriptors / Indicators: Aching;Sore Pain Intervention(s): Monitored during session;Premedicated before session;Ice applied;Repositioned    Home Living                      Prior Function            PT Goals (current goals can now be found in the care plan section) Progress towards PT goals: Progressing toward goals    Frequency    7X/week      PT Plan Current plan remains appropriate    Co-evaluation              AM-PAC PT "6 Clicks" Mobility   Outcome Measure  Help needed turning from your back to your side while in a flat bed without using bedrails?: A Little Help needed moving from lying on your back to  sitting on the side of a flat bed without using bedrails?: A Little Help needed moving to and from a bed to a chair (including a wheelchair)?: A Little Help needed standing up from a chair using your arms (e.g., wheelchair or bedside chair)?: A Little Help needed to walk in hospital room?: A Little Help needed climbing 3-5 steps with a railing? : A Little 6 Click Score: 18    End of Session Equipment Utilized During Treatment: Gait belt Activity Tolerance: Patient tolerated treatment well;Other (comment) Patient left: with call bell/phone within reach;in chair;with family/visitor present;with chair alarm set Nurse Communication: (pt ready for D/C to home) PT Visit Diagnosis: Difficulty in walking, not elsewhere classified (R26.2);Muscle weakness (generalized) (M62.81);Pain Pain - Right/Left: Right Pain - part of body:  Knee     Time: 1000-1028 PT Time Calculation (min) (ACUTE ONLY): 28 min  Charges:  $Gait Training: 8-22 mins $Therapeutic Exercise: 8-22 mins                     {Jaggar Benko  PTA Acute  Rehabilitation Services Pager      [phone] Office      [phone]

## 2019-03-03 NOTE — Progress Notes (Signed)
     Subjective: 1 Day Post-Op Procedure(s) (LRB): UNICOMPARTMENTAL KNEE Medially (Right)   Seen by Dr Alvan Dame. Patient reports pain as mild, pain controlled.  No reported events throughout the night.  The procedure, findings and expectations moving forward were discussed with the patient.  Patient is ready to be discharged home, if he does well with therapy.  Patient follow-up in the clinic in 2 weeks.  Patient knows to call with any questions or concerns.     Objective:   VITALS:   Vitals:   03/03/19 0231 03/03/19 0630  BP: 113/64 122/61  Pulse: (!) 48 (!) 55  Resp: 17 18  Temp: 98 F (36.7 C) 98.1 F (36.7 C)  SpO2: 97% 94%    Dorsiflexion/Plantar flexion intact Incision: dressing C/D/I No cellulitis present Compartment soft  LABS Recent Labs    03/03/19 0250  HGB 11.5*  HCT 35.2*  WBC 8.9  PLT 139*    Recent Labs    03/03/19 0250  NA 135  K 4.1  BUN 14  CREATININE 1.27*  GLUCOSE 109*     Assessment/Plan: 1 Day Post-Op Procedure(s) (LRB): UNICOMPARTMENTAL KNEE Medially (Right) Foley cath d/c'ed Advance diet Up with therapy D/C IV fluids Discharge home Follow up in 2 weeks at [facility] Follow up with OLIN,Kenshawn Maciolek D in 2 weeks.  Contact information:  [facility] [address] [id number]    Overweight (BMI 25-29.9) Estimated body mass index is 29.59 kg/m as calculated from the following:   Height as of this encounter: 5\' 5"  (1.651 m).   Weight as of this encounter: 80.7 kg. Patient also counseled that weight may inhibit the healing process Patient counseled that losing weight will help with future health issues       [place] Pugh. Nakiya Rallis   PAC  03/03/2019, 8:24 AM

## 2019-03-06 DIAGNOSIS — M25561 Pain in right knee: Secondary | ICD-10-CM | POA: Diagnosis not present

## 2019-03-08 NOTE — Discharge Summary (Signed)
Physician Discharge Summary  Patient ID: Bruce [name] MRN: [id number] DOB/AGE: August 14, 1935 83 y.o.  Admit date: 03/02/2019 Discharge date: 03/03/2019   Procedures:  Procedure(s) (LRB): UNICOMPARTMENTAL KNEE Medially (Right)  Attending Physician:  Dr. Paralee Cancel   Admission Diagnoses:   Right knee primary OA / pain  Discharge Diagnoses:  Active Problems:   S/P right unicompartmental knee replacement   Status post right unicompartmental knee replacement   Overweight (BMI 25.0-29.9)  Past Medical History:  Diagnosis Date  . Arthritis   . Coronary artery disease    ETT-Myoview EF 51%, inf infarct, no ischemia, low risk  . History of echocardiogram    Echo 3/17: EF 55-60%, inferior HK, grade 1 diastolic dysfunctione  . History of kidney stones   . Hypertension   . Macular degeneration   . Myocardial infarction ([place]) 2000   with stent  . PONV (postoperative nausea and vomiting)   . Sleep apnea    cpap in use     HPI:    Bruce [name], 83 y.o. male, has a history of pain and functional disability in the right knee due to arthritis and has failed non-surgical conservative treatments for greater than 12 weeks to includeactivity modification.  Onset of symptoms was gradual, starting 1 years ago with gradually worsening course since that time. The patient noted prior procedures on the knee to include  arthroscopy and menisectomy on the right knee(s).  Patient currently rates pain in the right knee(s) at 7 out of 10 with activity. Patient has worsening of pain with activity and weight bearing and pain that interferes with activities of daily living.  Patient has evidence of evidence of lateral and patellofemoral compartment joint space loss. by imaging studies. There is no active infection.  PCP: Hulan Fess, MD   Discharged Condition: good  Hospital Course:  Patient underwent the above stated procedure on 03/02/2019. Patient tolerated the procedure well and brought to the  recovery room in good condition and subsequently to the floor.  POD #1 BP: 122/61 ; Pulse: 55 ; Temp: 98.1 F (36.7 C) ; Resp: 18 Patient reports pain as mild, pain controlled.  No reported events throughout the night.  The procedure, findings and expectations moving forward were discussed with the patient.  Patient is ready to be discharged home. Neurovascular intact and incision: dressing C/D/I.   LABS  Basename    HGB     11.5  HCT     35.2    Discharge Exam: General appearance: alert, cooperative and no distress Extremities: Homans sign is negative, no sign of DVT, no edema, redness or tenderness in the calves or thighs and no ulcers, gangrene or trophic changes  Disposition:  Home with follow up in 2 weeks   Follow-up Information    Paralee Cancel, MD. Schedule an appointment as soon as possible for a visit in 2 weeks.   Specialty: Orthopedic Surgery Contact information: [address] [place] [address] [id number]           Discharge Instructions    Call MD / Call 911   Complete by: As directed    If you experience chest pain or shortness of breath, CALL 911 and be transported to the hospital emergency room.  If you develope a fever above 101 F, pus (white drainage) or increased drainage or redness at the wound, or calf pain, call your surgeon's office.   Change dressing   Complete by: As directed    Maintain  surgical dressing until follow up in the clinic. If the edges start to pull up, may reinforce with tape. If the dressing is no longer working, may remove and cover with gauze and tape, but must keep the area dry and clean.  Call with any questions or concerns.   Constipation Prevention   Complete by: As directed    Drink plenty of fluids.  Prune juice may be helpful.  You may use a stool softener, such as Colace (over the counter) 100 mg twice a day.  Use MiraLax (over the counter) for constipation as needed.   Diet - low sodium heart healthy    Complete by: As directed    Discharge instructions   Complete by: As directed    Maintain surgical dressing until follow up in the clinic. If the edges start to pull up, may reinforce with tape. If the dressing is no longer working, may remove and cover with gauze and tape, but must keep the area dry and clean.  Follow up in 2 weeks at [facility]. Call with any questions or concerns.   Increase activity slowly as tolerated   Complete by: As directed    Weight bearing as tolerated with assist device (walker, cane, etc) as directed, use it as long as suggested by your surgeon or therapist, typically at least 4-6 weeks.   TED hose   Complete by: As directed    Use stockings (TED hose) for 2 weeks on both leg(s).  You may remove them at night for sleeping.      Allergies as of 03/03/2019      Reactions   Aldactone [spironolactone] Rash   Aldomet [methyldopa] Itching, Rash   Plavix [clopidogrel Bisulfate] Itching, Rash      Medication List    STOP taking these medications   aspirin EC 81 MG tablet Replaced by: aspirin 81 MG chewable tablet     TAKE these medications   aspirin 81 MG chewable tablet Commonly known as: Aspirin Childrens Chew 1 tablet (81 mg total) by mouth 2 (two) times daily. Take for 4 weeks, then resume regular dose. Replaces: aspirin EC 81 MG tablet   atorvastatin 80 MG tablet Commonly known as: LIPITOR Take 80 mg by mouth daily.   beta carotene w/minerals tablet Take 1 tablet by mouth daily.   docusate sodium 100 MG capsule Commonly known as: Colace Take 1 capsule (100 mg total) by mouth 2 (two) times daily.   doxazosin 8 MG tablet Commonly known as: CARDURA Take 8 mg by mouth at bedtime.   ferrous sulfate 325 (65 FE) MG tablet Commonly known as: FerrouSul Take 1 tablet (325 mg total) by mouth 3 (three) times daily with meals for 14 days.   HYDROcodone-acetaminophen 5-325 MG tablet Commonly known as: Norco Take 1-2 tablets by mouth  every 4 (four) hours as needed for moderate pain or severe pain.   methocarbamol 500 MG tablet Commonly known as: Robaxin Take 1 tablet (500 mg total) by mouth every 6 (six) hours as needed for muscle spasms.   multivitamin with minerals Tabs tablet Take 1 tablet by mouth daily.   omeprazole 40 MG capsule Commonly known as: PRILOSEC Take 40 mg by mouth daily.   polyethylene glycol 17 g packet Commonly known as: MIRALAX / GLYCOLAX Take 17 g by mouth 2 (two) times daily.   quinapril 40 MG tablet Commonly known as: ACCUPRIL Take 1 tablet (40 mg total) by mouth at bedtime.  Discharge Care Instructions  (From admission, onward)         Start     Ordered   03/03/19 0000  Change dressing    Comments: Maintain surgical dressing until follow up in the clinic. If the edges start to pull up, may reinforce with tape. If the dressing is no longer working, may remove and cover with gauze and tape, but must keep the area dry and clean.  Call with any questions or concerns.   03/03/19 [id number]           Signed: [place]. Ronny Ruddell   PA-C  03/08/2019, 9:06 AM

## 2019-03-09 DIAGNOSIS — M25561 Pain in right knee: Secondary | ICD-10-CM | POA: Diagnosis not present

## 2019-03-15 DIAGNOSIS — M25561 Pain in right knee: Secondary | ICD-10-CM | POA: Diagnosis not present

## 2019-03-23 DIAGNOSIS — G4733 Obstructive sleep apnea (adult) (pediatric): Secondary | ICD-10-CM | POA: Diagnosis not present

## 2019-03-23 DIAGNOSIS — M25561 Pain in right knee: Secondary | ICD-10-CM | POA: Diagnosis not present

## 2019-03-28 DIAGNOSIS — M25561 Pain in right knee: Secondary | ICD-10-CM | POA: Diagnosis not present

## 2019-04-09 ENCOUNTER — Other Ambulatory Visit: Payer: Self-pay | Admitting: Cardiology

## 2019-04-19 DIAGNOSIS — Z471 Aftercare following joint replacement surgery: Secondary | ICD-10-CM | POA: Diagnosis not present

## 2019-04-19 DIAGNOSIS — Z96651 Presence of right artificial knee joint: Secondary | ICD-10-CM | POA: Diagnosis not present

## 2019-05-29 DIAGNOSIS — G4733 Obstructive sleep apnea (adult) (pediatric): Secondary | ICD-10-CM | POA: Diagnosis not present

## 2019-05-30 DIAGNOSIS — G4733 Obstructive sleep apnea (adult) (pediatric): Secondary | ICD-10-CM | POA: Diagnosis not present

## 2019-06-01 ENCOUNTER — Encounter (INDEPENDENT_AMBULATORY_CARE_PROVIDER_SITE_OTHER): Payer: Medicare HMO | Admitting: Ophthalmology

## 2019-06-01 DIAGNOSIS — H43813 Vitreous degeneration, bilateral: Secondary | ICD-10-CM

## 2019-06-01 DIAGNOSIS — H35033 Hypertensive retinopathy, bilateral: Secondary | ICD-10-CM | POA: Diagnosis not present

## 2019-06-01 DIAGNOSIS — H35373 Puckering of macula, bilateral: Secondary | ICD-10-CM

## 2019-06-01 DIAGNOSIS — H353132 Nonexudative age-related macular degeneration, bilateral, intermediate dry stage: Secondary | ICD-10-CM

## 2019-06-01 DIAGNOSIS — H33302 Unspecified retinal break, left eye: Secondary | ICD-10-CM | POA: Diagnosis not present

## 2019-06-01 DIAGNOSIS — I10 Essential (primary) hypertension: Secondary | ICD-10-CM | POA: Diagnosis not present

## 2019-06-08 ENCOUNTER — Ambulatory Visit: Payer: Medicare HMO | Attending: Internal Medicine

## 2019-06-08 DIAGNOSIS — Z23 Encounter for immunization: Secondary | ICD-10-CM | POA: Insufficient documentation

## 2019-06-08 NOTE — Progress Notes (Signed)
   Covid-19 Vaccination Clinic  Name:  Bruce [name]    MRN: [id number] DOB: 01/29/1936  06/08/2019  [name] was observed post Covid-19 immunization for 15 minutes without incidence. He was provided with Vaccine Information Sheet and instruction to access the V-Safe system.   [name] was instructed to call 911 with any severe reactions post vaccine: Marland Kitchen Difficulty breathing  . Swelling of your face and throat  . A fast heartbeat  . A bad rash all over your body  . Dizziness and weakness    Immunizations Administered    Name Date Dose VIS Date Route   Pfizer COVID-19 Vaccine 06/08/2019 11:33 AM 0.3 mL 04/28/2019 Intramuscular   Manufacturer: [place]   Lot: [id number]   [place]: [id number]

## 2019-06-12 DIAGNOSIS — R899 Unspecified abnormal finding in specimens from other organs, systems and tissues: Secondary | ICD-10-CM | POA: Diagnosis not present

## 2019-06-28 ENCOUNTER — Ambulatory Visit: Payer: Medicare HMO

## 2019-06-29 ENCOUNTER — Ambulatory Visit: Payer: Medicare HMO | Attending: Internal Medicine

## 2019-06-29 DIAGNOSIS — Z23 Encounter for immunization: Secondary | ICD-10-CM

## 2019-06-29 NOTE — Progress Notes (Signed)
   Covid-19 Vaccination Clinic  Name:  Bruce [name]    MRN: [id number] DOB: Jul 27, 1935  06/29/2019  [name] was observed post Covid-19 immunization for 15 minutes without incidence. He was provided with Vaccine Information Sheet and instruction to access the V-Safe system.   [name] was instructed to call 911 with any severe reactions post vaccine: Marland Kitchen Difficulty breathing  . Swelling of your face and throat  . A fast heartbeat  . A bad rash all over your body  . Dizziness and weakness    Immunizations Administered    Name Date Dose VIS Date Route   Pfizer COVID-19 Vaccine 06/29/2019 11:03 AM 0.3 mL 04/28/2019 Intramuscular   Manufacturer: [place]   Lot: [id number]   [place]: [id number]

## 2019-08-28 DIAGNOSIS — G4733 Obstructive sleep apnea (adult) (pediatric): Secondary | ICD-10-CM | POA: Diagnosis not present

## 2019-08-29 ENCOUNTER — Other Ambulatory Visit: Payer: Self-pay

## 2019-08-29 ENCOUNTER — Ambulatory Visit: Payer: Medicare HMO | Admitting: Cardiology

## 2019-08-29 ENCOUNTER — Encounter: Payer: Self-pay | Admitting: Cardiology

## 2019-08-29 VITALS — BP 126/86 | HR 85 | Ht 65.0 in | Wt 179.8 lb

## 2019-08-29 DIAGNOSIS — I10 Essential (primary) hypertension: Secondary | ICD-10-CM

## 2019-08-29 DIAGNOSIS — I2510 Atherosclerotic heart disease of native coronary artery without angina pectoris: Secondary | ICD-10-CM | POA: Diagnosis not present

## 2019-08-29 DIAGNOSIS — I77810 Thoracic aortic ectasia: Secondary | ICD-10-CM

## 2019-08-29 NOTE — Progress Notes (Signed)
Cardiology Office Note:    Date:  08/29/2019   ID:  SALIH WARWICK, DOB Feb 11, 1936, MRN [id number]  PCP:  Hulan Fess, MD  Cardiologist:  Candee Furbish, MD  Electrophysiologist:  None   Referring MD: Hulan Fess, MD     History of Present Illness:    Bruce [name] is a 84 y.o. male here for the follow-up of coronary artery disease.  Overall doing fairly well, no high risk symptoms no anginal symptoms no fevers chills nausea vomiting syncope.  RCA stent 2002.  June 2019 stress test low risk.  Aortic root upper normal 37 mm.  Overall feels good.  Past Medical History:  Diagnosis Date  . Arthritis   . Coronary artery disease    ETT-Myoview EF 51%, inf infarct, no ischemia, low risk  . History of echocardiogram    Echo 3/17: EF 55-60%, inferior HK, grade 1 diastolic dysfunctione  . History of kidney stones   . Hypertension   . Macular degeneration   . Myocardial infarction ([place]) 2000   with stent  . PONV (postoperative nausea and vomiting)   . Sleep apnea    cpap in use     Past Surgical History:  Procedure Laterality Date  . CORONARY ANGIOPLASTY    . EYE SURGERY     bilateral cataract surgery with lens implant  . KNEE SURGERY Right   . LEFT HEART CATHETERIZATION WITH CORONARY ANGIOGRAM N/A 07/01/2011   Procedure: LEFT HEART CATHETERIZATION WITH CORONARY ANGIOGRAM;  Surgeon: Candee Furbish, MD;  Location: [facility] CATH LAB;  Service: Cardiovascular;  Laterality: N/A;  . left wrist surgery     left wrist fracture  . PARTIAL KNEE ARTHROPLASTY Left 08/02/2017   Procedure: LEFT UNICOMPARTMENTAL KNEE;  Surgeon: Paralee Cancel, MD;  Location: [facility] ORS;  Service: Orthopedics;  Laterality: Left;  . PARTIAL KNEE ARTHROPLASTY Right 03/02/2019   Procedure: UNICOMPARTMENTAL KNEE Medially;  Surgeon: Paralee Cancel, MD;  Location: [facility] ORS;  Service: Orthopedics;  Laterality: Right;  90 mins    Current Medications: Current Meds  Medication Sig  . atorvastatin (LIPITOR) 80 MG tablet Take 80  mg by mouth daily.    . beta carotene w/minerals (OCUVITE) tablet Take 1 tablet by mouth daily.  Marland Kitchen doxazosin (CARDURA) 8 MG tablet Take 8 mg by mouth at bedtime.    . Multiple Vitamin (MULTIVITAMIN WITH MINERALS) TABS tablet Take 1 tablet by mouth daily.  Marland Kitchen omeprazole (PRILOSEC) 40 MG capsule Take 40 mg by mouth daily.  . quinapril (ACCUPRIL) 40 MG tablet TAKE 1 TABLET (40 MG TOTAL) BY MOUTH AT BEDTIME.     Allergies:   Aldactone [spironolactone], Aldomet [methyldopa], and Plavix [clopidogrel bisulfate]   Social History   Socioeconomic History  . Marital status: Married    Spouse name: Not on file  . Number of children: Not on file  . Years of education: Not on file  . Highest education level: Not on file  Occupational History  . Not on file  Tobacco Use  . Smoking status: Never Smoker  . Smokeless tobacco: Never Used  Substance and Sexual Activity  . Alcohol use: No  . Drug use: No  . Sexual activity: Yes  Other Topics Concern  . Not on file  Social History Narrative  . Not on file   Social Determinants of Health   Financial Resource Strain:   . Difficulty of Paying Living Expenses:   Food Insecurity:   . Worried About Charity fundraiser in the Last Year:   .  Ran Out of Food in the Last Year:   Transportation Needs:   . Film/video editor (Medical):   Marland Kitchen Lack of Transportation (Non-Medical):   Physical Activity:   . Days of Exercise per Week:   . Minutes of Exercise per Session:   Stress:   . Feeling of Stress :   Social Connections:   . Frequency of Communication with Friends and Family:   . Frequency of Social Gatherings with Friends and Family:   . Attends Religious Services:   . Active Member of Clubs or Organizations:   . Attends Archivist Meetings:   Marland Kitchen Marital Status:      Family History: The patient's family history includes Coronary artery disease in his brother and father; Diabetes in his brother; Heart attack in his sister; Heart  disease in his brother; Hyperlipidemia in his sister; Kidney disease in his sister.   EKG:  EKG is  ordered today.  The ekg ordered today demonstrates sinus rhythm 85 with nonspecific ST-T wave changes  Recent Labs: 03/03/2019: BUN 14; Creatinine, Ser 1.27; Hemoglobin 11.5; Platelets 139; Potassium 4.1; Sodium 135  Recent Lipid Panel    Component Value Date/Time   CHOL  04/30/2009 0535    135        ATP III CLASSIFICATION:  <200     mg/dL   Desirable  200-239  mg/dL   Borderline High  >=240    mg/dL   High          TRIG 70 04/30/2009 0535   HDL 43 04/30/2009 0535   CHOLHDL 3.1 04/30/2009 0535   VLDL 14 04/30/2009 0535   LDLCALC  04/30/2009 0535    78        Total Cholesterol/HDL:CHD Risk Coronary Heart Disease Risk Table                     Men   Women  1/2 Average Risk   3.4   3.3  Average Risk       5.0   4.4  2 X Average Risk   9.6   7.1  3 X Average Risk  23.4   11.0        Use the calculated Patient Ratio above and the CHD Risk Table to determine the patient's CHD Risk.        ATP III CLASSIFICATION (LDL):  <100     mg/dL   Optimal  100-129  mg/dL   Near or Above                    Optimal  130-159  mg/dL   Borderline  160-189  mg/dL   High  >190     mg/dL   Very High    Physical Exam:    VS:  BP 126/86   Pulse 85   Ht 5\' 5"  (1.651 m)   Wt 179 lb 12.8 oz (81.6 kg)   SpO2 95%   BMI 29.92 kg/m     Wt Readings from Last 3 Encounters:  08/29/19 179 lb 12.8 oz (81.6 kg)  03/02/19 177 lb 12.8 oz (80.7 kg)  02/21/19 177 lb 12.8 oz (80.6 kg)     GEN:  Well nourished, well developed in no acute distress HEENT: Normal NECK: No JVD; No carotid bruits LYMPHATICS: No lymphadenopathy CARDIAC: RRR, no murmurs, rubs, gallops RESPIRATORY:  Clear to auscultation without rales, wheezing or rhonchi  ABDOMEN: Soft, non-tender, non-distended MUSCULOSKELETAL:  No edema; No deformity  SKIN: Warm and dry NEUROLOGIC:  Alert and oriented x 3 PSYCHIATRIC:  Normal affect    ASSESSMENT:    1. Dilated aortic root (HCC)   2. Essential hypertension   3. Essential hypertension, benign   4. ASCVD (arteriosclerotic cardiovascular disease)    PLAN:    In order of problems listed above:  Coronary artery disease -RCA stent in 2002.  Stress test in 2019 overall low risk.  Secondary risk factor prevention.  Palpitations -Both PVCs and paroxysmal atrial tachycardia noted on prior event monitor.  No atrial fibrillation.  Could always use Toprol if necessary.  Not interested at this time.  Hyperlipidemia -Prior LDL 73.  Continue with atorvastatin, dietary modification.  High intensity dose.  Essential hypertension -No evidence of subclavian stenosis on prior Doppler analysis.  There has been a discrepancy in the past between right and left arm pressures.  Continue with multidrug regimen.   Medication Adjustments/Labs and Tests Ordered: Current medicines are reviewed at length with the patient today.  Concerns regarding medicines are outlined above.  Orders Placed This Encounter  Procedures  . EKG 12-Lead   No orders of the defined types were placed in this encounter.   Patient Instructions  Your physician recommends that you continue on your current medications as directed. Please refer to the Current Medication list given to you today.   Your physician wants you to follow-up in: [place] will receive a reminder letter in the mail two months in advance. If you don't receive a letter, please call our office to schedule the follow-up appointment.     Signed, Candee Furbish, MD  08/29/2019 2:51 PM    [facility]

## 2019-08-29 NOTE — Patient Instructions (Signed)
Your physician recommends that you continue on your current medications as directed. Please refer to the Current Medication list given to you today.  Your physician wants you to follow-up in: YEAR WITH DR SKAINS  You will receive a reminder letter in the mail two months in advance. If you don't receive a letter, please call our office to schedule the follow-up appointment.  

## 2019-09-04 DIAGNOSIS — N183 Chronic kidney disease, stage 3 unspecified: Secondary | ICD-10-CM | POA: Diagnosis not present

## 2019-09-04 DIAGNOSIS — N189 Chronic kidney disease, unspecified: Secondary | ICD-10-CM | POA: Diagnosis not present

## 2019-09-14 DIAGNOSIS — N1831 Chronic kidney disease, stage 3a: Secondary | ICD-10-CM | POA: Diagnosis not present

## 2019-09-14 DIAGNOSIS — D631 Anemia in chronic kidney disease: Secondary | ICD-10-CM | POA: Diagnosis not present

## 2019-09-14 DIAGNOSIS — I129 Hypertensive chronic kidney disease with stage 1 through stage 4 chronic kidney disease, or unspecified chronic kidney disease: Secondary | ICD-10-CM | POA: Diagnosis not present

## 2019-09-14 DIAGNOSIS — N281 Cyst of kidney, acquired: Secondary | ICD-10-CM | POA: Diagnosis not present

## 2019-09-18 ENCOUNTER — Other Ambulatory Visit: Payer: Self-pay | Admitting: Nephrology

## 2019-09-18 DIAGNOSIS — N1831 Chronic kidney disease, stage 3a: Secondary | ICD-10-CM

## 2019-09-18 DIAGNOSIS — N281 Cyst of kidney, acquired: Secondary | ICD-10-CM

## 2019-09-20 ENCOUNTER — Ambulatory Visit
Admission: RE | Admit: 2019-09-20 | Discharge: 2019-09-20 | Disposition: A | Discharge status: Home / Self Care | Payer: Medicare HMO | Source: Ambulatory Visit | Attending: Nephrology | Admitting: Nephrology

## 2019-09-20 DIAGNOSIS — N281 Cyst of kidney, acquired: Secondary | ICD-10-CM | POA: Diagnosis not present

## 2019-09-20 DIAGNOSIS — N1831 Chronic kidney disease, stage 3a: Secondary | ICD-10-CM

## 2019-11-27 DIAGNOSIS — G4733 Obstructive sleep apnea (adult) (pediatric): Secondary | ICD-10-CM | POA: Diagnosis not present

## 2019-12-26 DIAGNOSIS — G4733 Obstructive sleep apnea (adult) (pediatric): Secondary | ICD-10-CM | POA: Diagnosis not present

## 2020-01-26 ENCOUNTER — Other Ambulatory Visit: Payer: Self-pay | Admitting: Cardiology

## 2020-03-07 DIAGNOSIS — I25119 Atherosclerotic heart disease of native coronary artery with unspecified angina pectoris: Secondary | ICD-10-CM | POA: Diagnosis not present

## 2020-03-07 DIAGNOSIS — Z0000 Encounter for general adult medical examination without abnormal findings: Secondary | ICD-10-CM | POA: Diagnosis not present

## 2020-03-07 DIAGNOSIS — N183 Chronic kidney disease, stage 3 unspecified: Secondary | ICD-10-CM | POA: Diagnosis not present

## 2020-03-07 DIAGNOSIS — I10 Essential (primary) hypertension: Secondary | ICD-10-CM | POA: Diagnosis not present

## 2020-03-07 DIAGNOSIS — E7800 Pure hypercholesterolemia, unspecified: Secondary | ICD-10-CM | POA: Diagnosis not present

## 2020-03-07 DIAGNOSIS — Z23 Encounter for immunization: Secondary | ICD-10-CM | POA: Diagnosis not present

## 2020-03-07 DIAGNOSIS — R7303 Prediabetes: Secondary | ICD-10-CM | POA: Diagnosis not present

## 2020-03-07 DIAGNOSIS — G4733 Obstructive sleep apnea (adult) (pediatric): Secondary | ICD-10-CM | POA: Diagnosis not present

## 2020-03-07 DIAGNOSIS — N400 Enlarged prostate without lower urinary tract symptoms: Secondary | ICD-10-CM | POA: Diagnosis not present

## 2020-03-19 DIAGNOSIS — B078 Other viral warts: Secondary | ICD-10-CM | POA: Diagnosis not present

## 2020-03-19 DIAGNOSIS — L570 Actinic keratosis: Secondary | ICD-10-CM | POA: Diagnosis not present

## 2020-03-19 DIAGNOSIS — D225 Melanocytic nevi of trunk: Secondary | ICD-10-CM | POA: Diagnosis not present

## 2020-03-19 DIAGNOSIS — L820 Inflamed seborrheic keratosis: Secondary | ICD-10-CM | POA: Diagnosis not present

## 2020-03-19 DIAGNOSIS — X32XXXD Exposure to sunlight, subsequent encounter: Secondary | ICD-10-CM | POA: Diagnosis not present

## 2020-03-19 DIAGNOSIS — L821 Other seborrheic keratosis: Secondary | ICD-10-CM | POA: Diagnosis not present

## 2020-03-26 DIAGNOSIS — G4733 Obstructive sleep apnea (adult) (pediatric): Secondary | ICD-10-CM | POA: Diagnosis not present

## 2020-05-17 DIAGNOSIS — J988 Other specified respiratory disorders: Secondary | ICD-10-CM | POA: Diagnosis not present

## 2020-05-17 DIAGNOSIS — J101 Influenza due to other identified influenza virus with other respiratory manifestations: Secondary | ICD-10-CM | POA: Diagnosis not present

## 2020-05-17 DIAGNOSIS — R059 Cough, unspecified: Secondary | ICD-10-CM | POA: Diagnosis not present

## 2020-05-17 DIAGNOSIS — Z03818 Encounter for observation for suspected exposure to other biological agents ruled out: Secondary | ICD-10-CM | POA: Diagnosis not present

## 2020-05-31 ENCOUNTER — Encounter (INDEPENDENT_AMBULATORY_CARE_PROVIDER_SITE_OTHER): Payer: Medicare HMO | Admitting: Ophthalmology

## 2020-05-31 ENCOUNTER — Other Ambulatory Visit: Payer: Self-pay

## 2020-05-31 DIAGNOSIS — H33302 Unspecified retinal break, left eye: Secondary | ICD-10-CM

## 2020-05-31 DIAGNOSIS — I10 Essential (primary) hypertension: Secondary | ICD-10-CM | POA: Diagnosis not present

## 2020-05-31 DIAGNOSIS — H35373 Puckering of macula, bilateral: Secondary | ICD-10-CM | POA: Diagnosis not present

## 2020-05-31 DIAGNOSIS — H43813 Vitreous degeneration, bilateral: Secondary | ICD-10-CM | POA: Diagnosis not present

## 2020-05-31 DIAGNOSIS — H353132 Nonexudative age-related macular degeneration, bilateral, intermediate dry stage: Secondary | ICD-10-CM | POA: Diagnosis not present

## 2020-05-31 DIAGNOSIS — H35033 Hypertensive retinopathy, bilateral: Secondary | ICD-10-CM

## 2020-06-05 DIAGNOSIS — G4733 Obstructive sleep apnea (adult) (pediatric): Secondary | ICD-10-CM | POA: Diagnosis not present

## 2020-06-22 ENCOUNTER — Other Ambulatory Visit: Payer: Self-pay | Admitting: Cardiology

## 2020-06-25 DIAGNOSIS — G4733 Obstructive sleep apnea (adult) (pediatric): Secondary | ICD-10-CM | POA: Diagnosis not present

## 2020-08-02 DIAGNOSIS — C44529 Squamous cell carcinoma of skin of other part of trunk: Secondary | ICD-10-CM | POA: Diagnosis not present

## 2020-08-02 DIAGNOSIS — C44311 Basal cell carcinoma of skin of nose: Secondary | ICD-10-CM | POA: Diagnosis not present

## 2020-08-02 DIAGNOSIS — D045 Carcinoma in situ of skin of trunk: Secondary | ICD-10-CM | POA: Diagnosis not present

## 2020-09-09 DIAGNOSIS — L089 Local infection of the skin and subcutaneous tissue, unspecified: Secondary | ICD-10-CM | POA: Diagnosis not present

## 2020-09-09 DIAGNOSIS — W57XXXA Bitten or stung by nonvenomous insect and other nonvenomous arthropods, initial encounter: Secondary | ICD-10-CM | POA: Diagnosis not present

## 2020-09-09 DIAGNOSIS — S80861A Insect bite (nonvenomous), right lower leg, initial encounter: Secondary | ICD-10-CM | POA: Diagnosis not present

## 2020-09-13 DIAGNOSIS — C44622 Squamous cell carcinoma of skin of right upper limb, including shoulder: Secondary | ICD-10-CM | POA: Diagnosis not present

## 2020-09-13 DIAGNOSIS — Z08 Encounter for follow-up examination after completed treatment for malignant neoplasm: Secondary | ICD-10-CM | POA: Diagnosis not present

## 2020-09-13 DIAGNOSIS — Z85828 Personal history of other malignant neoplasm of skin: Secondary | ICD-10-CM | POA: Diagnosis not present

## 2020-09-24 DIAGNOSIS — G4733 Obstructive sleep apnea (adult) (pediatric): Secondary | ICD-10-CM | POA: Diagnosis not present

## 2020-09-25 ENCOUNTER — Ambulatory Visit: Payer: Medicare HMO | Admitting: Cardiology

## 2020-09-25 ENCOUNTER — Other Ambulatory Visit: Payer: Self-pay

## 2020-09-25 ENCOUNTER — Encounter: Payer: Self-pay | Admitting: Cardiology

## 2020-09-25 VITALS — BP 100/60 | HR 81 | Ht 65.0 in | Wt 171.0 lb

## 2020-09-25 DIAGNOSIS — I77810 Thoracic aortic ectasia: Secondary | ICD-10-CM | POA: Diagnosis not present

## 2020-09-25 DIAGNOSIS — I2510 Atherosclerotic heart disease of native coronary artery without angina pectoris: Secondary | ICD-10-CM | POA: Diagnosis not present

## 2020-09-25 NOTE — Patient Instructions (Signed)
Medication Instructions:  Your physician recommends that you continue on your current medications as directed. Please refer to the Current Medication list given to you today.  *If you need a refill on your cardiac medications before your next appointment, please call your pharmacy*   Lab Work: none If you have labs (blood work) drawn today and your tests are completely normal, you will receive your results only by: Marland Kitchen MyChart Message (if you have MyChart) OR . A paper copy in the mail If you have any lab test that is abnormal or we need to change your treatment, we will call you to review the results.   Testing/Procedures: none   Follow-Up: At [facility], you and your health needs are our priority.  As part of our continuing mission to provide you with exceptional heart care, we have created designated Provider Care Teams.  These Care Teams include your primary Cardiologist (physician) and Advanced Practice Providers (APPs -  Physician Assistants and Nurse Practitioners) who all work together to provide you with the care you need, when you need it.  We recommend signing up for the patient portal called "MyChart".  Sign up information is provided on this After Visit Summary.  MyChart is used to connect with patients for Virtual Visits (Telemedicine).  Patients are able to view lab/test results, encounter notes, upcoming appointments, etc.  Non-urgent messages can be sent to your provider as well.   To learn more about what you can do with MyChart, go to [email or web address].    Your next appointment:   1 year(s)  The format for your next appointment:   In Person  Provider:   Candee Furbish, MD   Other Instructions

## 2020-09-25 NOTE — Progress Notes (Signed)
Cardiology Office Note:    Date:  09/25/2020   ID:  Bruce [name], DOB 1936-04-22, MRN [id number]  PCP:  Vernie Shanks, MD   [facility] Providers Cardiologist:  Candee Furbish, MD     Referring MD: Hulan Fess, MD    History of Present Illness:    Bruce [name] is a 85 y.o. male here for the follow-up of coronary artery disease with prior RCA stent in 2002.  In June 2019 nuclear stress test was performed and was low risk.  He also has aortic root dilatation upper normal 37 mm.  Doing well.  He had amlodipine added for his blood pressure control.  Rechecked again and his blood pressure was 115/64.  Excellent control.  He will watch for any signs of dizziness when standing up.  No chest pain.  No shortness of breath.  Past Medical History:  Diagnosis Date  . Arthritis   . Coronary artery disease    ETT-Myoview EF 51%, inf infarct, no ischemia, low risk  . History of echocardiogram    Echo 3/17: EF 55-60%, inferior HK, grade 1 diastolic dysfunctione  . History of kidney stones   . Hypertension   . Macular degeneration   . Myocardial infarction ([place]) 2000   with stent  . PONV (postoperative nausea and vomiting)   . Sleep apnea    cpap in use     Past Surgical History:  Procedure Laterality Date  . CORONARY ANGIOPLASTY    . EYE SURGERY     bilateral cataract surgery with lens implant  . KNEE SURGERY Right   . LEFT HEART CATHETERIZATION WITH CORONARY ANGIOGRAM N/A 07/01/2011   Procedure: LEFT HEART CATHETERIZATION WITH CORONARY ANGIOGRAM;  Surgeon: Candee Furbish, MD;  Location: [facility] CATH LAB;  Service: Cardiovascular;  Laterality: N/A;  . left wrist surgery     left wrist fracture  . PARTIAL KNEE ARTHROPLASTY Left 08/02/2017   Procedure: LEFT UNICOMPARTMENTAL KNEE;  Surgeon: Paralee Cancel, MD;  Location: [facility] ORS;  Service: Orthopedics;  Laterality: Left;  . PARTIAL KNEE ARTHROPLASTY Right 03/02/2019   Procedure: UNICOMPARTMENTAL KNEE Medially;  Surgeon: Paralee Cancel, MD;   Location: [facility] ORS;  Service: Orthopedics;  Laterality: Right;  90 mins    Current Medications: Current Meds  Medication Sig  . amLODipine (NORVASC) 10 MG tablet Take 10 mg by mouth daily.  Marland Kitchen amoxicillin (AMOXIL) 500 MG capsule amoxicillin 500 mg capsule  TAKE 4 CAPSULES BY MOUTH 1 HOUR PRIOR TO DENTAL PROCEDURE  . aspirin 81 MG chewable tablet aspirin 81 mg chewable tablet  Chew 1 tablet every day by oral route.  Marland Kitchen atorvastatin (LIPITOR) 80 MG tablet Take 80 mg by mouth daily.  . beta carotene w/minerals (OCUVITE) tablet Take 1 tablet by mouth daily.  Marland Kitchen doxazosin (CARDURA) 8 MG tablet Take 8 mg by mouth at bedtime.  . ferrous sulfate (FERROUSUL) 325 (65 FE) MG tablet Take 1 tablet (325 mg total) by mouth 3 (three) times daily with meals for 14 days.  . Multiple Vitamin (MULTIVITAMIN WITH MINERALS) TABS tablet Take 1 tablet by mouth daily.  Marland Kitchen omeprazole (PRILOSEC) 40 MG capsule Take 40 mg by mouth daily.  . quinapril (ACCUPRIL) 40 MG tablet TAKE 1 TABLET (40 MG TOTAL) BY MOUTH AT BEDTIME.     Allergies:   Aldactone [spironolactone], Aldomet [methyldopa], and Plavix [clopidogrel bisulfate]   Social History   Socioeconomic History  . Marital status: Married    Spouse name: Not on file  . Number  of children: Not on file  . Years of education: Not on file  . Highest education level: Not on file  Occupational History  . Not on file  Tobacco Use  . Smoking status: Never Smoker  . Smokeless tobacco: Never Used  Vaping Use  . Vaping Use: Never used  Substance and Sexual Activity  . Alcohol use: No  . Drug use: No  . Sexual activity: Yes  Other Topics Concern  . Not on file  Social History Narrative  . Not on file   Social Determinants of Health   Financial Resource Strain: Not on file  Food Insecurity: Not on file  Transportation Needs: Not on file  Physical Activity: Not on file  Stress: Not on file  Social Connections: Not on file     Family History: The patient's  family history includes Coronary artery disease in his brother and father; Diabetes in his brother; Heart attack in his sister; Heart disease in his brother; Hyperlipidemia in his sister; Kidney disease in his sister.  ROS:   Please see the history of present illness.     All other systems reviewed and are negative.  EKGs/Labs/Other Studies Reviewed:    The following studies were reviewed today:  ECHO 2017:  Conclusions   - Left ventricle: The cavity size was normal. Wall thickness was  normal. Systolic function was normal. The estimated ejection  fraction was in the range of 55% to 60%. There is hypokinesis of  the basalinferior myocardium. Doppler parameters are consistent  with abnormal left ventricular relaxation (grade 1 diastolic  dysfunction).   Impressions:   - Hypokinesis of the basal inferior wall with overall preserved LV  systolic function; grade 1 diastolic dysfunction; trace MR and  TR.   EKG:  EKG is  ordered today.  The ekg ordered today demonstrates sinus rhythm 81 no other abnormalities  Recent Labs: No results found for requested labs within last 8760 hours.  Recent Lipid Panel    Component Value Date/Time   CHOL  04/30/2009 0535    135        ATP III CLASSIFICATION:  <200     mg/dL   Desirable  200-239  mg/dL   Borderline High  >=240    mg/dL   High          TRIG 70 04/30/2009 0535   HDL 43 04/30/2009 0535   CHOLHDL 3.1 04/30/2009 0535   VLDL 14 04/30/2009 0535   LDLCALC  04/30/2009 0535    78        Total Cholesterol/HDL:CHD Risk Coronary Heart Disease Risk Table                     Men   Women  1/2 Average Risk   3.4   3.3  Average Risk       5.0   4.4  2 X Average Risk   9.6   7.1  3 X Average Risk  23.4   11.0        Use the calculated Patient Ratio above and the CHD Risk Table to determine the patient's CHD Risk.        ATP III CLASSIFICATION (LDL):  <100     mg/dL   Optimal  100-129  mg/dL   Near or Above                     Optimal  130-159  mg/dL   Borderline  160-189  mg/dL   High  >190     mg/dL   Very High     Risk Assessment/Calculations:      Physical Exam:    VS:  BP 100/60 (BP Location: Left Arm, Patient Position: Sitting, Cuff Size: Normal)   Pulse 81   Ht 5\' 5"  (1.651 m)   Wt 171 lb (77.6 kg)   SpO2 95%   BMI 28.46 kg/m     Wt Readings from Last 3 Encounters:  09/25/20 171 lb (77.6 kg)  08/29/19 179 lb 12.8 oz (81.6 kg)  03/02/19 177 lb 12.8 oz (80.7 kg)     GEN:  Well nourished, well developed in no acute distress HEENT: Normal NECK: No JVD; No carotid bruits LYMPHATICS: No lymphadenopathy CARDIAC: RRR, no murmurs, rubs, gallops RESPIRATORY:  Clear to auscultation without rales, wheezing or rhonchi  ABDOMEN: Soft, non-tender, non-distended MUSCULOSKELETAL:  No edema; No deformity  SKIN: Warm and dry NEUROLOGIC:  Alert and oriented x 3 PSYCHIATRIC:  Normal affect   ASSESSMENT:    1. Dilated aortic root (HCC)   2. Coronary artery disease involving native coronary artery of native heart without angina pectoris    PLAN:    In order of problems listed above:  Coronary artery disease - Prior RCA stent 2002, stress test 2019 low risk, continue with goal-directed medical therapy.  Aspirin.  High intensity statin.  No myalgias.  Doing well.  No changes made.  Hyperlipidemia - Prior LDL 73 with atorvastatin.  No myalgias on high intensity dose.  Continue with current medical management.  Essential hypertension - Previously no evidence of subclavian stenosis on Doppler analysis.  He has had discrepancies in the past with right and left arm pressures.  Continue with current multidrug regimen. -He is on amlodipine 10 mg as well as quinapril 40 mg.  No changes made.  Continue.  Palpitations - He had a prior event monitor that showed both PVCs and PAT.  If necessary could use Toprol in the future.     Medication Adjustments/Labs and Tests Ordered: Current medicines  are reviewed at length with the patient today.  Concerns regarding medicines are outlined above.  Orders Placed This Encounter  Procedures  . EKG 12-Lead   No orders of the defined types were placed in this encounter.   Patient Instructions  Medication Instructions:  Your physician recommends that you continue on your current medications as directed. Please refer to the Current Medication list given to you today.  *If you need a refill on your cardiac medications before your next appointment, please call your pharmacy*   Lab Work: none If you have labs (blood work) drawn today and your tests are completely normal, you will receive your results only by: Marland Kitchen MyChart Message (if you have MyChart) OR . A paper copy in the mail If you have any lab test that is abnormal or we need to change your treatment, we will call you to review the results.   Testing/Procedures: none   Follow-Up: At [facility], you and your health needs are our priority.  As part of our continuing mission to provide you with exceptional heart care, we have created designated Provider Care Teams.  These Care Teams include your primary Cardiologist (physician) and Advanced Practice Providers (APPs -  Physician Assistants and Nurse Practitioners) who all work together to provide you with the care you need, when you need it.  We recommend signing up for the patient portal called "MyChart".  Sign up information is  provided on this After Visit Summary.  MyChart is used to connect with patients for Virtual Visits (Telemedicine).  Patients are able to view lab/test results, encounter notes, upcoming appointments, etc.  Non-urgent messages can be sent to your provider as well.   To learn more about what you can do with MyChart, go to [email or web address].    Your next appointment:   1 year(s)  The format for your next appointment:   In Person  Provider:   Candee Furbish, MD   Other Instructions       Signed, Candee Furbish, MD  09/25/2020 10:06 AM    [place]

## 2020-10-16 DIAGNOSIS — Z85828 Personal history of other malignant neoplasm of skin: Secondary | ICD-10-CM | POA: Diagnosis not present

## 2020-10-16 DIAGNOSIS — Z08 Encounter for follow-up examination after completed treatment for malignant neoplasm: Secondary | ICD-10-CM | POA: Diagnosis not present

## 2020-11-10 ENCOUNTER — Emergency Department (HOSPITAL_COMMUNITY): Payer: Medicare HMO

## 2020-11-10 ENCOUNTER — Emergency Department (HOSPITAL_COMMUNITY)
Admission: EM | Admit: 2020-11-10 | Discharge: 2020-11-10 | Disposition: A | Discharge status: Home / Self Care | Payer: Medicare HMO | Attending: Emergency Medicine | Admitting: Emergency Medicine

## 2020-11-10 DIAGNOSIS — R0902 Hypoxemia: Secondary | ICD-10-CM | POA: Diagnosis not present

## 2020-11-10 DIAGNOSIS — Z955 Presence of coronary angioplasty implant and graft: Secondary | ICD-10-CM | POA: Diagnosis not present

## 2020-11-10 DIAGNOSIS — R0789 Other chest pain: Secondary | ICD-10-CM | POA: Diagnosis not present

## 2020-11-10 DIAGNOSIS — R079 Chest pain, unspecified: Secondary | ICD-10-CM

## 2020-11-10 DIAGNOSIS — U071 COVID-19: Secondary | ICD-10-CM

## 2020-11-10 DIAGNOSIS — I701 Atherosclerosis of renal artery: Secondary | ICD-10-CM | POA: Diagnosis not present

## 2020-11-10 DIAGNOSIS — I10 Essential (primary) hypertension: Secondary | ICD-10-CM | POA: Diagnosis not present

## 2020-11-10 DIAGNOSIS — Z79899 Other long term (current) drug therapy: Secondary | ICD-10-CM | POA: Insufficient documentation

## 2020-11-10 DIAGNOSIS — R42 Dizziness and giddiness: Secondary | ICD-10-CM | POA: Diagnosis not present

## 2020-11-10 DIAGNOSIS — I2510 Atherosclerotic heart disease of native coronary artery without angina pectoris: Secondary | ICD-10-CM | POA: Diagnosis not present

## 2020-11-10 DIAGNOSIS — Z96653 Presence of artificial knee joint, bilateral: Secondary | ICD-10-CM | POA: Diagnosis not present

## 2020-11-10 DIAGNOSIS — N289 Disorder of kidney and ureter, unspecified: Secondary | ICD-10-CM | POA: Diagnosis not present

## 2020-11-10 DIAGNOSIS — R109 Unspecified abdominal pain: Secondary | ICD-10-CM | POA: Diagnosis not present

## 2020-11-10 DIAGNOSIS — N2889 Other specified disorders of kidney and ureter: Secondary | ICD-10-CM | POA: Diagnosis not present

## 2020-11-10 DIAGNOSIS — Z7982 Long term (current) use of aspirin: Secondary | ICD-10-CM | POA: Insufficient documentation

## 2020-11-10 DIAGNOSIS — Q614 Renal dysplasia: Secondary | ICD-10-CM | POA: Diagnosis not present

## 2020-11-10 DIAGNOSIS — I7773 Dissection of renal artery: Secondary | ICD-10-CM | POA: Diagnosis not present

## 2020-11-10 DIAGNOSIS — K449 Diaphragmatic hernia without obstruction or gangrene: Secondary | ICD-10-CM | POA: Diagnosis not present

## 2020-11-10 DIAGNOSIS — I773 Arterial fibromuscular dysplasia: Secondary | ICD-10-CM | POA: Diagnosis not present

## 2020-11-10 DIAGNOSIS — I7100 Dissection of unspecified site of aorta: Secondary | ICD-10-CM | POA: Diagnosis not present

## 2020-11-10 LAB — RESP PANEL BY RT-PCR (FLU A&B, COVID) ARPGX2
Influenza A by PCR: NEGATIVE
Influenza B by PCR: NEGATIVE
SARS Coronavirus 2 by RT PCR: POSITIVE — AB

## 2020-11-10 LAB — CBC
HCT: 37.5 % — ABNORMAL LOW (ref 39.0–52.0)
Hemoglobin: 12.5 g/dL — ABNORMAL LOW (ref 13.0–17.0)
MCH: 31.7 pg (ref 26.0–34.0)
MCHC: 33.3 g/dL (ref 30.0–36.0)
MCV: 95.2 fL (ref 80.0–100.0)
Platelets: 141 10*3/uL — ABNORMAL LOW (ref 150–400)
RBC: 3.94 MIL/uL — ABNORMAL LOW (ref 4.22–5.81)
RDW: 14 % (ref 11.5–15.5)
WBC: 8.3 10*3/uL (ref 4.0–10.5)
nRBC: 0 % (ref 0.0–0.2)

## 2020-11-10 LAB — LIPASE, BLOOD: Lipase: 32 U/L (ref 11–51)

## 2020-11-10 LAB — HEPATIC FUNCTION PANEL
ALT: 27 U/L (ref 0–44)
AST: 32 U/L (ref 15–41)
Albumin: 3.2 g/dL — ABNORMAL LOW (ref 3.5–5.0)
Alkaline Phosphatase: 108 U/L (ref 38–126)
Bilirubin, Direct: 0.1 mg/dL (ref 0.0–0.2)
Indirect Bilirubin: 0.3 mg/dL (ref 0.3–0.9)
Total Bilirubin: 0.4 mg/dL (ref 0.3–1.2)
Total Protein: 5.6 g/dL — ABNORMAL LOW (ref 6.5–8.1)

## 2020-11-10 LAB — BASIC METABOLIC PANEL
Anion gap: 8 (ref 5–15)
BUN: 15 mg/dL (ref 8–23)
CO2: 21 mmol/L — ABNORMAL LOW (ref 22–32)
Calcium: 8.1 mg/dL — ABNORMAL LOW (ref 8.9–10.3)
Chloride: 107 mmol/L (ref 98–111)
Creatinine, Ser: 1.34 mg/dL — ABNORMAL HIGH (ref 0.61–1.24)
GFR, Estimated: 53 mL/min — ABNORMAL LOW (ref >60)
Glucose, Bld: 140 mg/dL — ABNORMAL HIGH (ref 70–99)
Potassium: 4 mmol/L (ref 3.5–5.1)
Sodium: 136 mmol/L (ref 135–145)

## 2020-11-10 LAB — TROPONIN I (HIGH SENSITIVITY)
Troponin I (High Sensitivity): 9 ng/L (ref <18)
Troponin I (High Sensitivity): 9 ng/L (ref <18)

## 2020-11-10 MED ORDER — IOHEXOL 350 MG/ML SOLN
100.0000 mL | Freq: Once | INTRAVENOUS | Status: AC | PRN
  Administered 2020-11-10: 100 mL via INTRAVENOUS

## 2020-11-10 NOTE — ED Provider Notes (Signed)
[place] EMERGENCY DEPARTMENT Provider Note   CSN: [id number] Arrival date & time: 11/10/20  0123     History Chief Complaint  Patient presents with   Chest Pain    Covid+    Bruce [name] is a 85 y.o. male.  85 year old male who presents emerged part for chest pain.  He has history of coronary artery disease status post stent in 2001.  Sounds like he has had some stress test and catheterization since then without any further interventions.  Patient states that tonight he was started to have right sided chest pain and ankle and ribs.  This is sharp and pretty severe in nature.  Is unlike things that he had felt before.  Shortly after that it started radiating more towards the middle and then the left side and then he vomited and then all the discomfort went away.  He has been asymptomatic since then.  He had aspirin with EMS.  He states that he has not had any recent fevers or cough or any other illnesses.  He has had no diarrhea or constipation.  No gallbladder issues.    Chest Pain     Past Medical History:  Diagnosis Date   Arthritis    Coronary artery disease    ETT-Myoview EF 51%, inf infarct, no ischemia, low risk   History of echocardiogram    Echo 3/17: EF 55-60%, inferior HK, grade 1 diastolic dysfunctione   History of kidney stones    Hypertension    Macular degeneration    Myocardial infarction ([place]) 2000   with stent   PONV (postoperative nausea and vomiting)    Sleep apnea    cpap in use     Patient Active Problem List   Diagnosis Date Noted   Overweight (BMI 25.0-29.9) 03/03/2019   S/P right unicompartmental knee replacement 03/02/2019   Status post right unicompartmental knee replacement 03/02/2019   S/P left unicompartmental knee replacement 08/02/2017   Dilated aortic root ([place]) 01/11/2014   Essential hypertension, benign 01/11/2014   Vertigo 01/11/2014   ASCVD (arteriosclerotic cardiovascular disease) 07/15/2013   Essential  hypertension 07/15/2013   Erectile dysfunction 07/15/2013   Dyslipidemia 07/15/2013   Peptic ulcer disease 07/15/2013    Past Surgical History:  Procedure Laterality Date   CORONARY ANGIOPLASTY     EYE SURGERY     bilateral cataract surgery with lens implant   KNEE SURGERY Right    LEFT HEART CATHETERIZATION WITH CORONARY ANGIOGRAM N/A 07/01/2011   Procedure: LEFT HEART CATHETERIZATION WITH CORONARY ANGIOGRAM;  Surgeon: Candee Furbish, MD;  Location: [facility] CATH LAB;  Service: Cardiovascular;  Laterality: N/A;   left wrist surgery     left wrist fracture   PARTIAL KNEE ARTHROPLASTY Left 08/02/2017   Procedure: LEFT UNICOMPARTMENTAL KNEE;  Surgeon: Paralee Cancel, MD;  Location: [facility] ORS;  Service: Orthopedics;  Laterality: Left;   PARTIAL KNEE ARTHROPLASTY Right 03/02/2019   Procedure: UNICOMPARTMENTAL KNEE Medially;  Surgeon: Paralee Cancel, MD;  Location: [facility] ORS;  Service: Orthopedics;  Laterality: Right;  90 mins       Family History  Problem Relation Age of Onset   Coronary artery disease Father    Kidney disease Sister    Hyperlipidemia Sister    Heart disease Brother    Diabetes Brother    Coronary artery disease Brother    Heart attack Sister     Social History   Tobacco Use   Smoking status: Never   Smokeless tobacco: Never  Vaping  Use   Vaping Use: Never used  Substance Use Topics   Alcohol use: No   Drug use: No    Home Medications Prior to Admission medications   Medication Sig Start Date End Date Taking? Authorizing Provider  amLODipine (NORVASC) 10 MG tablet Take 10 mg by mouth daily. 04/24/20   [provider]  amoxicillin (AMOXIL) 500 MG capsule amoxicillin 500 mg capsule  TAKE 4 CAPSULES BY MOUTH 1 HOUR PRIOR TO DENTAL PROCEDURE    [provider]  aspirin 81 MG chewable tablet aspirin 81 mg chewable tablet  Chew 1 tablet every day by oral route.    [provider]  atorvastatin (LIPITOR) 80 MG tablet Take 80 mg by mouth daily.     [provider]  beta carotene w/minerals (OCUVITE) tablet Take 1 tablet by mouth daily.    [provider]  doxazosin (CARDURA) 8 MG tablet Take 8 mg by mouth at bedtime.    [provider]  ferrous sulfate (FERROUSUL) 325 (65 FE) MG tablet Take 1 tablet (325 mg total) by mouth 3 (three) times daily with meals for 14 days. 03/03/19 03/17/19  Danae Orleans, PA-C  Multiple Vitamin (MULTIVITAMIN WITH MINERALS) TABS tablet Take 1 tablet by mouth daily.    [provider]  omeprazole (PRILOSEC) 40 MG capsule Take 40 mg by mouth daily.    [provider]  quinapril (ACCUPRIL) 40 MG tablet TAKE 1 TABLET (40 MG TOTAL) BY MOUTH AT BEDTIME. 01/26/20   Jerline Pain, MD    Allergies    Aldactone [spironolactone], Aldomet [methyldopa], and Plavix [clopidogrel bisulfate]  Review of Systems   Review of Systems  Cardiovascular:  Positive for chest pain.  All other systems reviewed and are negative.  Physical Exam Updated Vital Signs BP 128/78 (BP Location: Right Arm)   Pulse 84   Temp 97.8 F (36.6 C) (Oral)   Resp 16   Ht 5' 5.5" (1.664 m)   Wt 79.4 kg   SpO2 99%   BMI 28.68 kg/m   Physical Exam Vitals and nursing note reviewed.  Constitutional:      Appearance: He is well-developed.  HENT:     Head: Normocephalic and atraumatic.     Nose: No congestion or rhinorrhea.     Mouth/Throat:     Mouth: Mucous membranes are moist.     Pharynx: Oropharynx is clear.  Eyes:     Pupils: Pupils are equal, round, and reactive to light.  Cardiovascular:     Rate and Rhythm: Normal rate.  Pulmonary:     Effort: Pulmonary effort is normal. No respiratory distress.  Chest:     Chest wall: No tenderness.  Abdominal:     General: There is no distension.  Musculoskeletal:        General: Normal range of motion.     Cervical back: Normal range of motion.  Skin:    General: Skin is warm and dry.     Coloration: Skin is not jaundiced or pale.   Neurological:     General: No focal deficit present.     Mental Status: He is alert.    ED Results / Procedures / Treatments   Labs (all labs ordered are listed, but only abnormal results are displayed) Labs Reviewed  RESP PANEL BY RT-PCR (FLU A&B, COVID) ARPGX2 - Abnormal; Notable for the following components:      Result Value   SARS Coronavirus 2 by RT PCR POSITIVE (*)  All other components within normal limits  BASIC METABOLIC PANEL - Abnormal; Notable for the following components:   CO2 21 (*)    Glucose, Bld 140 (*)    Creatinine, Ser 1.34 (*)    Calcium 8.1 (*)    GFR, Estimated 53 (*)    All other components within normal limits  CBC - Abnormal; Notable for the following components:   RBC 3.94 (*)    Hemoglobin 12.5 (*)    HCT 37.5 (*)    Platelets 141 (*)    All other components within normal limits  HEPATIC FUNCTION PANEL - Abnormal; Notable for the following components:   Total Protein 5.6 (*)    Albumin 3.2 (*)    All other components within normal limits  LIPASE, BLOOD  TROPONIN I (HIGH SENSITIVITY)  TROPONIN I (HIGH SENSITIVITY)    EKG EKG Interpretation  Date/Time:  Sunday November 10 2020 01:25:57 EDT Ventricular Rate:  75 PR Interval:  222 QRS Duration: 114 QT Interval:  450 QTC Calculation: 503 R Axis:   4 Text Interpretation: Sinus rhythm Prolonged PR interval Abnormal R-wave progression, early transition Inferior infarct, age indeterminate Prolonged QT interval Confirmed by Merrily Pew [phone]) on 11/10/2020 1:32:13 AM  Radiology DG Chest 2 View  Result Date: 11/10/2020 CLINICAL DATA:  Chest pain EXAM: CHEST - 2 VIEW COMPARISON:  04/04/2018 FINDINGS: Lungs volumes are small, but are symmetric and are clear. No pneumothorax or pleural effusion. Cardiac size within normal limits. Pulmonary vascularity is normal. Osseous structures are age-appropriate. No acute bone abnormality. IMPRESSION: No active cardiopulmonary disease. Electronically Signed    By: Fidela Salisbury MD   On: 11/10/2020 03:40    Procedures Procedures   Medications Ordered in ED Medications  iohexol (OMNIPAQUE) 350 MG/ML injection 100 mL (100 mLs Intravenous Contrast Given 11/10/20 0324)    ED Course  I have reviewed the triage vital signs and the nursing notes.  Pertinent labs & imaging results that were available during my care of the patient were reviewed by me and considered in my medical decision making (see chart for details).    MDM Rules/Calculators/A&P                         This sounds like very GI in nature however with his history of aortic root dilation and coronary disease status post stents will obviously need work-up for that.  I think if these are okay patient likely build to be discharged home  Apparently is COVID-positive.  He does not really have any infectious symptoms and appears to be stable from that standpoint.  I do not think the COVID will get him admitted to the hospital.  In the off chance this could be a pulmonary embolus related to the COVID should be picked up on the CT scan so no real change in management at this time.  Work-up as above.  Patient ultimately found to have COVID.  He seems to be asymptomatic from that standpoint is not hypoxic no indication for admission for that.  No evidence of dissection.  ACS is basically ruled out with normal EKG and normal troponins.  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  I think both these things can be worked up as an outpatient.  He has known chronic kidney disease and will talk with his nephrologist however referrals for vascular surgery and oncology were  placed.  Patient is already on aspirin.  Will not escalate any further.  Not have any back or flank pain to assume that this dissection is acute.  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

## 2020-11-10 NOTE — ED Triage Notes (Signed)
Pt from home by [facility] for eval of CP x3hrs. CP started on R side, radiated across chest. Described as sharp, stabbing. Pt felt nauseous, vomited, felt better (6-8 to 4/10). Pt has hx of silent MI, stent placement. EMS gave 1ntg, 51mL bolus & 4mg  zofran in 18LAC  VSS en route

## 2020-11-10 NOTE — ED Notes (Signed)
Patient transported to X-ray 

## 2020-11-20 DIAGNOSIS — Z08 Encounter for follow-up examination after completed treatment for malignant neoplasm: Secondary | ICD-10-CM | POA: Diagnosis not present

## 2020-11-20 DIAGNOSIS — Z85828 Personal history of other malignant neoplasm of skin: Secondary | ICD-10-CM | POA: Diagnosis not present

## 2020-12-16 ENCOUNTER — Ambulatory Visit: Payer: Medicare HMO | Admitting: Surgery

## 2020-12-16 ENCOUNTER — Encounter: Payer: Self-pay | Admitting: Surgery

## 2020-12-16 ENCOUNTER — Other Ambulatory Visit: Payer: Self-pay

## 2020-12-16 VITALS — BP 118/72 | HR 79 | Temp 98.3°F | Resp 20 | Ht 65.5 in | Wt 180.0 lb

## 2020-12-16 DIAGNOSIS — D631 Anemia in chronic kidney disease: Secondary | ICD-10-CM | POA: Diagnosis not present

## 2020-12-16 DIAGNOSIS — I129 Hypertensive chronic kidney disease with stage 1 through stage 4 chronic kidney disease, or unspecified chronic kidney disease: Secondary | ICD-10-CM | POA: Diagnosis not present

## 2020-12-16 DIAGNOSIS — I701 Atherosclerosis of renal artery: Secondary | ICD-10-CM | POA: Diagnosis not present

## 2020-12-16 DIAGNOSIS — I773 Arterial fibromuscular dysplasia: Secondary | ICD-10-CM | POA: Diagnosis not present

## 2020-12-16 DIAGNOSIS — N281 Cyst of kidney, acquired: Secondary | ICD-10-CM | POA: Diagnosis not present

## 2020-12-16 DIAGNOSIS — N1831 Chronic kidney disease, stage 3a: Secondary | ICD-10-CM | POA: Diagnosis not present

## 2020-12-16 DIAGNOSIS — N189 Chronic kidney disease, unspecified: Secondary | ICD-10-CM | POA: Diagnosis not present

## 2020-12-16 NOTE — Progress Notes (Signed)
Vascular and Vein Specialist of [facility]  Patient name: Bruce [name] MRN: [id number] DOB: Jan 29, 1936 Sex: male   REQUESTING PROVIDER:   ER   REASON FOR CONSULT:    Right renal artery FMD  HISTORY OF PRESENT ILLNESS:   Bruce [name] is a 85 y.o. male, who presented to the emergency department in June 2022 with chest pain.  He has undergone PCI in 2001.  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  Incidentally he was found to be COVID-positive.  Patient is medically managed for hypertension.  He is a non-smoker.  He takes a statin for hypercholesterolemia.  PAST MEDICAL HISTORY    Past Medical History:  Diagnosis Date   Arthritis    Coronary artery disease    ETT-Myoview EF 51%, inf infarct, no ischemia, low risk   History of echocardiogram    Echo 3/17: EF 55-60%, inferior HK, grade 1 diastolic dysfunctione   History of kidney stones    Hypertension    Macular degeneration    Myocardial infarction ([place]) 2000   with stent   PONV (postoperative nausea and vomiting)    Sleep apnea    cpap in use      FAMILY HISTORY   Family History  Problem Relation Age of Onset   Coronary artery disease Father    Kidney disease Sister    Hyperlipidemia Sister    Heart disease Brother    Diabetes Brother    Coronary artery disease Brother    Heart attack Sister     SOCIAL HISTORY:   Social History   Socioeconomic History   Marital status: Married    Spouse name: Not on file   Number of children: Not on file   Years of education: Not on file   Highest education level: Not on file  Occupational History   Not on file  Tobacco Use   Smoking status: Never   Smokeless tobacco: Never  Vaping Use   Vaping Use: Never used  Substance and Sexual Activity   Alcohol use: No   Drug use: No   Sexual  activity: Yes  Other Topics Concern   Not on file  Social History Narrative   Not on file   Social Determinants of Health   Financial Resource Strain: Not on file  Food Insecurity: Not on file  Transportation Needs: Not on file  Physical Activity: Not on file  Stress: Not on file  Social Connections: Not on file  Intimate Partner Violence: Not on file    ALLERGIES:    Allergies  Allergen Reactions   Aldactone [Spironolactone] Rash   Aldomet [Methyldopa] Itching and Rash   Plavix [Clopidogrel Bisulfate] Itching and Rash    CURRENT MEDICATIONS:    Current Outpatient Medications  Medication Sig Dispense Refill   amLODipine (NORVASC) 10 MG tablet Take 10 mg by mouth daily.     amoxicillin (AMOXIL) 500 MG capsule amoxicillin 500 mg capsule  TAKE 4 CAPSULES BY MOUTH 1 HOUR PRIOR TO DENTAL PROCEDURE     aspirin 81 MG chewable tablet aspirin 81 mg chewable tablet  Chew 1 tablet every day by oral route.     atorvastatin (LIPITOR) 80 MG tablet Take 80 mg by mouth daily.     beta carotene w/minerals (OCUVITE) tablet Take 1 tablet by mouth daily.  doxazosin (CARDURA) 8 MG tablet Take 8 mg by mouth at bedtime.     ferrous sulfate (FERROUSUL) 325 (65 FE) MG tablet Take 1 tablet (325 mg total) by mouth 3 (three) times daily with meals for 14 days. 42 tablet 0   Multiple Vitamin (MULTIVITAMIN WITH MINERALS) TABS tablet Take 1 tablet by mouth daily.     omeprazole (PRILOSEC) 40 MG capsule Take 40 mg by mouth daily.     quinapril (ACCUPRIL) 40 MG tablet TAKE 1 TABLET (40 MG TOTAL) BY MOUTH AT BEDTIME. 90 tablet 1   No current facility-administered medications for this visit.    REVIEW OF SYSTEMS:   '[X]'$  denotes positive finding, '[ ]'$  denotes negative finding Cardiac  Comments:  Chest pain or chest pressure:    Shortness of breath upon exertion:    Short of breath when lying flat:    Irregular heart rhythm:        Vascular    Pain in calf, thigh, or hip brought on by  ambulation:    Pain in feet at night that wakes you up from your sleep:     Blood clot in your veins:    Leg swelling:         Pulmonary    Oxygen at home:    Productive cough:     Wheezing:         Neurologic    Sudden weakness in arms or legs:     Sudden numbness in arms or legs:     Sudden onset of difficulty speaking or slurred speech:    Temporary loss of vision in one eye:     Problems with dizziness:         Gastrointestinal    Blood in stool:      Vomited blood:         Genitourinary    Burning when urinating:     Blood in urine:        Psychiatric    Major depression:         Hematologic    Bleeding problems:    Problems with blood clotting too easily:        Skin    Rashes or ulcers:        Constitutional    Fever or chills:     PHYSICAL EXAM:   There were no vitals filed for this visit.  GENERAL: The patient is a well-nourished male, in no acute distress. The vital signs are documented above. CARDIAC: There is a regular rate and rhythm.  VASCULAR: Palpable pedal pulses PULMONARY: Nonlabored respirations ABDOMEN: Soft and non-tender  MUSCULOSKELETAL: There are no major deformities or cyanosis. NEUROLOGIC: No focal weakness or paresthesias are detected. SKIN: There are no ulcers or rashes noted. PSYCHIATRIC: The patient has a normal affect.  STUDIES:   I have reviewed hte following CTA: No evidence of a thoracoabdominal aortic aneurysm or dissection.   Moderate multi-vessel coronary artery calcification. Morphologic changes in keeping with pulmonary arterial hypertension.   Fibromuscular dysplasia involving the right renal artery. Possible non flow limiting dissection involving the proximal and mid segment of the solitary right renal artery. Normal enhancement and preserved cortical thickness of the right kidney.   7 mm probable renal cell carcinoma involving the upper pole of the left kidney.   Extensive sigmoid diverticulosis without  superimposed inflammatory change.   Aortic Atherosclerosis (ICD10-I70.0).    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  As long as his blood pressure is able to be controlled with medications I would not recommend intervention.  There is the possibility of delayed aneurysmal change and so this will need to be followed up at a later date.  He also has a possible renal cell cancer on the left kidney which is going to be evaluated by urology.  I have not scheduled the patient for follow-up.  He knows to contact me should he develop any issues.   Leia Alf, MD, FACS [facility] and Vein Specialists of [facility] [phone] Pager [phone]

## 2020-12-18 ENCOUNTER — Other Ambulatory Visit: Payer: Self-pay | Admitting: *Deleted

## 2020-12-18 MED ORDER — QUINAPRIL HCL 40 MG PO TABS: 40.0000 mg | ORAL_TABLET | Freq: Every day | ORAL | 3 refills | Status: DC

## 2020-12-19 ENCOUNTER — Telehealth: Payer: Self-pay | Admitting: Cardiology

## 2020-12-19 MED ORDER — QUINAPRIL HCL 40 MG PO TABS: 40.0000 mg | ORAL_TABLET | Freq: Every day | ORAL | 0 refills | Status: DC

## 2020-12-19 NOTE — Telephone Encounter (Signed)
*  STAT* If patient is at the pharmacy, call can be transferred to refill team.   1. Which medications need to be refilled? (please list name of each medication and dose if known)  quinapril (ACCUPRIL) 40 MG tablet  2. Which pharmacy/location (including street and city if local pharmacy) is medication to be sent to?  [id number] [place], [place] - [address]  3. Do they need a 30 day or 90 day supply? [address]

## 2020-12-19 NOTE — Telephone Encounter (Signed)
30 day refill for Qnapril has been sent to [place], per pt request.

## 2020-12-23 ENCOUNTER — Other Ambulatory Visit: Payer: Self-pay

## 2020-12-23 MED ORDER — QUINAPRIL HCL 40 MG PO TABS: 40.0000 mg | ORAL_TABLET | Freq: Every day | ORAL | 0 refills | Status: DC

## 2021-01-03 DIAGNOSIS — D49511 Neoplasm of unspecified behavior of right kidney: Secondary | ICD-10-CM | POA: Diagnosis not present

## 2021-01-03 DIAGNOSIS — D3000 Benign neoplasm of unspecified kidney: Secondary | ICD-10-CM | POA: Diagnosis not present

## 2021-01-08 DIAGNOSIS — H903 Sensorineural hearing loss, bilateral: Secondary | ICD-10-CM | POA: Diagnosis not present

## 2021-01-21 DIAGNOSIS — H903 Sensorineural hearing loss, bilateral: Secondary | ICD-10-CM | POA: Diagnosis not present

## 2021-02-03 DIAGNOSIS — G4733 Obstructive sleep apnea (adult) (pediatric): Secondary | ICD-10-CM | POA: Diagnosis not present

## 2021-03-12 DIAGNOSIS — Z6829 Body mass index (BMI) 29.0-29.9, adult: Secondary | ICD-10-CM | POA: Diagnosis not present

## 2021-03-12 DIAGNOSIS — N183 Chronic kidney disease, stage 3 unspecified: Secondary | ICD-10-CM | POA: Diagnosis not present

## 2021-03-12 DIAGNOSIS — Z125 Encounter for screening for malignant neoplasm of prostate: Secondary | ICD-10-CM | POA: Diagnosis not present

## 2021-03-12 DIAGNOSIS — Z0000 Encounter for general adult medical examination without abnormal findings: Secondary | ICD-10-CM | POA: Diagnosis not present

## 2021-03-12 DIAGNOSIS — N400 Enlarged prostate without lower urinary tract symptoms: Secondary | ICD-10-CM | POA: Diagnosis not present

## 2021-03-12 DIAGNOSIS — E7800 Pure hypercholesterolemia, unspecified: Secondary | ICD-10-CM | POA: Diagnosis not present

## 2021-03-12 DIAGNOSIS — I25119 Atherosclerotic heart disease of native coronary artery with unspecified angina pectoris: Secondary | ICD-10-CM | POA: Diagnosis not present

## 2021-03-12 DIAGNOSIS — I10 Essential (primary) hypertension: Secondary | ICD-10-CM | POA: Diagnosis not present

## 2021-03-12 DIAGNOSIS — G629 Polyneuropathy, unspecified: Secondary | ICD-10-CM | POA: Diagnosis not present

## 2021-04-21 ENCOUNTER — Telehealth: Payer: Self-pay | Admitting: Cardiology

## 2021-04-21 NOTE — Telephone Encounter (Signed)
Spoke with pt and Saturday while out shopping became dizzy pt thought sugar was low so ate a piece of candy with no relief Per pt watch after that notified had HR of 45  for 10 minutes and has been dizzy since and feels"washed out" Per pt B/P has been running 140/70's Will forward to Dr Marlou Porch for review and recommendations .Pt has no other symptoms ./cy

## 2021-04-21 NOTE — Telephone Encounter (Signed)
PT AWARE APPT MADE FOR 04/23/21 AT 10:30 AM .Adonis Housekeeper

## 2021-04-21 NOTE — Telephone Encounter (Signed)
   STAT if HR is under 50 or over 120 (normal HR is 60-100 beats per minute)  What is your heart rate? Below 45  Do you have a log of your heart rate readings (document readings)?   Do you have any other symptoms? No energy  Pt's wife said last Saturday pt's watch alerted him that his HR went below 45 for 10 mins, since then pt not been feeling well and always feel tired, no energy to do anything

## 2021-04-23 ENCOUNTER — Other Ambulatory Visit: Payer: Self-pay | Admitting: Cardiovascular Disease

## 2021-04-23 ENCOUNTER — Ambulatory Visit: Payer: Medicare HMO | Admitting: Cardiovascular Disease

## 2021-04-23 ENCOUNTER — Other Ambulatory Visit: Payer: Self-pay

## 2021-04-23 ENCOUNTER — Encounter: Payer: Self-pay | Admitting: Cardiovascular Disease

## 2021-04-23 VITALS — BP 120/72 | HR 102 | Ht 65.0 in | Wt 183.0 lb

## 2021-04-23 DIAGNOSIS — R42 Dizziness and giddiness: Secondary | ICD-10-CM

## 2021-04-23 DIAGNOSIS — I2510 Atherosclerotic heart disease of native coronary artery without angina pectoris: Secondary | ICD-10-CM

## 2021-04-23 DIAGNOSIS — I10 Essential (primary) hypertension: Secondary | ICD-10-CM

## 2021-04-23 LAB — HEPATIC FUNCTION PANEL
ALT: 28 IU/L (ref 0–44)
AST: 25 IU/L (ref 0–40)
Albumin: 5 g/dL — ABNORMAL HIGH (ref 3.6–4.6)
Alkaline Phosphatase: 148 IU/L — ABNORMAL HIGH (ref 44–121)
Bilirubin Total: 0.5 mg/dL (ref 0.0–1.2)
Bilirubin, Direct: 0.16 mg/dL (ref 0.00–0.40)
Total Protein: 7.3 g/dL (ref 6.0–8.5)

## 2021-04-23 LAB — BASIC METABOLIC PANEL
BUN/Creatinine Ratio: 11 (ref 10–24)
BUN: 16 mg/dL (ref 8–27)
CO2: 21 mmol/L (ref 20–29)
Calcium: 9.1 mg/dL (ref 8.6–10.2)
Chloride: 100 mmol/L (ref 96–106)
Creatinine, Ser: 1.52 mg/dL — ABNORMAL HIGH (ref 0.76–1.27)
Glucose: 164 mg/dL — ABNORMAL HIGH (ref 70–99)
Potassium: 4.1 mmol/L (ref 3.5–5.2)
Sodium: 138 mmol/L (ref 134–144)
eGFR: 45 mL/min/{1.73_m2} — ABNORMAL LOW (ref >59)

## 2021-04-23 LAB — CBC
Hematocrit: 42.5 % (ref 37.5–51.0)
Hemoglobin: 14.6 g/dL (ref 13.0–17.7)
MCH: 31.5 pg (ref 26.6–33.0)
MCHC: 34.4 g/dL (ref 31.5–35.7)
MCV: 92 fL (ref 79–97)
Platelets: 183 10*3/uL (ref 150–450)
RBC: 4.64 x10E6/uL (ref 4.14–5.80)
RDW: 12.4 % (ref 11.6–15.4)
WBC: 6 10*3/uL (ref 3.4–10.8)

## 2021-04-23 LAB — TROPONIN T: Troponin T (Highly Sensitive): 20 ng/L (ref 0–22)

## 2021-04-23 NOTE — Progress Notes (Signed)
Cardiology Office Note:    Date:  04/23/2021   ID:  Bruce [name], DOB Feb 25, 1936, MRN [id number]  PCP:  Bruce Shanks, MD   [facility] Providers Cardiologist:  Candee Furbish, MD {    Referring MD: Bruce Shanks, MD   Chief Complaint  Patient presents with   Dizziness     Dec. 7, 2022   Bruce [name] is a 85 y.o. 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  He was shopping with his wife this past weekend Was very dizzy , felt poorly for the rest of the day  His apple watch indicated  a slow HR  His actual HR was 60 when he looked at it   Rooms spins when he lies back  - sounds like vertigo  No fever, no cough   No CP , no dyspnea   Had a silent MI in 2002,  went for cath at that time coronary stents.  His last adenosine Myoview scan was performed at [facility] in 2019 and it was normal.  Wore a heart monitor in October, 2019 which revealed short runs of atrial tachycardia.  There was no atrial fibrillation and no sustained ventricular tachycardia.   Past Medical History:  Diagnosis Date   Arthritis    Coronary artery disease    ETT-Myoview EF 51%, inf infarct, no ischemia, low risk   History of echocardiogram    Echo 3/17: EF 55-60%, inferior HK, grade 1 diastolic dysfunctione   History of kidney stones    Hypertension    Macular degeneration    Myocardial infarction ([place]) 2000   with stent   PONV (postoperative nausea and vomiting)    Sleep apnea    cpap in use     Past Surgical History:  Procedure Laterality Date   CORONARY ANGIOPLASTY     EYE SURGERY     bilateral cataract surgery with lens implant   KNEE SURGERY Right    LEFT HEART CATHETERIZATION WITH CORONARY ANGIOGRAM N/A 07/01/2011   Procedure: LEFT HEART CATHETERIZATION WITH CORONARY ANGIOGRAM;  Surgeon: Candee Furbish, MD;  Location: [facility] CATH LAB;  Service: Cardiovascular;  Laterality: N/A;   left wrist surgery     left  wrist fracture   PARTIAL KNEE ARTHROPLASTY Left 08/02/2017   Procedure: LEFT UNICOMPARTMENTAL KNEE;  Surgeon: Paralee Cancel, MD;  Location: [facility] ORS;  Service: Orthopedics;  Laterality: Left;   PARTIAL KNEE ARTHROPLASTY Right 03/02/2019   Procedure: UNICOMPARTMENTAL KNEE Medially;  Surgeon: Paralee Cancel, MD;  Location: [facility] ORS;  Service: Orthopedics;  Laterality: Right;  90 mins    Current Medications: Current Meds  Medication Sig   amLODipine (NORVASC) 10 MG tablet Take 10 mg by mouth daily.   amoxicillin (AMOXIL) 500 MG capsule amoxicillin 500 mg capsule  TAKE 4 CAPSULES BY MOUTH 1 HOUR PRIOR TO DENTAL PROCEDURE   aspirin 81 MG chewable tablet aspirin 81 mg chewable tablet  Chew 1 tablet every day by oral route.   atorvastatin (LIPITOR) 80 MG tablet Take 80 mg by mouth daily.   beta carotene w/minerals (OCUVITE) tablet Take 1 tablet by mouth daily.   doxazosin (CARDURA) 8 MG tablet Take 8 mg by mouth at bedtime.   Multiple Vitamin (MULTIVITAMIN WITH MINERALS) TABS tablet Take 1 tablet by mouth daily.   omeprazole (PRILOSEC) 40 MG capsule Take 40 mg by mouth daily.  quinapril (ACCUPRIL) 40 MG tablet Take 1 tablet (40 mg total) by mouth at bedtime.     Allergies:   Nsaids, Aldactone [spironolactone], Aldomet [methyldopa], and Plavix [clopidogrel bisulfate]   Social History   Socioeconomic History   Marital status: Married    Spouse name: Not on file   Number of children: Not on file   Years of education: Not on file   Highest education level: Not on file  Occupational History   Not on file  Tobacco Use   Smoking status: Never   Smokeless tobacco: Never  Vaping Use   Vaping Use: Never used  Substance and Sexual Activity   Alcohol use: No   Drug use: No   Sexual activity: Yes  Other Topics Concern   Not on file  Social History Narrative   Not on file   Social Determinants of Health   Financial Resource Strain: Not on file  Food Insecurity: Not on file  Transportation  Needs: Not on file  Physical Activity: Not on file  Stress: Not on file  Social Connections: Not on file     Family History: The patient's family history includes Coronary artery disease in his brother and father; Diabetes in his brother; Heart attack in his sister; Heart disease in his brother; Hyperlipidemia in his sister; Kidney disease in his sister.  ROS:   Please see the history of present illness.     All other systems reviewed and are negative.  EKGs/Labs/Other Studies Reviewed:    The following studies were reviewed today:   EKG:    April 23, 2021: Sinus tachycardia at 102.  Old inferior wall myocardial infarction.  Heart rate is faster but otherwise no changes from previous EKG.  Recent Labs: 11/10/2020: ALT 27; BUN 15; Creatinine, Ser 1.34; Hemoglobin 12.5; Platelets 141; Potassium 4.0; Sodium 136  Recent Lipid Panel    Component Value Date/Time   CHOL  04/30/2009 0535    135        ATP III CLASSIFICATION:  <200     mg/dL   Desirable  200-239  mg/dL   Borderline High  >=240    mg/dL   High          TRIG 70 04/30/2009 0535   HDL 43 04/30/2009 0535   CHOLHDL 3.1 04/30/2009 0535   VLDL 14 04/30/2009 0535   LDLCALC  04/30/2009 0535    78        Total Cholesterol/HDL:CHD Risk Coronary Heart Disease Risk Table                     Men   Women  1/2 Average Risk   3.4   3.3  Average Risk       5.0   4.4  2 X Average Risk   9.6   7.1  3 X Average Risk  23.4   11.0        Use the calculated Patient Ratio above and the CHD Risk Table to determine the patient's CHD Risk.        ATP III CLASSIFICATION (LDL):  <100     mg/dL   Optimal  100-129  mg/dL   Near or Above                    Optimal  130-159  mg/dL   Borderline  160-189  mg/dL   High  >190     mg/dL   Very High     Risk  Assessment/Calculations:     Physical Exam:    VS:  BP 120/72 (BP Location: Left Arm, Patient Position: Sitting, Cuff Size: Normal)   Pulse (!) 102   Ht 5\' 5"  (1.651 m)   Wt  183 lb (83 kg)   SpO2 97%   BMI 30.45 kg/m     Wt Readings from Last 3 Encounters:  04/23/21 183 lb (83 kg)  12/16/20 180 lb (81.6 kg)  11/10/20 175 lb (79.4 kg)     GEN: elderly , tired appearing ,  man in no acute distress HEENT: Normal NECK: No JVD; No carotid bruits LYMPHATICS: No lymphadenopathy CARDIAC: RRR, no murmurs, rubs, gallops RESPIRATORY:  Clear to auscultation without rales, wheezing or rhonchi  ABDOMEN: Soft, non-tender, non-distended MUSCULOSKELETAL:  No edema; No deformity  SKIN: Warm and dry NEUROLOGIC:  Alert and oriented x 3 PSYCHIATRIC:  Normal affect   ASSESSMENT:    1. Essential hypertension   2. Dizziness    PLAN:     Generalized fatigue: He started having fatigue 3 to 4 days ago.  He denies any chest pain.  He just has not felt well.  His Apple Watch told him that his heart rate had slowed which caused him some concern.  He has a history of silent myocardial infarction's in the past.  He has symptoms that are consistent with vertigo.  He had a sensation of room spinning when he went to lie back on the exam table.   At this time I am not convinced that this is a cardiac issue.  Will check labs including CBC, basic metabolic profile, liver enzymes, troponin level.  We will get an echocardiogram to evaluate his LV function.  He is not having any angina.  I do not think that a stress test is needed at this time.  He has no signs or symptoms of infection.  No dysuria,  no cough, no fever.  I have advised him to also check in with his medical doctor to see if they find anything.  History of coronary artery disease: He is not having any angina.  He does have a history of a silent myocardial infarction.  We will measure a troponin level to make sure that this episode this week and was not another silent MI.  3.  History of hypertension: His blood pressure is 120/70.  Because his blood pressure is on the lower side of normal and he is tachycardic we  will reduce his Cardura at least for now.  We will reduce his Cardura to 4 mg a day.   Medication Adjustments/Labs and Tests Ordered: Current medicines are reviewed at length with the patient today.  Concerns regarding medicines are outlined above.  Orders Placed This Encounter  Procedures   EKG 12-Lead   No orders of the defined types were placed in this encounter.    Patient Instructions  Medication Instructions:  Decrease DOXAZOSIN to 4 mg every day  *If you need a refill on your cardiac medications before your next appointment, please call your pharmacy*   Lab Work: TODAY BMET CBC LIVER AND TROPONIN If you have labs (blood work) drawn today and your tests are completely normal, you will receive your results only by: [place] (if you have MyChart) OR A paper copy in the mail If you have any lab test that is abnormal or we need to change your treatment, we will call you to review the results.   Testing/Procedures: Your physician has requested that you have  an echocardiogram. Echocardiography is a painless test that uses sound waves to create images of your heart. It provides your doctor with information about the size and shape of your heart and how well your heart's chambers and valves are working. This procedure takes approximately one hour. There are no restrictions for this procedure.    Follow-Up: At [facility], you and your health needs are our priority.  As part of our continuing mission to provide you with exceptional heart care, we have created designated Provider Care Teams.  These Care Teams include your primary Cardiologist (physician) and Advanced Practice Providers (APPs -  Physician Assistants and Nurse Practitioners) who all work together to provide you with the care you need, when you need it.  We recommend signing up for the patient portal called "MyChart".  Sign up information is provided on this After Visit Summary.  MyChart is used to connect with  patients for Virtual Visits (Telemedicine).  Patients are able to view lab/test results, encounter notes, upcoming appointments, etc.  Non-urgent messages can be sent to your provider as well.   To learn more about what you can do with MyChart, go to [email or web address].    Your next appointment:   4 - 5 week(s)  The format for your next appointment:   In Person  Provider:   Candee Furbish, MD {OR OR  :1    Other Instructions NONE    Signed, Mertie Moores, MD  04/23/2021 11:04 AM    [place]

## 2021-04-23 NOTE — Patient Instructions (Addendum)
Medication Instructions:  Decrease DOXAZOSIN to 4 mg every day  *If you need a refill on your cardiac medications before your next appointment, please call your pharmacy*   Lab Work: TODAY BMET CBC LIVER AND TROPONIN If you have labs (blood work) drawn today and your tests are completely normal, you will receive your results only by: [place] (if you have MyChart) OR A paper copy in the mail If you have any lab test that is abnormal or we need to change your treatment, we will call you to review the results.   Testing/Procedures: Your physician has requested that you have an echocardiogram. Echocardiography is a painless test that uses sound waves to create images of your heart. It provides your doctor with information about the size and shape of your heart and how well your heart's chambers and valves are working. This procedure takes approximately one hour. There are no restrictions for this procedure.    Follow-Up: At [facility], you and your health needs are our priority.  As part of our continuing mission to provide you with exceptional heart care, we have created designated Provider Care Teams.  These Care Teams include your primary Cardiologist (physician) and Advanced Practice Providers (APPs -  Physician Assistants and Nurse Practitioners) who all work together to provide you with the care you need, when you need it.  We recommend signing up for the patient portal called "MyChart".  Sign up information is provided on this After Visit Summary.  MyChart is used to connect with patients for Virtual Visits (Telemedicine).  Patients are able to view lab/test results, encounter notes, upcoming appointments, etc.  Non-urgent messages can be sent to your provider as well.   To learn more about what you can do with MyChart, go to [email or web address].    Your next appointment:   4 - 5 week(s)  The format for your next appointment:   In Person  Provider:   Candee Furbish, MD  {OR OR  :1    Other Instructions NONE

## 2021-05-07 ENCOUNTER — Telehealth: Payer: Self-pay | Admitting: Cardiology

## 2021-05-07 NOTE — Telephone Encounter (Signed)
Spoke with pt who is aware per Dr Marlou Porch ok to increase back to 8 mg.  He will continue to monitor his BP and make [place] aware if he had any more dizziness.  He was appreciative of the call back and information.

## 2021-05-07 NOTE — Telephone Encounter (Signed)
Patient states that he saw Dr. Cathie Olden and he cut his Doxazosin from 8mg  to 4mg .  Patient stated his BP was 154/88 this morning.  He is wondering if he should go back to the full 8mg  of Doxazosin.

## 2021-05-07 NOTE — Telephone Encounter (Signed)
Okay to increase Cardura back to 8 mg.  Continue to monitor blood pressures.

## 2021-05-07 NOTE — Telephone Encounter (Signed)
3.  History of hypertension: His blood pressure is 120/70.  Because his blood pressure is on the lower side of normal and he is tachycardic we will reduce his Cardura at least for now.  We will reduce his Cardura to 4 mg a day.  The above information is from Dr Elmarie Shiley 04/23/2021 OV note.  Pt had had an episode of dizziness and his Apple watch noted his HR was slow.  Will have Dr Marlou Porch review and call pt back with any new orders.

## 2021-05-09 ENCOUNTER — Other Ambulatory Visit: Payer: Self-pay | Admitting: Cardiology

## 2021-05-11 ENCOUNTER — Other Ambulatory Visit: Payer: Self-pay | Admitting: Cardiology

## 2021-05-15 ENCOUNTER — Ambulatory Visit (HOSPITAL_COMMUNITY): Payer: Medicare HMO | Attending: Cardiovascular Disease

## 2021-05-15 ENCOUNTER — Other Ambulatory Visit: Payer: Self-pay

## 2021-05-15 DIAGNOSIS — R42 Dizziness and giddiness: Secondary | ICD-10-CM | POA: Diagnosis not present

## 2021-05-15 DIAGNOSIS — I252 Old myocardial infarction: Secondary | ICD-10-CM | POA: Insufficient documentation

## 2021-05-15 DIAGNOSIS — I10 Essential (primary) hypertension: Secondary | ICD-10-CM | POA: Insufficient documentation

## 2021-05-15 DIAGNOSIS — I2510 Atherosclerotic heart disease of native coronary artery without angina pectoris: Secondary | ICD-10-CM | POA: Diagnosis not present

## 2021-05-15 LAB — ECHOCARDIOGRAM COMPLETE
Area-P 1/2: 3.83 cm2
Calc EF: 50.3 %
S' Lateral: 4.1 cm
Single Plane A2C EF: 52 %
Single Plane A4C EF: 50.8 %

## 2021-05-16 DIAGNOSIS — J069 Acute upper respiratory infection, unspecified: Secondary | ICD-10-CM | POA: Diagnosis not present

## 2021-05-16 DIAGNOSIS — J209 Acute bronchitis, unspecified: Secondary | ICD-10-CM | POA: Diagnosis not present

## 2021-06-02 ENCOUNTER — Encounter (INDEPENDENT_AMBULATORY_CARE_PROVIDER_SITE_OTHER): Payer: Medicare HMO | Admitting: Ophthalmology

## 2021-06-02 ENCOUNTER — Other Ambulatory Visit: Payer: Self-pay

## 2021-06-02 DIAGNOSIS — H353132 Nonexudative age-related macular degeneration, bilateral, intermediate dry stage: Secondary | ICD-10-CM

## 2021-06-02 DIAGNOSIS — H43813 Vitreous degeneration, bilateral: Secondary | ICD-10-CM | POA: Diagnosis not present

## 2021-06-02 DIAGNOSIS — H35372 Puckering of macula, left eye: Secondary | ICD-10-CM | POA: Diagnosis not present

## 2021-06-02 DIAGNOSIS — H35033 Hypertensive retinopathy, bilateral: Secondary | ICD-10-CM | POA: Diagnosis not present

## 2021-06-02 DIAGNOSIS — I10 Essential (primary) hypertension: Secondary | ICD-10-CM

## 2021-06-04 ENCOUNTER — Encounter: Payer: Self-pay | Admitting: Cardiology

## 2021-06-04 ENCOUNTER — Other Ambulatory Visit: Payer: Self-pay

## 2021-06-04 ENCOUNTER — Ambulatory Visit: Payer: Medicare HMO | Admitting: Cardiology

## 2021-06-04 DIAGNOSIS — R42 Dizziness and giddiness: Secondary | ICD-10-CM

## 2021-06-04 DIAGNOSIS — I10 Essential (primary) hypertension: Secondary | ICD-10-CM | POA: Diagnosis not present

## 2021-06-04 DIAGNOSIS — E785 Hyperlipidemia, unspecified: Secondary | ICD-10-CM | POA: Diagnosis not present

## 2021-06-04 DIAGNOSIS — I2510 Atherosclerotic heart disease of native coronary artery without angina pectoris: Secondary | ICD-10-CM | POA: Diagnosis not present

## 2021-06-04 NOTE — Progress Notes (Signed)
Cardiology Office Note:    Date:  06/04/2021   ID:  Bruce [name], DOB 1936-05-17, MRN [id number]  PCP:  Kristen Loader, FNP   [facility] Providers Cardiologist:  Candee Furbish, MD     Referring MD: Vernie Shanks, MD    History of Present Illness:    Bruce [name] is a 86 y.o. male here for the follow-up of hypertension coronary disease prior MI.  He had slow heart rate dizziness and was seen on 04/21/2021 by DOD, Dr. Acie Fredrickson at the time.  The rooms was spinning when he lays back sounded like vertigo.  Felt dizzy and poor.  His actual heart rate was 60.  Back in 2002 had a silent MI and had stents placed.  [facility] 2019 stress test was normal.  His blood pressure was 120/70 in December and because of this on the lower side his Cardura was reduced to 4 mg.  His blood pressure did increase and we increased it back to 8 mg.  Overall he seems to be doing quite well.  No chest pain no further dizziness.  He did have bronchitis shortly after his dizzy spell.  This may have been vertigo.  Past Medical History:  Diagnosis Date   Arthritis    Coronary artery disease    ETT-Myoview EF 51%, inf infarct, no ischemia, low risk   History of echocardiogram    Echo 3/17: EF 55-60%, inferior HK, grade 1 diastolic dysfunctione   History of kidney stones    Hypertension    Macular degeneration    Myocardial infarction ([place]) 2000   with stent   PONV (postoperative nausea and vomiting)    Sleep apnea    cpap in use     Past Surgical History:  Procedure Laterality Date   CORONARY ANGIOPLASTY     EYE SURGERY     bilateral cataract surgery with lens implant   KNEE SURGERY Right    LEFT HEART CATHETERIZATION WITH CORONARY ANGIOGRAM N/A 07/01/2011   Procedure: LEFT HEART CATHETERIZATION WITH CORONARY ANGIOGRAM;  Surgeon: Candee Furbish, MD;  Location: [facility] CATH LAB;  Service: Cardiovascular;  Laterality: N/A;   left wrist surgery     left wrist fracture   PARTIAL KNEE ARTHROPLASTY Left  08/02/2017   Procedure: LEFT UNICOMPARTMENTAL KNEE;  Surgeon: Paralee Cancel, MD;  Location: [facility] ORS;  Service: Orthopedics;  Laterality: Left;   PARTIAL KNEE ARTHROPLASTY Right 03/02/2019   Procedure: UNICOMPARTMENTAL KNEE Medially;  Surgeon: Paralee Cancel, MD;  Location: [facility] ORS;  Service: Orthopedics;  Laterality: Right;  90 mins    Current Medications: Current Meds  Medication Sig   amLODipine (NORVASC) 10 MG tablet Take 10 mg by mouth daily.   amoxicillin (AMOXIL) 500 MG capsule amoxicillin 500 mg capsule  TAKE 4 CAPSULES BY MOUTH 1 HOUR PRIOR TO DENTAL PROCEDURE   aspirin 81 MG chewable tablet aspirin 81 mg chewable tablet  Chew 1 tablet every day by oral route.   atorvastatin (LIPITOR) 80 MG tablet Take 80 mg by mouth daily.   beta carotene w/minerals (OCUVITE) tablet Take 1 tablet by mouth daily.   doxazosin (CARDURA) 8 MG tablet Take 8 mg by mouth daily.   Multiple Vitamin (MULTIVITAMIN WITH MINERALS) TABS tablet Take 1 tablet by mouth daily.   omeprazole (PRILOSEC) 40 MG capsule Take 40 mg by mouth daily.   quinapril (ACCUPRIL) 40 MG tablet TAKE 1 TABLET BY MOUTH EVERYDAY AT BEDTIME     Allergies:   Nsaids, Aldactone [spironolactone],  Aldomet [methyldopa], and Plavix [clopidogrel bisulfate]   Social History   Socioeconomic History   Marital status: Married    Spouse name: Not on file   Number of children: Not on file   Years of education: Not on file   Highest education level: Not on file  Occupational History   Not on file  Tobacco Use   Smoking status: Never   Smokeless tobacco: Never  Vaping Use   Vaping Use: Never used  Substance and Sexual Activity   Alcohol use: No   Drug use: No   Sexual activity: Yes  Other Topics Concern   Not on file  Social History Narrative   Not on file   Social Determinants of Health   Financial Resource Strain: Not on file  Food Insecurity: Not on file  Transportation Needs: Not on file  Physical Activity: Not on file   Stress: Not on file  Social Connections: Not on file     Family History: The patient's family history includes Coronary artery disease in his brother and father; Diabetes in his brother; Heart attack in his sister; Heart disease in his brother; Hyperlipidemia in his sister; Kidney disease in his sister.  ROS:   Please see the history of present illness.    Denies any fevers chills nausea vomiting all other systems reviewed and are negative.  EKGs/Labs/Other Studies Reviewed:    The following studies were reviewed today: Echocardiogram12/29/2022-normal EF grade 1 diastolic dysfunction no significant valvular abnormalities Recent Labs: 04/23/2021: ALT 28; BUN 16; Creatinine, Ser 1.52; Hemoglobin 14.6; Platelets 183; Potassium 4.1; Sodium 138  Recent Lipid Panel    Component Value Date/Time   CHOL  04/30/2009 0535    135        ATP III CLASSIFICATION:  <200     mg/dL   Desirable  200-239  mg/dL   Borderline High  >=240    mg/dL   High          TRIG 70 04/30/2009 0535   HDL 43 04/30/2009 0535   CHOLHDL 3.1 04/30/2009 0535   VLDL 14 04/30/2009 0535   LDLCALC  04/30/2009 0535    78        Total Cholesterol/HDL:CHD Risk Coronary Heart Disease Risk Table                     Men   Women  1/2 Average Risk   3.4   3.3  Average Risk       5.0   4.4  2 X Average Risk   9.6   7.1  3 X Average Risk  23.4   11.0        Use the calculated Patient Ratio above and the CHD Risk Table to determine the patient's CHD Risk.        ATP III CLASSIFICATION (LDL):  <100     mg/dL   Optimal  100-129  mg/dL   Near or Above                    Optimal  130-159  mg/dL   Borderline  160-189  mg/dL   High  >190     mg/dL   Very High     Risk Assessment/Calculations:              Physical Exam:    VS:  BP 100/60 (BP Location: Left Arm, Patient Position: Sitting, Cuff Size: Normal)    Pulse 95    Ht  5\' 5"  (1.651 m)    Wt 175 lb (79.4 kg)    SpO2 91%    BMI 29.12 kg/m     Wt Readings  from Last 3 Encounters:  06/04/21 175 lb (79.4 kg)  04/23/21 183 lb (83 kg)  12/16/20 180 lb (81.6 kg)     GEN:  Well nourished, well developed in no acute distress HEENT: Normal NECK: No JVD; No carotid bruits LYMPHATICS: No lymphadenopathy CARDIAC: RRR, no murmurs, no rubs, gallops RESPIRATORY:  Clear to auscultation without rales, wheezing or rhonchi  ABDOMEN: Soft, non-tender, non-distended MUSCULOSKELETAL:  No edema; No deformity  SKIN: Warm and dry NEUROLOGIC:  Alert and oriented x 3 PSYCHIATRIC:  Normal affect   ASSESSMENT:    1. ASCVD (arteriosclerotic cardiovascular disease)   2. Dyslipidemia   3. Essential hypertension, benign   4. Vertigo    PLAN:    In order of problems listed above:  ASCVD (arteriosclerotic cardiovascular disease) Previously placed stent.  No chest pain.  Doing well.  Continue with aspirin 81 mg, statin, good blood pressure control.  Dyslipidemia Continue with atorvastatin 80 mg a day.  Last LDL 70 October 2022.  Excellent.  No myalgias on this medical management.  Essential hypertension, benign He is back up on his Cardura 8 mg as well as quinapril 40 mg at bedtime and Norvasc 10 mg a day.  Excellent control.  No changes made.  Blood pressure at home has been normal  Vertigo Thankfully, this has passed.      Medication Adjustments/Labs and Tests Ordered: Current medicines are reviewed at length with the patient today.  Concerns regarding medicines are outlined above.  No orders of the defined types were placed in this encounter.  No orders of the defined types were placed in this encounter.   Patient Instructions  Medication Instructions:  Your physician recommends that you continue on your current medications as directed. Please refer to the Current Medication list given to you today.  *If you need a refill on your cardiac medications before your next appointment, please call your pharmacy*   Lab Work: NONE If you have labs  (blood work) drawn today and your tests are completely normal, you will receive your results only by: [place] (if you have MyChart) OR A paper copy in the mail If you have any lab test that is abnormal or we need to change your treatment, we will call you to review the results.   Testing/Procedures: NONE   Follow-Up: At [facility], you and your health needs are our priority.  As part of our continuing mission to provide you with exceptional heart care, we have created designated Provider Care Teams.  These Care Teams include your primary Cardiologist (physician) and Advanced Practice Providers (APPs -  Physician Assistants and Nurse Practitioners) who all work together to provide you with the care you need, when you need it.   Your next appointment:   6 month(s)  The format for your next appointment:   In Person  Provider:   Melina Copa, PA-C, Ermalinda Barrios, PA-C, or Richardson Dopp, PA-C  in 6 months, Then, Candee Furbish, MD will plan to see you again in 12 month(s).     Signed, Candee Furbish, MD  06/04/2021 5:15 PM    [facility]

## 2021-06-04 NOTE — Assessment & Plan Note (Signed)
Previously placed stent.  No chest pain.  Doing well.  Continue with aspirin 81 mg, statin, good blood pressure control.

## 2021-06-04 NOTE — Patient Instructions (Signed)
Medication Instructions:  Your physician recommends that you continue on your current medications as directed. Please refer to the Current Medication list given to you today.  *If you need a refill on your cardiac medications before your next appointment, please call your pharmacy*   Lab Work: NONE If you have labs (blood work) drawn today and your tests are completely normal, you will receive your results only by: [place] (if you have MyChart) OR A paper copy in the mail If you have any lab test that is abnormal or we need to change your treatment, we will call you to review the results.   Testing/Procedures: NONE   Follow-Up: At [facility], you and your health needs are our priority.  As part of our continuing mission to provide you with exceptional heart care, we have created designated Provider Care Teams.  These Care Teams include your primary Cardiologist (physician) and Advanced Practice Providers (APPs -  Physician Assistants and Nurse Practitioners) who all work together to provide you with the care you need, when you need it.   Your next appointment:   6 month(s)  The format for your next appointment:   In Person  Provider:   Melina Copa, PA-C, Ermalinda Barrios, PA-C, or Richardson Dopp, PA-C  in 6 months, Then, Candee Furbish, MD will plan to see you again in 12 month(s).

## 2021-06-04 NOTE — Assessment & Plan Note (Signed)
Continue with atorvastatin 80 mg a day.  Last LDL 70 October 2022.  Excellent.  No myalgias on this medical management.

## 2021-06-04 NOTE — Assessment & Plan Note (Signed)
He is back up on his Cardura 8 mg as well as quinapril 40 mg at bedtime and Norvasc 10 mg a day.  Excellent control.  No changes made.  Blood pressure at home has been normal

## 2021-06-04 NOTE — Assessment & Plan Note (Signed)
Thankfully, this has passed.

## 2021-06-09 DIAGNOSIS — N189 Chronic kidney disease, unspecified: Secondary | ICD-10-CM | POA: Diagnosis not present

## 2021-06-09 DIAGNOSIS — G4733 Obstructive sleep apnea (adult) (pediatric): Secondary | ICD-10-CM | POA: Diagnosis not present

## 2021-06-09 DIAGNOSIS — N1831 Chronic kidney disease, stage 3a: Secondary | ICD-10-CM | POA: Diagnosis not present

## 2021-06-10 NOTE — Telephone Encounter (Signed)
Pt states he has RX for Quinapril now

## 2021-06-11 ENCOUNTER — Telehealth (HOSPITAL_BASED_OUTPATIENT_CLINIC_OR_DEPARTMENT_OTHER): Payer: Self-pay | Admitting: *Deleted

## 2021-06-11 MED ORDER — QUINAPRIL HCL 40 MG PO TABS: ORAL_TABLET | ORAL | 2 refills | Status: DC

## 2021-06-11 NOTE — Telephone Encounter (Signed)
RX for Quinapril 40 mg sent electronically to [facility] as requested.

## 2021-06-11 NOTE — Addendum Note (Signed)
Addended by: Shellia Cleverly on: 06/11/2021 11:50 AM   Modules accepted: Orders

## 2021-06-11 NOTE — Telephone Encounter (Signed)
Patient called and stated that Dr. Kingsley Plan nurse called him about a medication. He stated that the medication is Quinapril 40 mg. He stated that it came from "[facility] automatic refill. Patient stated that he has 10-12 pills left and he will need a refill. If you have any questions he can be reached at [phone]. Thank you.

## 2021-06-19 DIAGNOSIS — N281 Cyst of kidney, acquired: Secondary | ICD-10-CM | POA: Diagnosis not present

## 2021-06-19 DIAGNOSIS — D631 Anemia in chronic kidney disease: Secondary | ICD-10-CM | POA: Diagnosis not present

## 2021-06-19 DIAGNOSIS — I129 Hypertensive chronic kidney disease with stage 1 through stage 4 chronic kidney disease, or unspecified chronic kidney disease: Secondary | ICD-10-CM | POA: Diagnosis not present

## 2021-06-19 DIAGNOSIS — N1831 Chronic kidney disease, stage 3a: Secondary | ICD-10-CM | POA: Diagnosis not present

## 2021-06-27 DIAGNOSIS — N289 Disorder of kidney and ureter, unspecified: Secondary | ICD-10-CM | POA: Diagnosis not present

## 2021-06-27 DIAGNOSIS — D49511 Neoplasm of unspecified behavior of right kidney: Secondary | ICD-10-CM | POA: Diagnosis not present

## 2021-06-27 DIAGNOSIS — K5730 Diverticulosis of large intestine without perforation or abscess without bleeding: Secondary | ICD-10-CM | POA: Diagnosis not present

## 2021-06-27 DIAGNOSIS — N200 Calculus of kidney: Secondary | ICD-10-CM | POA: Diagnosis not present

## 2021-06-27 DIAGNOSIS — D3002 Benign neoplasm of left kidney: Secondary | ICD-10-CM | POA: Diagnosis not present

## 2021-07-04 DIAGNOSIS — D3002 Benign neoplasm of left kidney: Secondary | ICD-10-CM | POA: Diagnosis not present

## 2021-09-26 ENCOUNTER — Other Ambulatory Visit: Payer: Self-pay

## 2021-09-26 MED ORDER — QUINAPRIL HCL 40 MG PO TABS: ORAL_TABLET | ORAL | 0 refills | Status: DC

## 2021-09-26 NOTE — Telephone Encounter (Signed)
Pt's medication was sent to pt's pharmacy as requested. Confirmation received.  °

## 2021-10-02 ENCOUNTER — Telehealth: Payer: Self-pay

## 2021-10-02 MED ORDER — LISINOPRIL 40 MG PO TABS: 40.0000 mg | ORAL_TABLET | Freq: Every day | ORAL | 3 refills | Status: DC

## 2021-10-02 MED ORDER — LISINOPRIL 40 MG PO TABS: 40.0000 mg | ORAL_TABLET | Freq: Every day | ORAL | 1 refills | Status: DC

## 2021-10-02 NOTE — Telephone Encounter (Signed)
Pt calling stating that his pharmacies [facility] does not have his medication quinapril and pt states that he has been out of his medication and unable to get it. Pt would like to know if Dr. Marlou Porch would like to prescribe an alternative or find another pharmacy that carries this medication. Pt would like a call back concerning this matter. Please address

## 2021-10-02 NOTE — Telephone Encounter (Signed)
Per Dr Marlou Porch change to Lisinopril 40 mg daily.  Pt is aware and agreeable.  He would like the RX to be sent into both his local pharmacy  ([facility]) for 30 day supply as well as his mail order pharmacy ([place]) for a 90 day supply. Pt will call back if any questions or concerns.

## 2021-10-02 NOTE — Telephone Encounter (Signed)
Quinapril is on backorder.  Will have Dr Marlou Porch to review for appropriate alternative.

## 2021-10-17 DIAGNOSIS — M79671 Pain in right foot: Secondary | ICD-10-CM | POA: Diagnosis not present

## 2021-10-17 DIAGNOSIS — M79672 Pain in left foot: Secondary | ICD-10-CM | POA: Diagnosis not present

## 2021-10-24 ENCOUNTER — Other Ambulatory Visit: Payer: Self-pay | Admitting: Cardiology

## 2021-10-31 DIAGNOSIS — M25572 Pain in left ankle and joints of left foot: Secondary | ICD-10-CM | POA: Diagnosis not present

## 2021-10-31 DIAGNOSIS — M79672 Pain in left foot: Secondary | ICD-10-CM | POA: Diagnosis not present

## 2021-10-31 DIAGNOSIS — G609 Hereditary and idiopathic neuropathy, unspecified: Secondary | ICD-10-CM | POA: Diagnosis not present

## 2021-10-31 DIAGNOSIS — M79671 Pain in right foot: Secondary | ICD-10-CM | POA: Diagnosis not present

## 2021-11-30 NOTE — Progress Notes (Signed)
Cardiology Office Note:    Date:  12/01/2021   ID:  Bruce [name], DOB 19-Mar-1936, MRN [id number]  PCP:  Bruce [name], [place] Prov[facility] Cardiologist:  Bruce Furbish, MD     Referring MD: Bruce Loader, FNP   CC today is recent fatigue, lower left leg swelling during the day X 1 month with numbness X 1 month,  and bradycardia at night. He is also here for his regular scheduled follow up appointment.  History of Present Illness:    Bruce [name] is a 86 y.o. male with a hx of the following below:  CAD, s/p MI (Former RCA stent, 2000; Cath on 07/01/11 revealed - Previously placed RCA stent with only 10% in-stent stenosis, overall excellent. Prior LAD with 30% stenosis at large diagonal branch bifurcation).  HTN PUD OSA on CPAP Dyslipidemia Macular degeneration   Last seen in the cardiology office by Dr. Marlou [name] on 06/04/2021 and was overall doing well from a cardiac perspective. His past experience of dizziness may have been vertigo as he described the room spinning while lying down. This resolved.    Today he is here for f/u. Since his last appointment in January, he has noticed his heart rate dropping into the 40's while he is sleeping and he has been notified of this via his [place]. He is compliant with wearing his CPAP machine every night. Says during the day he does not have any abnormal heart rates. Got back from the beach on 7[place]15 and said during his stay there, he noticed his diastolic blood pressure dropping into the 50's and had some dizziness, weakness while at the beach too. Dizziness has resolved since arriving back home from the beach. Denies any feeling of the room spinning when he had these episodes. Says left lower leg swells after being on his feet all day and has some numbness of the left lateral side of his leg. Denies any pain or claudication with this. Wife says he loves to be outside and garden however after about 5-10 minutes of being  outside he gets wiped out. Denies any chest pain, shortness of breath, palpitations, syncope, presyncope, orthopnea, PND, or bleeding. Denies any other acute cardiac complaints today.      Past Medical History:  Diagnosis Date   Arthritis    Coronary artery disease    ETT-Myoview EF 51%, inf infarct, no ischemia, low risk   History of echocardiogram    Echo 3/17: EF 55-60%, inferior HK, grade 1 diastolic dysfunctione   History of kidney stones    Hypertension    Macular degeneration    Myocardial infarction ([place]) 2000   with stent   PONV (postoperative nausea and vomiting)    Sleep apnea    cpap in use     Past Surgical History:  Procedure Laterality Date   CORONARY ANGIOPLASTY     EYE SURGERY     bilateral cataract surgery with lens implant   KNEE SURGERY Right    LEFT HEART CATHETERIZATION WITH CORONARY ANGIOGRAM N/A 07/01/2011   Procedure: LEFT HEART CATHETERIZATION WITH CORONARY ANGIOGRAM;  Surgeon: Bruce Furbish, MD;  Location: [facility] CATH LAB;  Service: Cardiovascular;  Laterality: N/A;   left wrist surgery     left wrist fracture   PARTIAL KNEE ARTHROPLASTY Left 08/02/2017   Procedure: LEFT UNICOMPARTMENTAL KNEE;  Surgeon: Bruce Cancel, MD;  Location: [facility] ORS;  Service: Orthopedics;  Laterality: Left;   PARTIAL KNEE ARTHROPLASTY Right 03/02/2019  Procedure: UNICOMPARTMENTAL KNEE Medially;  Surgeon: Bruce Cancel, MD;  Location: [facility] ORS;  Service: Orthopedics;  Laterality: Right;  90 mins    Current Medications: Current Meds  Medication Sig   amLODipine (NORVASC) 10 MG tablet Take 10 mg by mouth daily.   amoxicillin (AMOXIL) 500 MG capsule amoxicillin 500 mg capsule  TAKE 4 CAPSULES BY MOUTH 1 HOUR PRIOR TO DENTAL PROCEDURE   aspirin 81 MG chewable tablet aspirin 81 mg chewable tablet  Chew 1 tablet every day by oral route.   atorvastatin (LIPITOR) 80 MG tablet Take 80 mg by mouth daily.   beta carotene w/minerals (OCUVITE) tablet Take 1 tablet by mouth daily.    doxazosin (CARDURA) 8 MG tablet Take 8 mg by mouth daily.   lisinopril (ZESTRIL) 40 MG tablet TAKE 1 TABLET BY MOUTH EVERY DAY   Multiple Vitamin (MULTIVITAMIN WITH MINERALS) TABS tablet Take 1 tablet by mouth daily.   omeprazole (PRILOSEC) 40 MG capsule Take 40 mg by mouth daily.   vitamin B-12 (CYANOCOBALAMIN) 1000 MCG tablet Take 1,000 mcg by mouth daily.     Allergies:   Clopidogrel bisulfate, Nsaids, Other, Clopidogrel, Methyldopa, Plavix [clopidogrel bisulfate], and Spironolactone   Social History   Socioeconomic History   Marital status: Married    Spouse name: Not on file   Number of children: Not on file   Years of education: Not on file   Highest education level: Not on file  Occupational History   Not on file  Tobacco Use   Smoking status: Never   Smokeless tobacco: Never  Vaping Use   Vaping Use: Never used  Substance and Sexual Activity   Alcohol use: No   Drug use: No   Sexual activity: Yes  Other Topics Concern   Not on file  Social History Narrative   Not on file   Social Determinants of Health   Financial Resource Strain: Not on file  Food Insecurity: Not on file  Transportation Needs: Not on file  Physical Activity: Not on file  Stress: Not on file  Social Connections: Not on file     Family History: The patient's family history includes Coronary artery disease in his brother and father; Diabetes in his brother; Heart attack in his sister; Heart disease in his brother; Hyperlipidemia in his sister; Kidney disease in his sister.  ROS:   Review of Systems  Constitutional:  Positive for malaise/fatigue. Negative for chills, diaphoresis, fever and weight loss.  HENT:  Negative for congestion, ear discharge, ear pain, hearing loss, nosebleeds, sinus pain, sore throat and tinnitus.   Eyes:  Negative for blurred vision, double vision, photophobia, pain, discharge and redness.  Respiratory:  Negative for cough, hemoptysis, sputum production, shortness of  breath, wheezing and stridor.   Cardiovascular:  Positive for leg swelling. Negative for chest pain, palpitations, orthopnea, claudication and PND.       See HPI for leg swelling and bradycardia at night while sleeping.   Gastrointestinal:  Negative for abdominal pain, blood in stool, constipation, diarrhea, heartburn, melena, nausea and vomiting.  Genitourinary:  Negative for dysuria, flank pain, frequency, hematuria and urgency.  Musculoskeletal:  Negative for back pain, falls, joint pain, myalgias and neck pain.  Skin:  Negative for itching and rash.  Neurological:  Negative for dizziness, tingling, tremors, sensory change, speech change, focal weakness, seizures, loss of consciousness, weakness and headaches.       Numbness of left lower leg  Endo/Heme/Allergies:  Negative for environmental allergies and polydipsia. Does  not bruise/bleed easily.  Psychiatric/Behavioral:  Negative for depression, hallucinations, memory loss, substance abuse and suicidal ideas. The patient is not nervous/anxious and does not have insomnia.     Please see the history of present illness.    All other systems reviewed and are negative.  EKGs/Labs/Other Studies Reviewed:    The following studies were reviewed today:  12 lead EKG was ordered today and revealed: NSR, with findings of previous old inferior infarct, otherwise no acute changes since last EKG.   2D Echo on 05/15/21: Left ventricular ejection fraction, by estimation, is 55 to 60%. Left ventricular ejection fraction by 3D volume is 56 %. The left ventricle has normal function. The left ventricle has no regional wall motion abnormalities. Left ventricular diastolic parameters are consistent with Grade I diastolic dysfunction (impaired relaxation).   2. Right ventricular systolic function is normal. The right ventricular size is normal. There is normal pulmonary artery systolic pressure. The estimated right ventricular systolic pressure is 99.8 mmHg.    3. The mitral valve is grossly normal. Trivial mitral valve  regurgitation. No evidence of mitral stenosis.   4. The aortic valve is tricuspid. Aortic valve regurgitation is not visualized. No aortic stenosis is present.   5. The inferior vena cava is normal in size with greater than 50% respiratory variability, suggesting right atrial pressure of 3 mmHg.   Comparison(s): No significant change from prior study.    CT Chest/abdomen/Pelvis for dissection w and/or wo contrast on 11/10/20:   Aorta: Moderate atherosclerotic calcification. No aneurysm or dissection. No hemodynamically significant stenosis. No periaortic inflammatory change.   Celiac: Widely patent. Normal anatomic configuration. 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 No additional aneurysm or dissection.   SMA: Widely patent.  No aneurysm or dissection.   Renals: Single renal arteries bilaterally. Less than 50% stenoses of the renal artery origins bilaterally. Beaded appearance of the a mid segment of the right renal artery in keeping with changes of fibromuscular dysplasia. Possible non flow limiting dissection flap within the proximal and mid right renal artery. The left renal artery demonstrates normal vascular morphology. No aneurysm.   IMA: Less than 50% stenosis of the origin.  Distally widely patent.   Inflow: Mild atherosclerotic calcification. Widely patent. No aneurysm or dissection. Internal iliac arteries are patent bilaterally.   Veins: No obvious venous abnormality within the limitations of this arterial phase study.   Review of the MIP images confirms the above findings.   NON-VASCULAR   Hepatobiliary: No focal liver abnormality is seen. No gallstones, gallbladder wall thickening, or biliary dilatation.   Pancreas: Unremarkable   Spleen: Unremarkable   Adrenals/Urinary Tract: The adrenal glands are unremarkable. The kidneys are normal in size and position. Multiple simple exophytic cortical cysts  are seen arising from the lower pole the right kidney measuring 7 cm and the interpolar region of the left kidney measuring 4.5 cm. A 7 mm exophytic enhancing lesion is seen arising from the upper pole of the left kidney likely representing a tiny renal cell carcinoma. No hydronephrosis. No intrarenal or ureteral calculi. The bladder is unremarkable.   Stomach/Bowel: Extensive sigmoid diverticulosis. The stomach, small bowel, and large bowel are otherwise unremarkable. Appendix normal. No free intraperitoneal gas or fluid.   Lymphatic: No pathologic adenopathy within the abdomen and pelvis.   Reproductive: Prostate is unremarkable.   Other: Small fat containing umbilical hernia. The rectum is unremarkable.   Musculoskeletal: No acute bone abnormality. No lytic or blastic bone lesions. Degenerative changes are  seen within the lumbar spine.   Review of the MIP images confirms the above findings.   IMPRESSION: No evidence of a thoracoabdominal aortic aneurysm or dissection.   Moderate multi-vessel coronary artery calcification. Morphologic changes in keeping with pulmonary arterial hypertension.   Fibromuscular dysplasia involving the right renal artery. Possible non flow limiting dissection involving the proximal and mid segment of the solitary right renal artery. Normal enhancement and preserved cortical thickness of the right kidney.   7 mm probable renal cell carcinoma involving the upper pole of the left kidney.   Extensive sigmoid diverticulosis without superimposed inflammatory change.   Aortic Atherosclerosis (ICD10-I70.0). Electronically Signed   By: Fidela Salisbury MD  Vascular US on 07/21/18 Doppler:   Right: No significant arterial obstruction detected in the right  upper extremity.  Left: No significant arterial obstruction detected in the left upper extremity.   Zio 14 day monitor on 02/25/18: Patient had a min HR of 27 bpm (3 beats asymptomatic), max HR of 174 bpm, and  avg HR of 71 bpm. Predominant underlying rhythm was Sinus Rhythm. First Degree AV Block was present (transient) 2 Ventricular Tachycardia runs occurred, the run with the fastest interval lasting 4 beats with a max rate of 174 bpm, the longest lasting 4 beats with an avg rate of 101 bpm. 8 Supraventricular Tachycardia runs occurred, the run with the fastest interval lasting 6 beats with a max rate of 128 bpm, the longest lasting 5 beats with an avg rate of 99 bpm. Second Degree AV Block-Mobitz I (Wenckebach) was present. Isolated SVEs were rare (<1.0%), SVE Couplets were rare (<1.0%), and SVE Triplets were rare (<1.0%). Isolated VEs were rare (<1.0%, 13340), VE Couplets were rare (<1.0%, 40), and VE Triplets were rare (<1.0%, 1). Ventricular Bigeminy and Trigeminy were present. No atrial fibrillation, no adverse arrhythmias   Overall reassuring monitor with no afib. No sustained VT (normal EF on ECHO), Palpitations are likely secondary rare paroxysmal atrial tachycardia or rare PVC/PAC's.  Could consider low dose diltiazem CD 162m PO QD to see if this helps reduce the skipped beats.  MCandee Furbish MD  Nuclear Medicine Stress Test on 11/13/17, done [facility] Resting EKG: NSR Stress EKG: No diagnostic changes Impression: Normal Adenosine stress test without ischemic symptoms or EKG changes.   Recent Labs: 04/23/2021: ALT 28; BUN 16; Creatinine, Ser 1.52; Hemoglobin 14.6; Platelets 183; Potassium 4.1; Sodium 138  Recent Lipid Panel    Component Value Date/Time   CHOL  04/30/2009 0535    135        ATP III CLASSIFICATION:  <200     mg/dL   Desirable  200-239  mg/dL   Borderline High  >=240    mg/dL   High          TRIG 70 04/30/2009 0535   HDL 43 04/30/2009 0535   CHOLHDL 3.1 04/30/2009 0535   VLDL 14 04/30/2009 0535   LDLCALC  04/30/2009 0535    78        Total Cholesterol/HDL:CHD Risk Coronary Heart Disease Risk Table                     Men   Women  1/2 Average Risk   3.4    3.3  Average Risk       5.0   4.4  2 X Average Risk   9.6   7.1  3 X Average Risk  23.4   11.0  Use the calculated Patient Ratio above and the CHD Risk Table to determine the patient's CHD Risk.        ATP III CLASSIFICATION (LDL):  <100     mg/dL   Optimal  100-129  mg/dL   Near or Above                    Optimal  130-159  mg/dL   Borderline  160-189  mg/dL   High  >190     mg/dL   Very High     Physical Exam:    VS:  BP 118/62   Pulse 74   Ht _0  (1.651 m)   Wt 183 lb 3.2 oz (83.1 kg)   SpO2 96%   BMI 30.49 kg/m     Wt Readings from Last 3 Encounters:  12/01/21 183 lb 3.2 oz (83.1 kg)  06/04/21 175 lb (79.4 kg)  04/23/21 183 lb (83 kg)     GEN: Well nourished, well developed male in no acute distress HEENT: Normal NECK: No JVD; No carotid bruits CARDIAC: RRR, no murmurs, rubs, gallops RESPIRATORY:  Clear to auscultation without rales, wheezing or rhonchi  ABDOMEN: Soft, non-tender, non-distended MUSCULOSKELETAL:  No edema; No deformity  SKIN: Warm and dry NEUROLOGIC:  Alert and oriented x 3 PSYCHIATRIC:  Normal affect   ASSESSMENT:    1. Coronary artery disease involving native heart without angina pectoris, unspecified vessel or lesion type   2. Bradycardia   3. Localized swelling of left lower leg   4. Other fatigue   5. Numbness   6. Hypertension, unspecified type   7. OSA on CPAP   8. Dyslipidemia    PLAN:    In order of problems listed above:  Bradycardia - acute Fatigue - acute Bradycardia occurs at night (HR drops into the 40s and he is notified of this via his Apple Watch). May be related to his OSA and last monitor back in 2019 revealed an overall good monitor without any A-fib. We will obtain a 14 day Zio monitor to r/o any other underlying arrhthymia that may be contributing to his symptoms. Fatigue may also be related to his bradycardia at night; however, we will obtain the following blood work to evaluate the cause: TSH, CBC with  differential, CMET, and vitamin B12.   3. Localized swelling of left lower leg, numbness of left lower leg - acute No concern for DVT as left lower leg did not appear swollen, bilateral lower extremity PT pulses equal +1 bilaterally. Negative Homan's sign on exam. Pt denies claudication. This may be due to being on his feet all day since he said his swelling goes down in the morning after he has slept. Recommend wearing compression stockings during the day. If pt continues to have these symptoms, may want to evaluate circulation with ABI.   CAD, dyslipidemia - chronic, stable Stable with no anginal symptoms. No indication for ischemic evaluation. Will continue to monitor. Continue current medication regimen. Will obtain a fasting lipid panel when he is fasting. He is due for her lipids to be rechecked. Last LDL was 70 on 03/12/2021. Continue current medication regimen.   HTN - chronic, stable Recent low diastolic blood pressure readings in the 50s otherwise he denies any other abnormal BP readings. BP on exam today is stable at 118/62 and on recheck it was 117/72. Will obtain a CMET today. Discussed to monitor BP at home at least 2 hours after medications and sitting for  5-10 minutes. Discussed with him to send me a MyChart message in 2 weeks with his blood pressure readings. Will continue to monitor.   OSA on CPAP - chronic, stable Patient is compliant with his CPAP use. Continue CPAP. This is stable. Will continue to monitor.   6. Disposition: F/U in 3 months or sooner if needed.     Medication Adjustments/Labs and Tests Ordered: Current medicines are reviewed at length with the patient today.  Concerns regarding medicines are outlined above.  Orders Placed This Encounter  Procedures   TSH   Comp Met (CMET)   B12   CBC w/Diff   LONG TERM MONITOR (3-14 DAYS)   EKG 12-Lead   No orders of the defined types were placed in this encounter.   Patient Instructions  Medication Instructions:   Your physician recommends that you continue on your current medications as directed. Please refer to the Current Medication list given to you today.  *If you need a refill on your cardiac medications before your next appointment, please call your pharmacy*   Lab Work: TODAY: TSH, CBC W DIFF, CMET, & B12  WHENEVER YOU ARE AVAILABLE:  COME BACK TO THE OFFICE FOR FASTING LIPID (nothing to eat or drink after midnight the night before)   If you have labs (blood work) drawn today and your tests are completely normal, you will receive your results only by: [place] (if you have MyChart) OR A paper copy in the mail If you have any lab test that is abnormal or we need to change your treatment, we will call you to review the results.   Testing/Procedures: [place]- Long Term Monitor Instructions  Your physician has requested you wear a ZIO patch monitor for 14 days.  This is a single patch monitor. [employer] supplies one patch monitor per enrollment. Additional stickers are not available. Please do not apply patch if you will be having a Nuclear Stress Test,  Echocardiogram, Cardiac CT, MRI, or Chest Xray during the period you would be wearing the  monitor. The patch cannot be worn during these tests. You cannot remove and re-apply the  ZIO XT patch monitor.  Your ZIO patch monitor will be mailed 3 day USPS to your address on file. It may take 3-5 days  to receive your monitor after you have been enrolled.  Once you have received your monitor, please review the enclosed instructions. Your monitor  has already been registered assigning a specific monitor serial # to you.  Billing and Patient Assistance Program Information  We have supplied [employer] with any of your insurance information on file for billing purposes. [employer] offers a sliding scale Patient Assistance Program for patients that do not have  insurance, or whose insurance does not completely cover the cost of the ZIO monitor.   You must apply for the Patient Assistance Program to qualify for this discounted rate.  To apply, please call [employer] at [phone], select option 4, select option 2, ask to apply for  Patient Assistance Program. Theodore Demark will ask your household income, and how many people  are in your household. They will quote your out-of-pocket cost based on that information.  [employer] will also be able to set up a 19-month interest-free payment plan if needed.  Applying the monitor   Shave hair from upper left chest.  Hold abrader disc by orange tab. Rub abrader in 40 strokes over the upper left chest as  indicated in your monitor instructions.  Clean area with 4  enclosed alcohol pads. Let dry.  Apply patch as indicated in monitor instructions. Patch will be placed under collarbone on left  side of chest with arrow pointing upward.  Rub patch adhesive wings for 2 minutes. Remove white label marked "1". Remove the white  label marked "2". Rub patch adhesive wings for 2 additional minutes.  While looking in a mirror, press and release button in center of patch. A small green light will  flash 3-4 times. This will be your only indicator that the monitor has been turned on.  Do not shower for the first 24 hours. You may shower after the first 24 hours.  Press the button if you feel a symptom. You will hear a small click. Record Date, Time and  Symptom in the Patient Logbook.  When you are ready to remove the patch, follow instructions on the last 2 pages of Patient  Logbook. Stick patch monitor onto the last page of Patient Logbook.  Place Patient Logbook in the blue and white box. Use locking tab on box and tape box closed  securely. The blue and white box has prepaid postage on it. Please place it in the mailbox as  soon as possible. Your physician should have your test results approximately 7 days after the  monitor has been mailed back to [facility].  Call [place] at  [phone] if you have questions regarding  your ZIO XT patch monitor. Call them immediately if you see an orange light blinking on your  monitor.  If your monitor falls off in less than 4 days, contact our Monitor department at [phone].  If your monitor becomes loose or falls off after 4 days call [employer] at [phone] for  suggestions on securing your monitor   Follow-Up: At [facility], you and your health needs are our priority.  As part of our continuing mission to provide you with exceptional heart care, we have created designated Provider Care Teams.  These Care Teams include your primary Cardiologist (physician) and Advanced Practice Providers (APPs -  Physician Assistants and Nurse Practitioners) who all work together to provide you with the care you need, when you need it.  We recommend signing up for the patient portal called "MyChart".  Sign up information is provided on this After Visit Summary.  MyChart is used to connect with patients for Virtual Visits (Telemedicine).  Patients are able to view lab/test results, encounter notes, upcoming appointments, etc.  Non-urgent messages can be sent to your provider as well.   To learn more about what you can do with MyChart, go to [email or web address].    Your next appointment:   3 MONTHS   The format for your next appointment:   In Person  Provider:   Candee Furbish, MD     Other Instructions Your physician has requested that you regularly monitor and record your blood pressure readings at home. Please use the same machine at the same time of day to check your readings and record them to bring to your follow-up visit.   Please monitor blood pressures and keep a log of your readings for 2 weeks then send a mychart message to me.    Make sure to check 2 hours after your medications.    AVOID these things for 30 minutes before checking your blood pressure: No Drinking caffeine. No Drinking alcohol. No Eating. No  Smoking. No Exercising.   Five minutes before checking your blood pressure: Pee. Sit in a dining chair. Avoid sitting  in a soft couch or armchair. Be quiet. Do not talk   Important Information About Sugar         Signed, Finis Bud, NP  12/01/2021 3:14 PM    [place]

## 2021-12-01 ENCOUNTER — Ambulatory Visit (INDEPENDENT_AMBULATORY_CARE_PROVIDER_SITE_OTHER): Payer: Medicare HMO

## 2021-12-01 ENCOUNTER — Ambulatory Visit: Payer: Medicare HMO | Admitting: Nurse Practitioner

## 2021-12-01 ENCOUNTER — Encounter: Payer: Self-pay | Admitting: Nurse Practitioner

## 2021-12-01 VITALS — BP 118/62 | HR 74 | Ht 65.0 in | Wt 183.2 lb

## 2021-12-01 DIAGNOSIS — Z9989 Dependence on other enabling machines and devices: Secondary | ICD-10-CM | POA: Diagnosis not present

## 2021-12-01 DIAGNOSIS — R200 Anesthesia of skin: Secondary | ICD-10-CM | POA: Diagnosis not present

## 2021-12-01 DIAGNOSIS — G4733 Obstructive sleep apnea (adult) (pediatric): Secondary | ICD-10-CM

## 2021-12-01 DIAGNOSIS — I2510 Atherosclerotic heart disease of native coronary artery without angina pectoris: Secondary | ICD-10-CM

## 2021-12-01 DIAGNOSIS — I10 Essential (primary) hypertension: Secondary | ICD-10-CM | POA: Diagnosis not present

## 2021-12-01 DIAGNOSIS — R5383 Other fatigue: Secondary | ICD-10-CM | POA: Diagnosis not present

## 2021-12-01 DIAGNOSIS — E785 Hyperlipidemia, unspecified: Secondary | ICD-10-CM

## 2021-12-01 DIAGNOSIS — R2242 Localized swelling, mass and lump, left lower limb: Secondary | ICD-10-CM

## 2021-12-01 DIAGNOSIS — R5382 Chronic fatigue, unspecified: Secondary | ICD-10-CM | POA: Diagnosis not present

## 2021-12-01 DIAGNOSIS — R001 Bradycardia, unspecified: Secondary | ICD-10-CM | POA: Diagnosis not present

## 2021-12-01 DIAGNOSIS — D519 Vitamin B12 deficiency anemia, unspecified: Secondary | ICD-10-CM | POA: Diagnosis not present

## 2021-12-01 NOTE — Patient Instructions (Addendum)
Medication Instructions:  Your physician recommends that you continue on your current medications as directed. Please refer to the Current Medication list given to you today.  *If you need a refill on your cardiac medications before your next appointment, please call your pharmacy*   Lab Work: TODAY: TSH, CBC W DIFF, CMET, & B12  WHENEVER YOU ARE AVAILABLE:  COME BACK TO THE OFFICE FOR FASTING LIPID (nothing to eat or drink after midnight the night before)   If you have labs (blood work) drawn today and your tests are completely normal, you will receive your results only by: [facility] (if you have MyChart) OR A paper copy in the mail If you have any lab test that is abnormal or we need to change your treatment, we will call you to review the results.   Testing/Procedures: [place] [place]- Long Term Monitor Instructions  Your physician has requested you wear a ZIO patch monitor for 14 days.  This is a single patch monitor. [employer] supplies one patch monitor per enrollment. Additional stickers are not available. Please do not apply patch if you will be having a Nuclear Stress Test,  Echocardiogram, Cardiac CT, MRI, or Chest Xray during the period you would be wearing the  monitor. The patch cannot be worn during these tests. You cannot remove and re-apply the  ZIO XT patch monitor.  Your ZIO patch monitor will be mailed 3 day USPS to your address on file. It may take 3-5 days  to receive your monitor after you have been enrolled.  Once you have received your monitor, please review the enclosed instructions. Your monitor  has already been registered assigning a specific monitor serial # to you.  Billing and Patient Assistance Program Information  We have supplied [employer] with any of your insurance information on file for billing purposes. [employer] offers a sliding scale Patient Assistance Program for patients that do not have  insurance, or whose insurance does not completely cover the  cost of the ZIO monitor.  You must apply for the Patient Assistance Program to qualify for this discounted rate.  To apply, please call [employer] at [phone], select option 4, select option 2, ask to apply for  Patient Assistance Program. Theodore Demark will ask your household income, and how many people  are in your household. They will quote your out-of-pocket cost based on that information.  [employer] will also be able to set up a 53-month interest-free payment plan if needed.  Applying the monitor   Shave hair from upper left chest.  Hold abrader disc by orange tab. Rub abrader in 40 strokes over the upper left chest as  indicated in your monitor instructions.  Clean area with 4 enclosed alcohol pads. Let dry.  Apply patch as indicated in monitor instructions. Patch will be placed under collarbone on left  side of chest with arrow pointing upward.  Rub patch adhesive wings for 2 minutes. Remove white label marked "1". Remove the white  label marked "2". Rub patch adhesive wings for 2 additional minutes.  While looking in a mirror, press and release button in center of patch. A small green light will  flash 3-4 times. This will be your only indicator that the monitor has been turned on.  Do not shower for the first 24 hours. You may shower after the first 24 hours.  Press the button if you feel a symptom. You will hear a small click. Record Date, Time and  Symptom in the Patient Logbook.  When  you are ready to remove the patch, follow instructions on the last 2 pages of Patient  Logbook. Stick patch monitor onto the last page of Patient Logbook.  Place Patient Logbook in the blue and white box. Use locking tab on box and tape box closed  securely. The blue and white box has prepaid postage on it. Please place it in the mailbox as  soon as possible. Your physician should have your test results approximately 7 days after the  monitor has been mailed back to [facility].  Call [facility] at [phone] if you have questions regarding  your ZIO XT patch monitor. Call them immediately if you see an orange light blinking on your  monitor.  If your monitor falls off in less than 4 days, contact our Monitor department at [phone].  If your monitor becomes loose or falls off after 4 days call [employer] at [phone] for  suggestions on securing your monitor   Follow-Up: At [facility], you and your health needs are our priority.  As part of our continuing mission to provide you with exceptional heart care, we have created designated Provider Care Teams.  These Care Teams include your primary Cardiologist (physician) and Advanced Practice Providers (APPs -  Physician Assistants and Nurse Practitioners) who all work together to provide you with the care you need, when you need it.  We recommend signing up for the patient portal called "MyChart".  Sign up information is provided on this After Visit Summary.  MyChart is used to connect with patients for Virtual Visits (Telemedicine).  Patients are able to view lab/test results, encounter notes, upcoming appointments, etc.  Non-urgent messages can be sent to your provider as well.   To learn more about what you can do with MyChart, go to [email or web address].    Your next appointment:   3 MONTHS   The format for your next appointment:   In Person  Provider:   Candee Furbish, MD     Other Instructions Your physician has requested that you regularly monitor and record your blood pressure readings at home. Please use the same machine at the same time of day to check your readings and record them to bring to your follow-up visit.   Please monitor blood pressures and keep a log of your readings for 2 weeks then send a mychart message to me.    Make sure to check 2 hours after your medications.    AVOID these things for 30 minutes before checking your blood pressure: No Drinking caffeine. No Drinking  alcohol. No Eating. No Smoking. No Exercising.   Five minutes before checking your blood pressure: Pee. Sit in a dining chair. Avoid sitting in a soft couch or armchair. Be quiet. Do not talk   Important Information About Sugar

## 2021-12-01 NOTE — Progress Notes (Unsigned)
Enrolled for [employer] to mail a ZIO XT long term holter monitor to the patients address on file.   Dr. Skains to read. 

## 2021-12-02 ENCOUNTER — Encounter: Payer: Self-pay | Admitting: Nurse Practitioner

## 2021-12-02 DIAGNOSIS — N1831 Chronic kidney disease, stage 3a: Secondary | ICD-10-CM | POA: Insufficient documentation

## 2021-12-02 HISTORY — DX: Chronic kidney disease, stage 3a: N18.31

## 2021-12-02 LAB — COMPREHENSIVE METABOLIC PANEL
ALT: 27 IU/L (ref 0–44)
AST: 32 IU/L (ref 0–40)
Albumin/Globulin Ratio: 1.7 (ref 1.2–2.2)
Albumin: 4.1 g/dL (ref 3.7–4.7)
Alkaline Phosphatase: 122 IU/L — ABNORMAL HIGH (ref 44–121)
BUN/Creatinine Ratio: 9 — ABNORMAL LOW (ref 10–24)
BUN: 14 mg/dL (ref 8–27)
Bilirubin Total: 0.5 mg/dL (ref 0.0–1.2)
CO2: 22 mmol/L (ref 20–29)
Calcium: 8.6 mg/dL (ref 8.6–10.2)
Chloride: 104 mmol/L (ref 96–106)
Creatinine, Ser: 1.56 mg/dL — ABNORMAL HIGH (ref 0.76–1.27)
Globulin, Total: 2.4 g/dL (ref 1.5–4.5)
Glucose: 89 mg/dL (ref 70–99)
Potassium: 4.8 mmol/L (ref 3.5–5.2)
Sodium: 141 mmol/L (ref 134–144)
Total Protein: 6.5 g/dL (ref 6.0–8.5)
eGFR: 44 mL/min/{1.73_m2} — ABNORMAL LOW (ref >59)

## 2021-12-02 LAB — CBC WITH DIFFERENTIAL/PLATELET
Basophils Absolute: 0.1 10*3/uL (ref 0.0–0.2)
Basos: 1 %
EOS (ABSOLUTE): 0.1 10*3/uL (ref 0.0–0.4)
Eos: 3 %
Hematocrit: 40.5 % (ref 37.5–51.0)
Hemoglobin: 13.6 g/dL (ref 13.0–17.7)
Immature Grans (Abs): 0 10*3/uL (ref 0.0–0.1)
Immature Granulocytes: 0 %
Lymphocytes Absolute: 1.6 10*3/uL (ref 0.7–3.1)
Lymphs: 30 %
MCH: 31.1 pg (ref 26.6–33.0)
MCHC: 33.6 g/dL (ref 31.5–35.7)
MCV: 93 fL (ref 79–97)
Monocytes Absolute: 0.5 10*3/uL (ref 0.1–0.9)
Monocytes: 8 %
Neutrophils Absolute: 3.1 10*3/uL (ref 1.4–7.0)
Neutrophils: 58 %
Platelets: 151 10*3/uL (ref 150–450)
RBC: 4.37 x10E6/uL (ref 4.14–5.80)
RDW: 13 % (ref 11.6–15.4)
WBC: 5.4 10*3/uL (ref 3.4–10.8)

## 2021-12-02 LAB — TSH: TSH: 2.34 u[IU]/mL (ref 0.450–4.500)

## 2021-12-02 LAB — VITAMIN B12: Vitamin B-12: 411 pg/mL (ref 232–1245)

## 2021-12-03 DIAGNOSIS — G4733 Obstructive sleep apnea (adult) (pediatric): Secondary | ICD-10-CM

## 2021-12-03 DIAGNOSIS — Z9989 Dependence on other enabling machines and devices: Secondary | ICD-10-CM

## 2021-12-03 DIAGNOSIS — R001 Bradycardia, unspecified: Secondary | ICD-10-CM

## 2021-12-03 DIAGNOSIS — I10 Essential (primary) hypertension: Secondary | ICD-10-CM | POA: Diagnosis not present

## 2021-12-03 DIAGNOSIS — E785 Hyperlipidemia, unspecified: Secondary | ICD-10-CM | POA: Diagnosis not present

## 2021-12-03 DIAGNOSIS — I2510 Atherosclerotic heart disease of native coronary artery without angina pectoris: Secondary | ICD-10-CM | POA: Diagnosis not present

## 2021-12-05 ENCOUNTER — Other Ambulatory Visit: Payer: Medicare HMO

## 2021-12-05 DIAGNOSIS — R200 Anesthesia of skin: Secondary | ICD-10-CM

## 2021-12-05 DIAGNOSIS — R001 Bradycardia, unspecified: Secondary | ICD-10-CM

## 2021-12-05 DIAGNOSIS — E785 Hyperlipidemia, unspecified: Secondary | ICD-10-CM

## 2021-12-05 DIAGNOSIS — G4733 Obstructive sleep apnea (adult) (pediatric): Secondary | ICD-10-CM | POA: Diagnosis not present

## 2021-12-05 DIAGNOSIS — Z9989 Dependence on other enabling machines and devices: Secondary | ICD-10-CM | POA: Diagnosis not present

## 2021-12-05 DIAGNOSIS — R2242 Localized swelling, mass and lump, left lower limb: Secondary | ICD-10-CM | POA: Diagnosis not present

## 2021-12-05 DIAGNOSIS — I10 Essential (primary) hypertension: Secondary | ICD-10-CM

## 2021-12-05 DIAGNOSIS — I2510 Atherosclerotic heart disease of native coronary artery without angina pectoris: Secondary | ICD-10-CM

## 2021-12-05 DIAGNOSIS — R5383 Other fatigue: Secondary | ICD-10-CM

## 2021-12-05 LAB — LIPID PANEL
Chol/HDL Ratio: 3.1 ratio (ref 0.0–5.0)
Cholesterol, Total: 113 mg/dL (ref 100–199)
HDL: 37 mg/dL — ABNORMAL LOW (ref >39)
LDL Chol Calc (NIH): 63 mg/dL (ref 0–99)
Triglycerides: 55 mg/dL (ref 0–149)
VLDL Cholesterol Cal: 13 mg/dL (ref 5–40)

## 2021-12-19 DIAGNOSIS — I10 Essential (primary) hypertension: Secondary | ICD-10-CM

## 2021-12-20 MED ORDER — LISINOPRIL 40 MG PO TABS: 20.0000 mg | ORAL_TABLET | Freq: Every day | ORAL | 1 refills | Status: DC

## 2021-12-22 DIAGNOSIS — N183 Chronic kidney disease, stage 3 unspecified: Secondary | ICD-10-CM | POA: Diagnosis not present

## 2021-12-30 DIAGNOSIS — N1831 Chronic kidney disease, stage 3a: Secondary | ICD-10-CM | POA: Diagnosis not present

## 2021-12-30 DIAGNOSIS — D631 Anemia in chronic kidney disease: Secondary | ICD-10-CM | POA: Diagnosis not present

## 2021-12-30 DIAGNOSIS — I129 Hypertensive chronic kidney disease with stage 1 through stage 4 chronic kidney disease, or unspecified chronic kidney disease: Secondary | ICD-10-CM | POA: Diagnosis not present

## 2021-12-30 DIAGNOSIS — N281 Cyst of kidney, acquired: Secondary | ICD-10-CM | POA: Diagnosis not present

## 2021-12-30 DIAGNOSIS — N2889 Other specified disorders of kidney and ureter: Secondary | ICD-10-CM | POA: Diagnosis not present

## 2022-01-02 DIAGNOSIS — G4733 Obstructive sleep apnea (adult) (pediatric): Secondary | ICD-10-CM | POA: Diagnosis not present

## 2022-01-02 DIAGNOSIS — I10 Essential (primary) hypertension: Secondary | ICD-10-CM | POA: Diagnosis not present

## 2022-01-02 DIAGNOSIS — I2510 Atherosclerotic heart disease of native coronary artery without angina pectoris: Secondary | ICD-10-CM | POA: Diagnosis not present

## 2022-01-12 ENCOUNTER — Telehealth (HOSPITAL_BASED_OUTPATIENT_CLINIC_OR_DEPARTMENT_OTHER): Payer: Self-pay | Admitting: Nurse Practitioner

## 2022-01-12 NOTE — Telephone Encounter (Deleted)
S/W patient and updated him about 14-day ZIO results.  Overall unremarkable monitor results, brief episode of was stated to be Mobitz type I, and idioventricular rhythm, was asymptomatic with this. I consulted Richardson Dopp, PA-C who stated Amlodipine does not need to be discontinued since episodes were very brief. Patient states he feels better than from appointment last month, blood pressures have improved after cutting back on lisinopril dose 40 mg to 20 mg daily.  Discussed to continue checking blood pressures at home and to let us know if his symptoms change or worsen.  He verbalized understanding.  He will follow-up with Dr. Marlou Porch in October. He verbalized understanding and he was appreciative of my call.   Finis Bud, NP

## 2022-01-12 NOTE — Telephone Encounter (Signed)
S/W patient and updated him about 14-day ZIO results.  Overall unremarkable monitor results, brief episode of was stated to be Mobitz type I, and idioventricular rhythm, was asymptomatic with this. I consulted Richardson Dopp, PA-C who stated Amlodipine does not need to be discontinued since episodes were very brief. Patient states he feels better than from appointment last month, blood pressures have improved after cutting back on lisinopril dose 40 mg to 20 mg daily.  Discussed to continue checking blood pressures at home and to let us know if his symptoms change or worsen.  He verbalized understanding.  He will follow-up with Dr. Marlou Porch in October. He verbalized understanding and he was appreciative of my call.   Finis Bud, NP

## 2022-01-12 NOTE — Telephone Encounter (Deleted)
S/W patient and updated him about 14-day ZIO results.  Overall unremarkable monitor results, brief episode of was stated to be Mobitz type I, and idioventricular rhythm, was asymptomatic with this.  States he feels better than from appointment last month, blood pressures have improved after cutting back on lisinopril dose 40 mg to 20 mg daily.  Discussed to continue checking blood pressures at home and to let us know if his symptoms change or worsen.  He verbalized understanding.  He will follow-up with Dr. Marlou Porch in October. He verbalized understanding and he was appreciative of my call.  Finis Bud, NP

## 2022-02-24 DIAGNOSIS — N281 Cyst of kidney, acquired: Secondary | ICD-10-CM | POA: Diagnosis not present

## 2022-02-24 DIAGNOSIS — D3002 Benign neoplasm of left kidney: Secondary | ICD-10-CM | POA: Diagnosis not present

## 2022-02-24 DIAGNOSIS — I728 Aneurysm of other specified arteries: Secondary | ICD-10-CM | POA: Diagnosis not present

## 2022-02-24 DIAGNOSIS — N200 Calculus of kidney: Secondary | ICD-10-CM | POA: Diagnosis not present

## 2022-02-24 DIAGNOSIS — C642 Malignant neoplasm of left kidney, except renal pelvis: Secondary | ICD-10-CM | POA: Diagnosis not present

## 2022-03-02 ENCOUNTER — Encounter: Payer: Self-pay | Admitting: Cardiology

## 2022-03-02 ENCOUNTER — Ambulatory Visit: Payer: Medicare HMO | Attending: Cardiology | Admitting: Cardiology

## 2022-03-02 VITALS — BP 120/60 | HR 98 | Ht 65.0 in | Wt 184.0 lb

## 2022-03-02 DIAGNOSIS — I2510 Atherosclerotic heart disease of native coronary artery without angina pectoris: Secondary | ICD-10-CM

## 2022-03-02 DIAGNOSIS — R5383 Other fatigue: Secondary | ICD-10-CM | POA: Diagnosis not present

## 2022-03-02 DIAGNOSIS — E785 Hyperlipidemia, unspecified: Secondary | ICD-10-CM

## 2022-03-02 NOTE — Progress Notes (Signed)
Cardiology Office Note:    Date:  03/02/2022   ID:  Bruce [name], DOB 1935/07/26, MRN [id number]  PCP:  Kristen Loader, FNP   [facility] Providers Cardiologist:  Candee Furbish, MD     Referring MD: Kristen Loader, FNP    History of Present Illness:    Bruce [name] is a 86 y.o. male here for the follow-up of hypertension and ASCVD.  He followed up with Finis Bud, NP on 12/01/2021 where he reported heart rates dropping to the 40's while sleeping via his Apple Watch. He was using his CPAP every night. No abnormal heart rates during the day. A few days earlier he noticed diastolic blood pressures in the 50's with some dizziness. He wore a 14 day Zio monitor which revealed sinus rhythm with first degree AV block, rare short bursts of atrial tachycardia - benign, rare PAC/PVC's, brief second degree HB type 1 (Wenckebach) was present, bradycardia (40's) noted during sleep - asymptomatic. No indication for pacemaker at this time.  Has coronary disease s/p prior MI.  He had slow heart rate dizziness and was seen on 04/21/2021 by DOD, Dr. Acie Fredrickson at the time.  The room was spinning when he lays back sounded like vertigo.  Felt dizzy and poor.  His actual heart rate was 60.  Back in 2002 had a silent MI and had stents placed.  [facility] 2019 stress test was normal.  His blood pressure was 120/70 in December and because of this on the lower side his Cardura was reduced to 4 mg.  His blood pressure did increase and we increased it back to 8 mg.  At his last appointment he was doing well without chest pain. It was noted that he did have bronchitis shortly after his dizzy spell.  This may have been vertigo.  Today: Lately he complains of low energy levels. If he tries to go outside and work in the yard he will feel run-down very quickly. He would attribute his symptoms to his age, but this is still bothersome.  No bleeding issues.  He denies any palpitations, chest pain, shortness of  breath, or peripheral edema. No lightheadedness, headaches, syncope, orthopnea, or PND.  Tomorrow he follows up with nephrology regarding potential kidney issues.   Past Medical History:  Diagnosis Date   Arthritis    Coronary artery disease    ETT-Myoview EF 51%, inf infarct, no ischemia, low risk   History of echocardiogram    Echo 3/17: EF 55-60%, inferior HK, grade 1 diastolic dysfunctione   History of kidney stones    Hypertension    Macular degeneration    Myocardial infarction ([place]) 2000   with stent   PONV (postoperative nausea and vomiting)    Sleep apnea    cpap in use    Stage 3a chronic kidney disease (CKD) ([place]) 12/02/2021    Past Surgical History:  Procedure Laterality Date   CORONARY ANGIOPLASTY     EYE SURGERY     bilateral cataract surgery with lens implant   KNEE SURGERY Right    LEFT HEART CATHETERIZATION WITH CORONARY ANGIOGRAM N/A 07/01/2011   Procedure: LEFT HEART CATHETERIZATION WITH CORONARY ANGIOGRAM;  Surgeon: Candee Furbish, MD;  Location: [facility] CATH LAB;  Service: Cardiovascular;  Laterality: N/A;   left wrist surgery     left wrist fracture   PARTIAL KNEE ARTHROPLASTY Left 08/02/2017   Procedure: LEFT UNICOMPARTMENTAL KNEE;  Surgeon: Paralee Cancel, MD;  Location: [facility] ORS;  Service: Orthopedics;  Laterality: Left;   PARTIAL KNEE ARTHROPLASTY Right 03/02/2019   Procedure: UNICOMPARTMENTAL KNEE Medially;  Surgeon: Paralee Cancel, MD;  Location: [facility] ORS;  Service: Orthopedics;  Laterality: Right;  90 mins    Current Medications: Current Meds  Medication Sig   amLODipine (NORVASC) 10 MG tablet Take 10 mg by mouth daily.   amoxicillin (AMOXIL) 500 MG capsule amoxicillin 500 mg capsule  TAKE 4 CAPSULES BY MOUTH 1 HOUR PRIOR TO DENTAL PROCEDURE   aspirin 81 MG chewable tablet aspirin 81 mg chewable tablet  Chew 1 tablet every day by oral route.   atorvastatin (LIPITOR) 80 MG tablet Take 80 mg by mouth daily.   beta carotene w/minerals (OCUVITE) tablet Take 1  tablet by mouth daily.   doxazosin (CARDURA) 8 MG tablet Take 8 mg by mouth daily.   lisinopril (ZESTRIL) 20 MG tablet Take 20 mg by mouth daily.   Multiple Vitamin (MULTIVITAMIN WITH MINERALS) TABS tablet Take 1 tablet by mouth daily.   omeprazole (PRILOSEC) 40 MG capsule Take 40 mg by mouth daily.   vitamin B-12 (CYANOCOBALAMIN) 1000 MCG tablet Take 1,000 mcg by mouth daily.     Allergies:   Clopidogrel bisulfate, Nsaids, Other, Clopidogrel, Methyldopa, Plavix [clopidogrel bisulfate], and Spironolactone   Social History   Socioeconomic History   Marital status: Married    Spouse name: Not on file   Number of children: Not on file   Years of education: Not on file   Highest education level: Not on file  Occupational History   Not on file  Tobacco Use   Smoking status: Never   Smokeless tobacco: Never  Vaping Use   Vaping Use: Never used  Substance and Sexual Activity   Alcohol use: No   Drug use: No   Sexual activity: Yes  Other Topics Concern   Not on file  Social History Narrative   Not on file   Social Determinants of Health   Financial Resource Strain: Not on file  Food Insecurity: Not on file  Transportation Needs: Not on file  Physical Activity: Not on file  Stress: Not on file  Social Connections: Not on file     Family History: The patient's family history includes Coronary artery disease in his brother and father; Diabetes in his brother; Heart attack in his sister; Heart disease in his brother; Hyperlipidemia in his sister; Kidney disease in his sister.  ROS:   Please see the history of present illness. (+) Fatigue/Malaise All other systems are reviewed and negative.    EKGs/Labs/Other Studies Reviewed:    The following studies were reviewed today:  Monitor  12/2021:   Sinus rhythm with first degree AV block.   Rare short bursts of atrial tachycardia - benign   Rare PAC/PVC's   Brief second degree HB type 1 (Wenckebach) was present   Bradycardia  (40's) noted during sleep - asymptomatic. No indication for pacemaker at this time.     Patch Wear Time:  14 days and 0 hours (2023-07-19T20:23:38-0400 to 2023-08-02T20:23:42-0400)   Patient had a min HR of 28 bpm, max HR of 171 bpm, and avg HR of 65 bpm. Predominant underlying rhythm was Sinus Rhythm. First Degree AV Block was present. 1 run of Ventricular Tachycardia occurred lasting 4 beats with a max rate of 171 bpm (avg 159  bpm). 4 Supraventricular Tachycardia runs occurred, the run with the fastest interval lasting 7 beats with a max rate of 141 bpm (avg 129 bpm); the run with the fastest interval  was also the longest. Idioventricular Rhythm was present. Second Degree AV  Block-Mobitz I (Wenckebach) was present. Isolated SVEs were rare (<1.0%), SVE Couplets were rare (<1.0%), and SVE Triplets were rare (<1.0%). Isolated VEs were rare (<1.0%, 4028), VE Couplets were rare (<1.0%, 15), and VE Triplets were rare (<1.0%, 4).  Echocardiogram 05/15/2021: normal EF grade 1 diastolic dysfunction no significant valvular abnormalities  EKG:  EKG is personally reviewed. 03/02/2022:  EKG was not ordered. 12/01/2021 Finis Bud, NP):  NSR, with findings of previous old inferior infarct, otherwise no acute changes since last EKG.   Recent Labs: 12/01/2021: ALT 27; BUN 14; Creatinine, Ser 1.56; Hemoglobin 13.6; Platelets 151; Potassium 4.8; Sodium 141; TSH 2.340   Recent Lipid Panel    Component Value Date/Time   CHOL 113 12/05/2021 0844   TRIG 55 12/05/2021 0844   HDL 37 (L) 12/05/2021 0844   CHOLHDL 3.1 12/05/2021 0844   CHOLHDL 3.1 04/30/2009 0535   VLDL 14 04/30/2009 0535   LDLCALC 63 12/05/2021 0844     Risk Assessment/Calculations:              Physical Exam:    VS:  BP 120/60 (BP Location: Left Arm, Patient Position: Sitting, Cuff Size: Normal)   Pulse 98   Ht '5\' 5"'$  (1.651 m)   Wt 184 lb (83.5 kg)   SpO2 95%   BMI 30.62 kg/m     Wt Readings from Last 3 Encounters:   03/02/22 184 lb (83.5 kg)  12/01/21 183 lb 3.2 oz (83.1 kg)  06/04/21 175 lb (79.4 kg)     GEN:  Well nourished, well developed in no acute distress HEENT: Normal NECK: No JVD; No carotid bruits LYMPHATICS: No lymphadenopathy CARDIAC: RRR, no murmurs, no rubs, gallops RESPIRATORY:  Clear to auscultation without rales, wheezing or rhonchi  ABDOMEN: Soft, non-tender, non-distended MUSCULOSKELETAL:  No edema; No deformity  SKIN: Warm and dry NEUROLOGIC:  Alert and oriented x 3 PSYCHIATRIC:  Normal affect   ASSESSMENT:    1. Coronary artery disease involving native heart without angina pectoris, unspecified vessel or lesion type   2. Other fatigue   3. Dyslipidemia     PLAN:    In order of problems listed above:  ASCVD (arteriosclerotic cardiovascular disease) Previously placed stent.  No chest pain.  Doing well.  Continue with aspirin 81 mg, statin, good blood pressure control.  I encouraged daily exercise.  He has felt some decreased energy while working outside.  Continue encourage conditioning.   Dyslipidemia Continue with atorvastatin 80 mg a day.  Last LDL 63  Excellent.  No myalgias on this medical management.   Essential hypertension, benign He is back up on his Cardura 8 mg as well as lisinopril 20 mg at bedtime and Norvasc 10 mg a day.  Excellent control.  No changes made.  Blood pressure at home has been normal.   Vertigo Thankfully, this has passed.  Chronic kidney disease stage IIIa-creatinine 1.4.  Try to avoid NSAIDs.   Handicap placard has been given.  Challenging for him to walk 200 feet without having to stop.  Follow-up:  1 year.  Medication Adjustments/Labs and Tests Ordered: Current medicines are reviewed at length with the patient today.  Concerns regarding medicines are outlined above.   No orders of the defined types were placed in this encounter.  No orders of the defined types were placed in this encounter.  Patient Instructions   Medication Instructions:  The current medical regimen is effective;  continue present plan and medications.  *If you need a refill on your cardiac medications before your next appointment, please call your pharmacy*  Follow-Up: At [facility], you and your health needs are our priority.  As part of our continuing mission to provide you with exceptional heart care, we have created designated Provider Care Teams.  These Care Teams include your primary Cardiologist (physician) and Advanced Practice Providers (APPs -  Physician Assistants and Nurse Practitioners) who all work together to provide you with the care you need, when you need it.  We recommend signing up for the patient portal called "MyChart".  Sign up information is provided on this After Visit Summary.  MyChart is used to connect with patients for Virtual Visits (Telemedicine).  Patients are able to view lab/test results, encounter notes, upcoming appointments, etc.  Non-urgent messages can be sent to your provider as well.   To learn more about what you can do with MyChart, go to [email or web address].    Your next appointment:   1 year(s)  The format for your next appointment:   In Person  Provider:   Candee Furbish, MD      Important Information About Sugar         I,Mathew Stumpf,acting as a scribe for Candee Furbish, MD.,have documented all relevant documentation on the behalf of Candee Furbish, MD,as directed by  Candee Furbish, MD while in the presence of Candee Furbish, MD.  I, Candee Furbish, MD, have reviewed all documentation for this visit. The documentation on 03/02/22 for the exam, diagnosis, procedures, and orders are all accurate and complete.   Signed, Candee Furbish, MD  03/02/2022 10:45 AM    [facility]

## 2022-03-02 NOTE — Patient Instructions (Signed)
Medication Instructions:  The current medical regimen is effective;  continue present plan and medications.  *If you need a refill on your cardiac medications before your next appointment, please call your pharmacy*  Follow-Up: At [facility], you and your health needs are our priority.  As part of our continuing mission to provide you with exceptional heart care, we have created designated Provider Care Teams.  These Care Teams include your primary Cardiologist (physician) and Advanced Practice Providers (APPs -  Physician Assistants and Nurse Practitioners) who all work together to provide you with the care you need, when you need it.  We recommend signing up for the patient portal called "MyChart".  Sign up information is provided on this After Visit Summary.  MyChart is used to connect with patients for Virtual Visits (Telemedicine).  Patients are able to view lab/test results, encounter notes, upcoming appointments, etc.  Non-urgent messages can be sent to your provider as well.   To learn more about what you can do with MyChart, go to [email or web address].    Your next appointment:   1 year(s)  The format for your next appointment:   In Person  Provider:   Mark Skains, MD      Important Information About Sugar       

## 2022-03-03 DIAGNOSIS — N200 Calculus of kidney: Secondary | ICD-10-CM | POA: Diagnosis not present

## 2022-03-03 DIAGNOSIS — D49511 Neoplasm of unspecified behavior of right kidney: Secondary | ICD-10-CM | POA: Diagnosis not present

## 2022-03-03 DIAGNOSIS — R351 Nocturia: Secondary | ICD-10-CM | POA: Diagnosis not present

## 2022-03-19 DIAGNOSIS — R7303 Prediabetes: Secondary | ICD-10-CM | POA: Diagnosis not present

## 2022-03-19 DIAGNOSIS — N400 Enlarged prostate without lower urinary tract symptoms: Secondary | ICD-10-CM | POA: Diagnosis not present

## 2022-03-19 DIAGNOSIS — I25119 Atherosclerotic heart disease of native coronary artery with unspecified angina pectoris: Secondary | ICD-10-CM | POA: Diagnosis not present

## 2022-03-19 DIAGNOSIS — Z23 Encounter for immunization: Secondary | ICD-10-CM | POA: Diagnosis not present

## 2022-03-19 DIAGNOSIS — N183 Chronic kidney disease, stage 3 unspecified: Secondary | ICD-10-CM | POA: Diagnosis not present

## 2022-03-19 DIAGNOSIS — E7800 Pure hypercholesterolemia, unspecified: Secondary | ICD-10-CM | POA: Diagnosis not present

## 2022-03-19 DIAGNOSIS — Z0000 Encounter for general adult medical examination without abnormal findings: Secondary | ICD-10-CM | POA: Diagnosis not present

## 2022-03-19 DIAGNOSIS — Z6830 Body mass index (BMI) 30.0-30.9, adult: Secondary | ICD-10-CM | POA: Diagnosis not present

## 2022-03-19 DIAGNOSIS — I10 Essential (primary) hypertension: Secondary | ICD-10-CM | POA: Diagnosis not present

## 2022-06-08 ENCOUNTER — Encounter (INDEPENDENT_AMBULATORY_CARE_PROVIDER_SITE_OTHER): Payer: Medicare HMO | Admitting: Ophthalmology

## 2022-06-08 DIAGNOSIS — I10 Essential (primary) hypertension: Secondary | ICD-10-CM

## 2022-06-08 DIAGNOSIS — H33302 Unspecified retinal break, left eye: Secondary | ICD-10-CM

## 2022-06-08 DIAGNOSIS — H353132 Nonexudative age-related macular degeneration, bilateral, intermediate dry stage: Secondary | ICD-10-CM | POA: Diagnosis not present

## 2022-06-08 DIAGNOSIS — H43813 Vitreous degeneration, bilateral: Secondary | ICD-10-CM | POA: Diagnosis not present

## 2022-06-08 DIAGNOSIS — H35033 Hypertensive retinopathy, bilateral: Secondary | ICD-10-CM | POA: Diagnosis not present

## 2022-06-17 DIAGNOSIS — H52209 Unspecified astigmatism, unspecified eye: Secondary | ICD-10-CM | POA: Diagnosis not present

## 2022-06-17 DIAGNOSIS — H524 Presbyopia: Secondary | ICD-10-CM | POA: Diagnosis not present

## 2022-06-17 DIAGNOSIS — H5203 Hypermetropia, bilateral: Secondary | ICD-10-CM | POA: Diagnosis not present

## 2022-06-17 DIAGNOSIS — H5212 Myopia, left eye: Secondary | ICD-10-CM | POA: Diagnosis not present

## 2022-07-29 DIAGNOSIS — G4733 Obstructive sleep apnea (adult) (pediatric): Secondary | ICD-10-CM | POA: Diagnosis not present

## 2022-09-18 DIAGNOSIS — M5451 Vertebrogenic low back pain: Secondary | ICD-10-CM | POA: Diagnosis not present

## 2022-09-18 DIAGNOSIS — M25552 Pain in left hip: Secondary | ICD-10-CM | POA: Diagnosis not present

## 2022-10-05 DIAGNOSIS — M79605 Pain in left leg: Secondary | ICD-10-CM | POA: Diagnosis not present

## 2022-10-05 DIAGNOSIS — M25552 Pain in left hip: Secondary | ICD-10-CM | POA: Diagnosis not present

## 2022-10-05 DIAGNOSIS — I700 Atherosclerosis of aorta: Secondary | ICD-10-CM | POA: Diagnosis not present

## 2022-10-05 DIAGNOSIS — M545 Low back pain, unspecified: Secondary | ICD-10-CM | POA: Insufficient documentation

## 2022-10-15 DIAGNOSIS — M545 Low back pain, unspecified: Secondary | ICD-10-CM | POA: Diagnosis not present

## 2022-11-02 DIAGNOSIS — M5416 Radiculopathy, lumbar region: Secondary | ICD-10-CM | POA: Diagnosis not present

## 2022-11-02 DIAGNOSIS — M5136 Other intervertebral disc degeneration, lumbar region: Secondary | ICD-10-CM | POA: Diagnosis not present

## 2022-11-04 DIAGNOSIS — G4731 Primary central sleep apnea: Secondary | ICD-10-CM | POA: Diagnosis not present

## 2022-11-06 DIAGNOSIS — X32XXXD Exposure to sunlight, subsequent encounter: Secondary | ICD-10-CM | POA: Diagnosis not present

## 2022-11-06 DIAGNOSIS — L570 Actinic keratosis: Secondary | ICD-10-CM | POA: Diagnosis not present

## 2022-11-06 DIAGNOSIS — D225 Melanocytic nevi of trunk: Secondary | ICD-10-CM | POA: Diagnosis not present

## 2022-11-09 ENCOUNTER — Other Ambulatory Visit: Payer: Self-pay | Admitting: Cardiology

## 2022-11-09 MED ORDER — LISINOPRIL 20 MG PO TABS: 20.0000 mg | ORAL_TABLET | Freq: Every day | ORAL | 1 refills | Status: DC

## 2022-11-17 DIAGNOSIS — M5416 Radiculopathy, lumbar region: Secondary | ICD-10-CM | POA: Diagnosis not present

## 2022-11-18 ENCOUNTER — Telehealth: Payer: Self-pay | Admitting: Cardiology

## 2022-11-18 NOTE — Telephone Encounter (Signed)
Bruce Bathe, MD  You; Rodman Key E, RN2 hours ago (10:31 AM)    Recent ZIO monitor last year showed the same findings.  No indication for pacemaker since this is happening during sleep.  Excellent.    Pt is aware of the above information.  He had not further questions at the time of the call but will reach back out if any questions or concerns.

## 2022-11-18 NOTE — Telephone Encounter (Signed)
Spoke with pt who reports he kept getting Apple Watch alerts during the night for heart rates 30-40.  He is concerned because he gets alerts at night time for low heart rate but never this low.  Pt denies any symptoms during these alerts.  Pt states his current HR is 73.  He is taking medications as prescribed.  He has not taken his BP this morning.  He states he did have a steroid injection in his low back yesterday for back pain and wonders if that possibly would affect heart rate. Pt advised will forward information to Dr Anne Fu for review.  Pt verbalizes understanding and thanked Charity fundraiser for the callback.

## 2022-11-18 NOTE — Telephone Encounter (Signed)
STAT if HR is under 50 or over 120 (normal HR is 60-100 beats per minute)  What is your heart rate? Last night while he was sleeping, kept getting alert that his heart rate was ranging from 30 something to 40 something. This morning it is 50  Do you have a log of your heart rate readings (document readings)?   Do you have any other symptoms? No other symptomsPatient said he had a Steroid injection in his back yesterday, wonder if that effected his heart rate

## 2022-11-22 IMAGING — CT CT ANGIO CHEST-ABD-PELV FOR DISSECTION W/ AND WO/W CM
2 of 7 series · 13 of 46 positions shown, 15 images · non-contrast
Comparison: CT abdomen pelvis 01/04/2012

CLINICAL DATA: Abdominal pain, aortic dissection

EXAM:
CT ANGIOGRAPHY CHEST, ABDOMEN AND PELVIS
TECHNIQUE: Non-contrast CT of the chest was initially obtained.

[Series 6: arterial · axial · arterial · 0.98mm/px · z∈[+1010,+1636]mm · 10 of 353 slices shown, 12 images]
[im 20/353  soft-tissue]
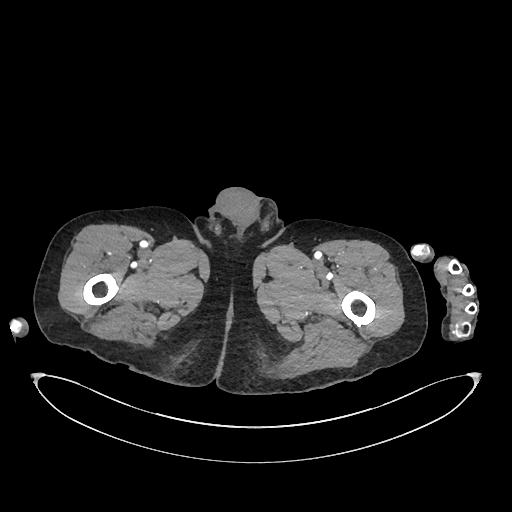
[im 20/353  bone]
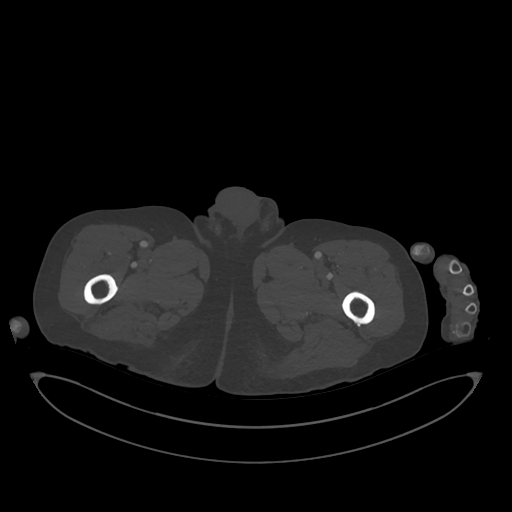
[im 59/353  soft-tissue]
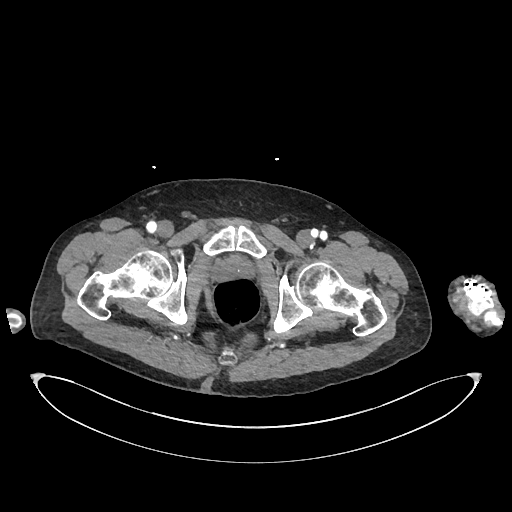
[im 98/353  soft-tissue]
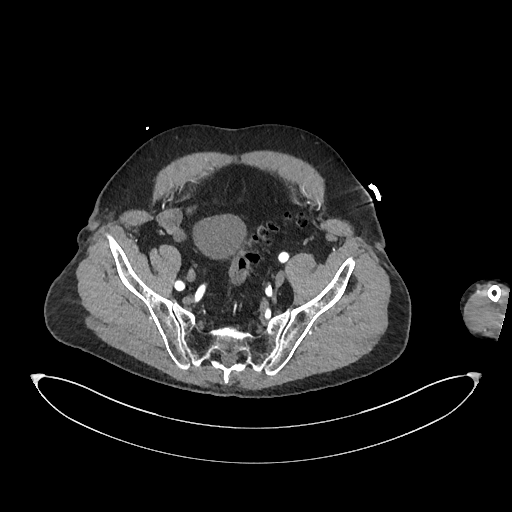
[im 118/353  soft-tissue]
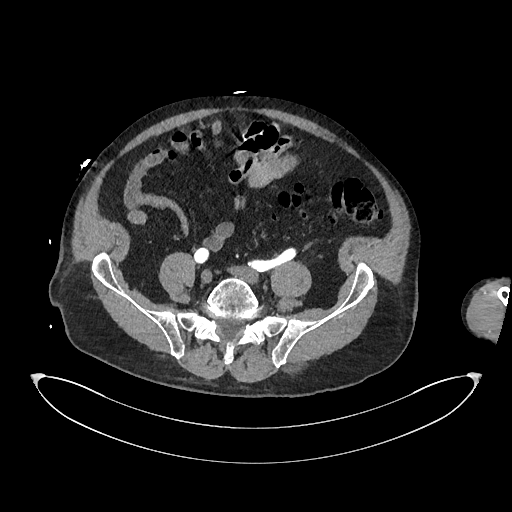
[im 157/353  soft-tissue]
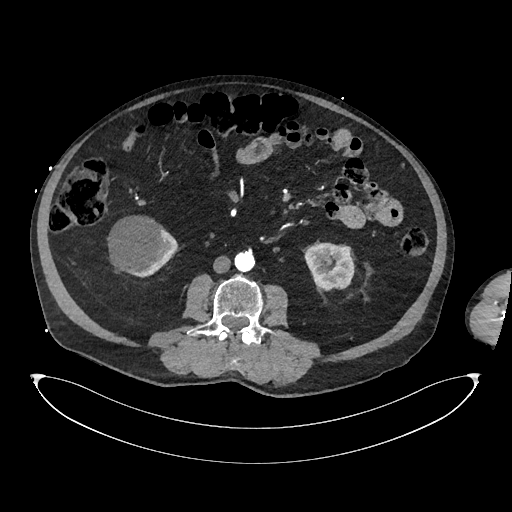
[im 196/353  soft-tissue]
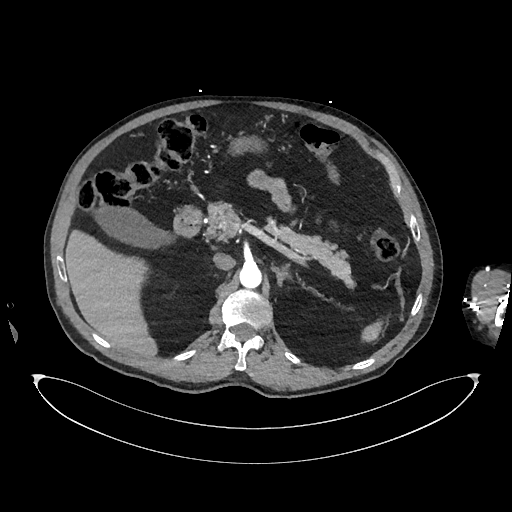
[im 235/353  soft-tissue]
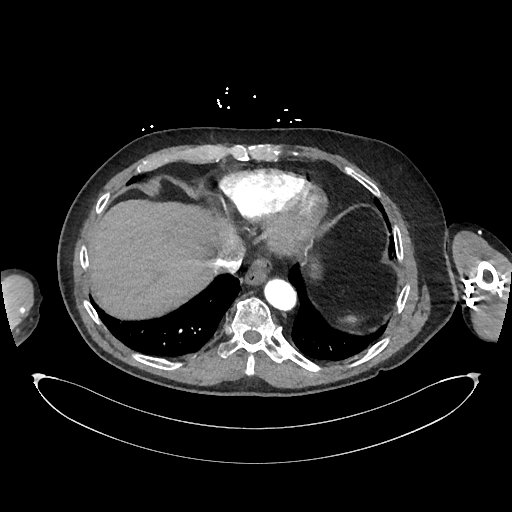
[im 255/353  soft-tissue]
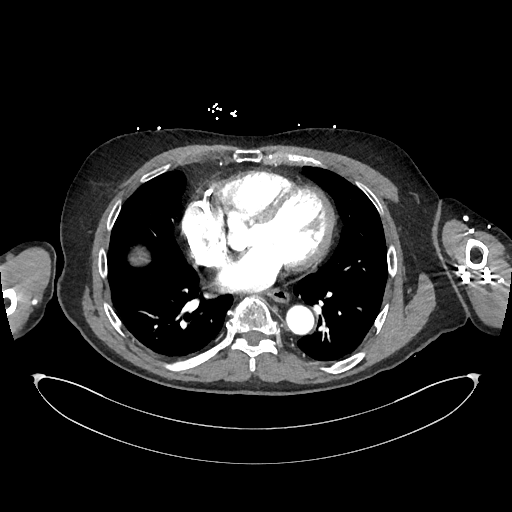
[im 294/353  soft-tissue]
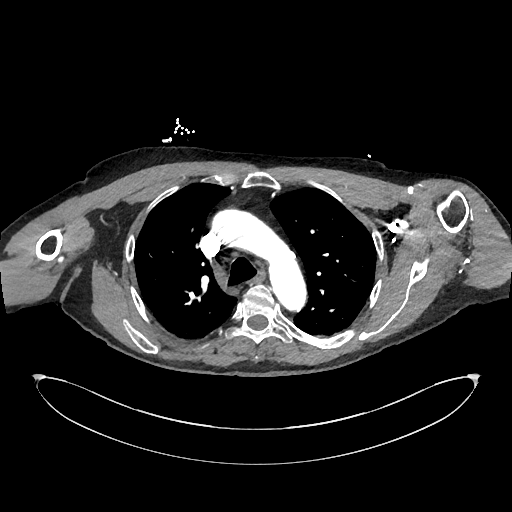
[im 294/353  bone]
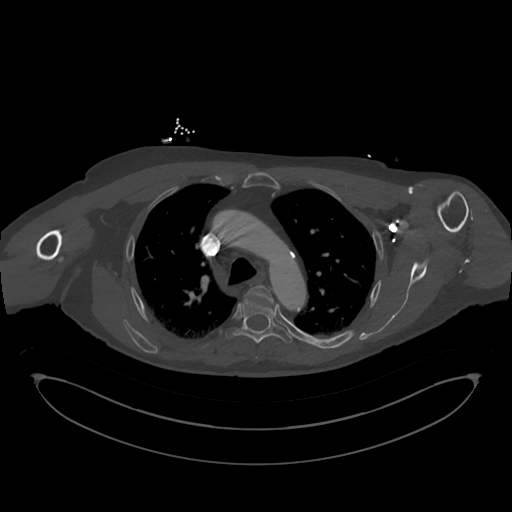
[im 333/353  soft-tissue]
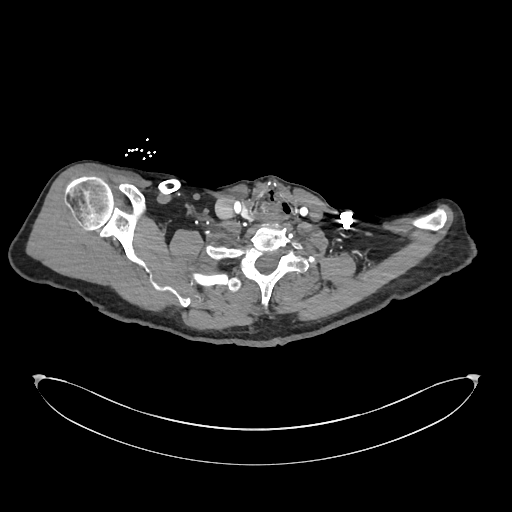

[Series 9: cor · coronal · 0.91mm/px · 3 of 159 slices shown]
[im 40/159  soft-tissue]
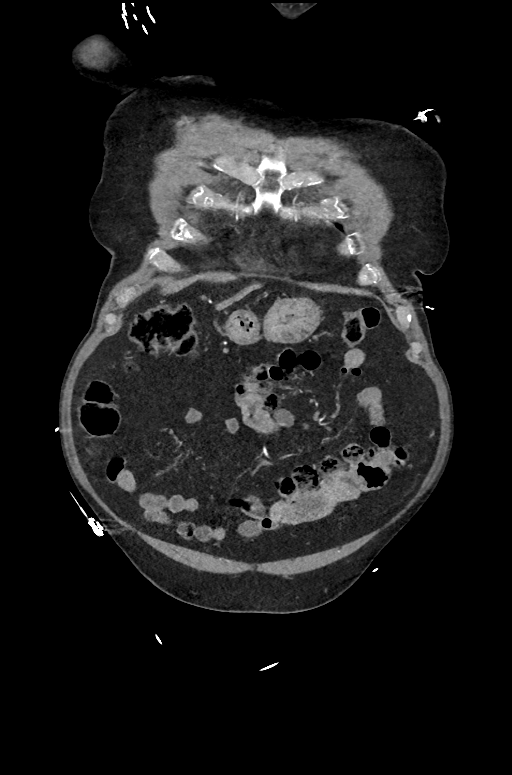
[im 80/159  soft-tissue]
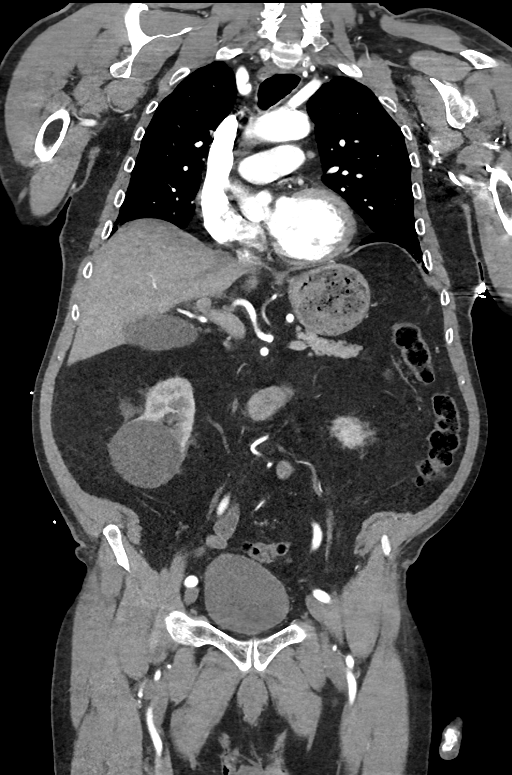
[im 119/159  soft-tissue]
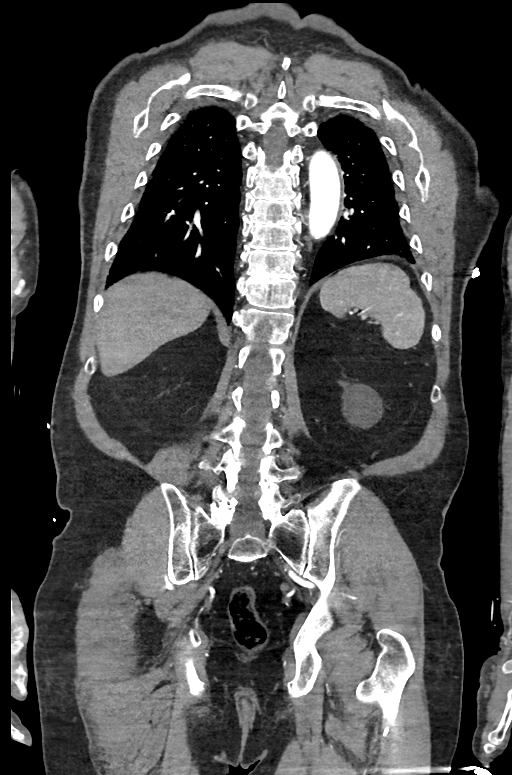

[13 of 46 positions shown; findings below may reference images not displayed]

Multidetector CT imaging through the chest, abdomen and pelvis was
performed using the standard protocol during bolus administration of
intravenous contrast. Multiplanar reconstructed images and MIPs were
obtained and reviewed to evaluate the vascular anatomy.

CONTRAST:  100mL OMNIPAQUE IOHEXOL 350 MG/ML SOLN
FINDINGS: CTA CHEST FINDINGS

Cardiovascular: The thoracic aorta is of normal caliber. No aneurysm
or dissection. Mild atherosclerotic calcification. Bovine arch
anatomy with wide patency of the arch vasculature proximally.

Right coronary artery stenting has been performed. Moderate
multi-vessel coronary artery calcification. Global cardiac size is
within normal limits. No pericardial effusion. Central pulmonary
arteries are enlarged in keeping with changes of pulmonary arterial
hypertension.

Mediastinum/Nodes: Thyroid unremarkable. No pathologic thoracic
adenopathy. Small hiatal hernia. Esophagus is unremarkable.

Lungs/Pleura: Mild bibasilar cylindrical bronchiectasis,
nonspecific. No focal pulmonary infiltrate. No focal pulmonary
nodule. No pneumothorax or pleural effusion. Central airways are
widely patent.

Musculoskeletal: No acute bone abnormality. No lytic or blastic bone
lesion.

Review of the MIP images confirms the above findings.

CTA ABDOMEN AND PELVIS FINDINGS

VASCULAR

Aorta: Moderate atherosclerotic calcification. No aneurysm or
dissection. No hemodynamically significant stenosis. No periaortic
inflammatory change.

Celiac: Widely patent. Normal anatomic configuration. Thrombosed 10
mm distal splenic artery aneurysm. No additional aneurysm or
dissection.

SMA: Widely patent.  No aneurysm or dissection.

Renals: Single renal arteries bilaterally. Less than 50% stenoses of
the renal artery origins bilaterally. Beaded appearance of the a mid
segment of the right renal artery in keeping with changes of
fibromuscular dysplasia. Possible non flow limiting dissection flap
within the proximal and mid right renal artery. The left renal
artery demonstrates normal vascular morphology. No aneurysm.

IMA: Less than 50% stenosis of the origin.  Distally widely patent.

Inflow: Mild atherosclerotic calcification. Widely patent. No
aneurysm or dissection. Internal iliac arteries are patent
bilaterally.

Veins: No obvious venous abnormality within the limitations of this
arterial phase study.

Review of the MIP images confirms the above findings.

NON-VASCULAR

Hepatobiliary: No focal liver abnormality is seen. No gallstones,
gallbladder wall thickening, or biliary dilatation.

Pancreas: Unremarkable

Spleen: Unremarkable

Adrenals/Urinary Tract: The adrenal glands are unremarkable. The
kidneys are normal in size and position. Multiple simple exophytic
cortical cysts are seen arising from the lower pole the right kidney
measuring 7 cm and the interpolar region of the left kidney
measuring 4.5 cm. A 7 mm exophytic enhancing lesion is seen arising
from the upper pole of the left kidney likely representing a tiny
renal cell carcinoma. No hydronephrosis. No intrarenal or ureteral
calculi. The bladder is unremarkable.

Stomach/Bowel: Extensive sigmoid diverticulosis. The stomach, small
bowel, and large bowel are otherwise unremarkable. Appendix normal.
No free intraperitoneal gas or fluid.

Lymphatic: No pathologic adenopathy within the abdomen and pelvis.

Reproductive: Prostate is unremarkable.

Other: Small fat containing umbilical hernia. The rectum is
unremarkable.

Musculoskeletal: No acute bone abnormality. No lytic or blastic bone
lesions. Degenerative changes are seen within the lumbar spine.

Review of the MIP images confirms the above findings.
IMPRESSION: No evidence of a thoracoabdominal aortic aneurysm or dissection.

Moderate multi-vessel coronary artery calcification. Morphologic
changes in keeping with pulmonary arterial hypertension.

Fibromuscular dysplasia involving the right renal artery. Possible
non flow limiting dissection involving the proximal and mid segment
of the solitary right renal artery. Normal enhancement and preserved
cortical thickness of the right kidney.

7 mm probable renal cell carcinoma involving the upper pole of the
left kidney.

Extensive sigmoid diverticulosis without superimposed inflammatory
change.

Aortic Atherosclerosis ([id number]).

## 2022-11-22 IMAGING — DX DG CHEST 2V
2 series · 2 of 2 positions shown · non-contrast
Comparison: 04/04/2018

CLINICAL DATA: Chest pain

EXAM:
CHEST - 2 VIEW

[chest pa]
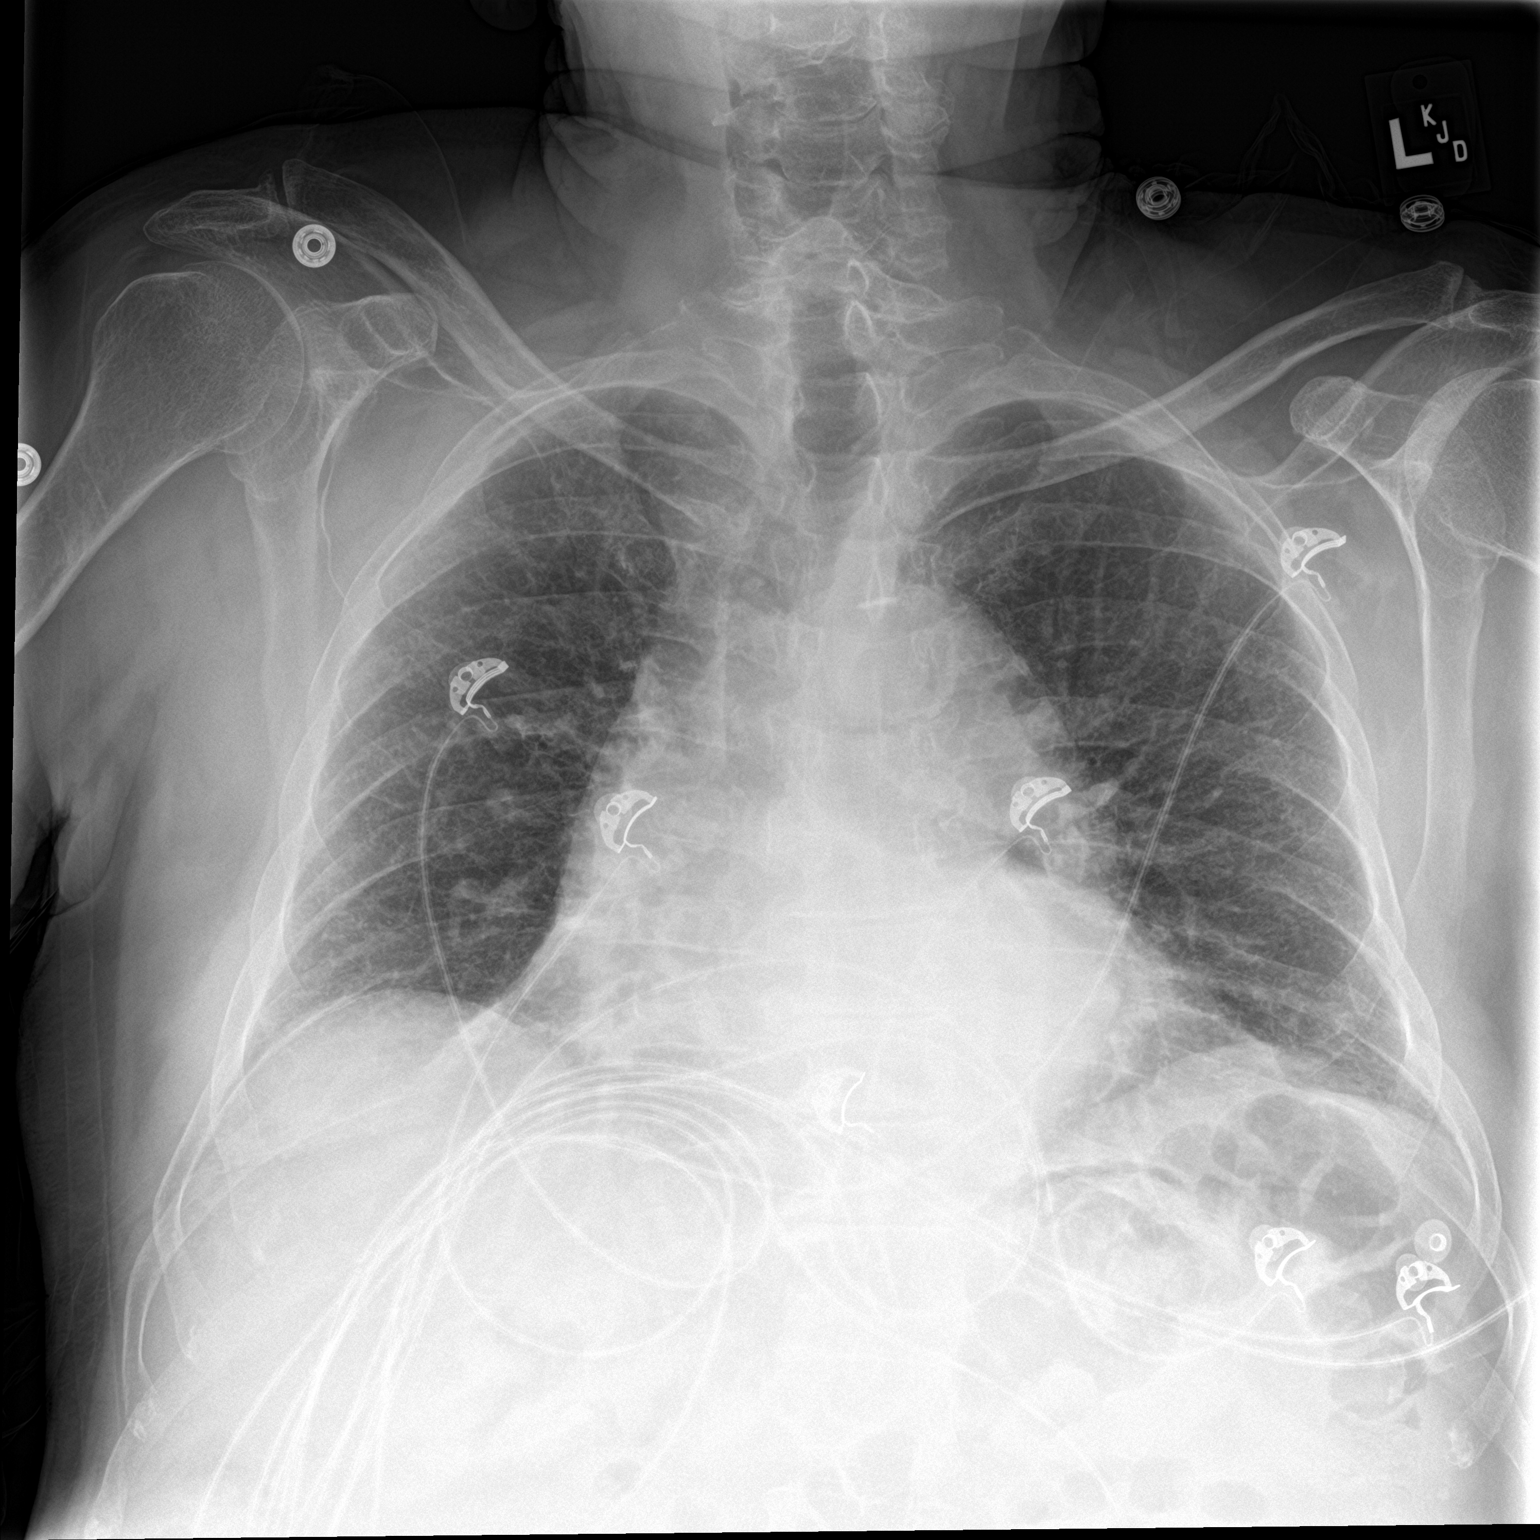

[chest lat]
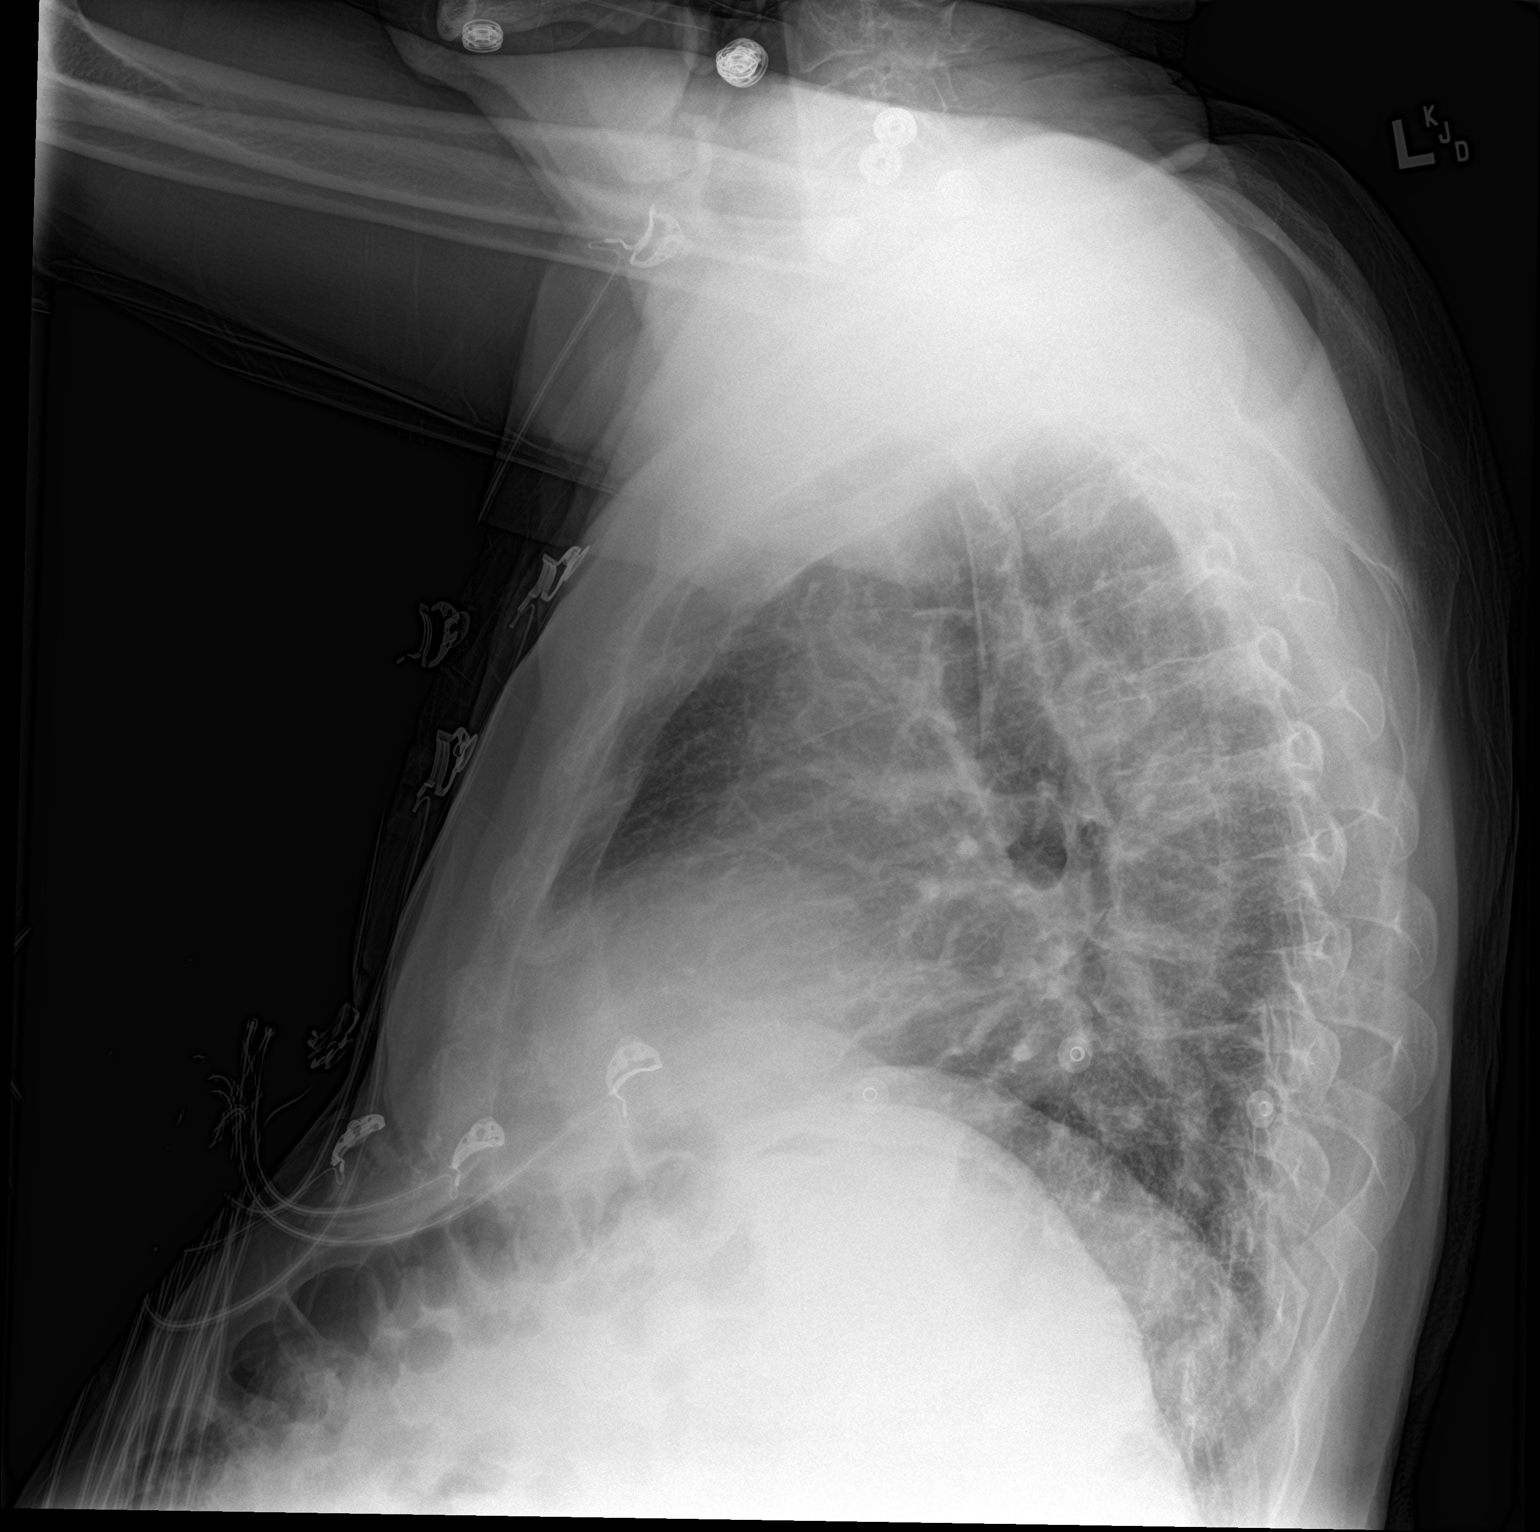

[2 of 2 positions shown; findings below may reference images not displayed]

FINDINGS: Lungs volumes are small, but are symmetric and are clear. No
pneumothorax or pleural effusion. Cardiac size within normal limits.
Pulmonary vascularity is normal. Osseous structures are
age-appropriate. No acute bone abnormality.
IMPRESSION: No active cardiopulmonary disease.

## 2022-11-26 DIAGNOSIS — M5416 Radiculopathy, lumbar region: Secondary | ICD-10-CM | POA: Diagnosis not present

## 2022-12-11 ENCOUNTER — Telehealth: Payer: Self-pay | Admitting: *Deleted

## 2022-12-11 ENCOUNTER — Telehealth: Payer: Self-pay

## 2022-12-11 NOTE — Telephone Encounter (Signed)
I contacted pt to schedule tele appt for preop clearance. Pt is scheduled 8/8 at 9:20am. Med rec and consent done.

## 2022-12-11 NOTE — Telephone Encounter (Signed)
I contacted pt to schedule tele appt for preop clearance. Pt is scheduled 8/8 at 9:20am. Med rec and consent done.    Patient Consent for Virtual Visit        Bruce [name] has provided verbal consent on 12/11/2022 for a virtual visit (video or telephone).   CONSENT FOR VIRTUAL VISIT FOR:  Bruce [name]  By participating in this virtual visit I agree to the following:  I hereby voluntarily request, consent and authorize [facility] and its employed or contracted physicians, physician assistants, nurse practitioners or other licensed health care professionals (the Practitioner), to provide me with telemedicine health care services (the "Services") as deemed necessary by the treating Practitioner. I acknowledge and consent to receive the Services by the Practitioner via telemedicine. I understand that the telemedicine visit will involve communicating with the Practitioner through live audiovisual communication technology and the disclosure of certain medical information by electronic transmission. I acknowledge that I have been given the opportunity to request an in-person assessment or other available alternative prior to the telemedicine visit and am voluntarily participating in the telemedicine visit.  I understand that I have the right to withhold or withdraw my consent to the use of telemedicine in the course of my care at any time, without affecting my right to future care or treatment, and that the Practitioner or I may terminate the telemedicine visit at any time. I understand that I have the right to inspect all information obtained and/or recorded in the course of the telemedicine visit and may receive copies of available information for a reasonable fee.  I understand that some of the potential risks of receiving the Services via telemedicine include:  Delay or interruption in medical evaluation due to technological equipment failure or disruption; Information transmitted may not  be sufficient (e.g. poor resolution of images) to allow for appropriate medical decision making by the Practitioner; and/or  In rare instances, security protocols could fail, causing a breach of personal health information.  Furthermore, I acknowledge that it is my responsibility to provide information about my medical history, conditions and care that is complete and accurate to the best of my ability. I acknowledge that Practitioner's advice, recommendations, and/or decision may be based on factors not within their control, such as incomplete or inaccurate data provided by me or distortions of diagnostic images or specimens that may result from electronic transmissions. I understand that the practice of medicine is not an exact science and that Practitioner makes no warranties or guarantees regarding treatment outcomes. I acknowledge that a copy of this consent can be made available to me via my patient portal [facility] MyChart), or I can request a printed copy by calling the office of [facility].    I understand that my insurance will be billed for this visit.   I have read or had this consent read to me. I understand the contents of this consent, which adequately explains the benefits and risks of the Services being provided via telemedicine.  I have been provided ample opportunity to ask questions regarding this consent and the Services and have had my questions answered to my satisfaction. I give my informed consent for the services to be provided through the use of telemedicine in my medical care

## 2022-12-11 NOTE — Telephone Encounter (Signed)
   Name: Bruce [name]  DOB: 1936-05-12  MRN: [id number]  Primary Cardiologist: Donato Schultz, MD   Preoperative team, please contact this patient and set up a phone call appointment for further preoperative risk assessment. Please obtain consent and complete medication review. Thank you for your help.  I confirm that guidance regarding antiplatelet and oral anticoagulation therapy has been completed and, if necessary, noted below.  Per office protocol, if patient is without any new symptoms or concerns at the time of their virtual visit, he/she may hold ASA for 5-7 days prior to procedure. Please resume ASA as soon as possible postprocedure, at the discretion of the surgeon.     Joni Reining, NP 12/11/2022, 11:40 AM [facility]

## 2022-12-11 NOTE — Telephone Encounter (Signed)
   Pre-operative Risk Assessment    Patient Name: Bruce [name]  DOB: 06/25/1935 MRN: [id number]      Request for Surgical Clearance    Procedure:   L3-4, L4-5 LAMINECTOMY, POSSIBLE L3-4 MICRODISKECTOMY  Date of Surgery:  Clearance TBD                                 Surgeon:  DR. Marikay Alar Surgeon's Group or Practice Name:  [facility] Phone number:  [phone] Fax number:  [phone] ATTN: [place] [address] EXT [id number]   Type of Clearance Requested:   - Medical ; ASA    Type of Anesthesia:  General    Additional requests/questions:    Elpidio Anis   12/11/2022, 10:58 AM

## 2022-12-22 NOTE — Progress Notes (Unsigned)
Virtual Visit via Telephone Note   Because of Bruce [name]'s co-morbid illnesses, he is at least at moderate risk for complications without adequate follow up.  This format is felt to be most appropriate for this patient at this time.  The patient did not have access to video technology/had technical difficulties with video requiring transitioning to audio format only (telephone).  All issues noted in this document were discussed and addressed.  No physical exam could be performed with this format.  Please refer to the patient's chart for his consent to telehealth for [facility].  Evaluation Performed:  Preoperative cardiovascular risk assessment _____________   Date:  12/24/2022   Patient ID:  Bruce [name], DOB [birth year], MRN [id number] Patient Location:  Home Provider location:   Office  Primary Care Provider:  Soundra Pilon, FNP Primary Cardiologist:  Donato Schultz, MD  Chief Complaint / Patient Profile   87 y.o. y/o male with a h/o bradycardia, Weinkebach block type I heart block, coronary artery disease status post MI in 2002 with stents to unknown arteries, recent nuclear medicine stress test in 2017 revealed no new areas of ischemia but evidence of consistent prior myocardial infarction in the inferior wall from the apex to the base, most recent echocardiogram on 05/15/2021 revealing normal EF of 55 to 60% with grade 1 diastolic dysfunction.Marland Kitchen    He is pending L3-4, L4-5, laminectomy, with possible L3-4 microdiscectomy by Dr. Marikay Alar, [facility], on date to be determined.  He presents today for telephonic preoperative cardiovascular risk assessment.   History of Present Illness    Bruce [name] is a 87 y.o. male who presents via audio/video conferencing for a telehealth visit today.  Pt was last seen in cardiology clinic on 03/02/2022 by Dr. Donato Schultz.  At that time Bruce [name] was doing well .  The patient is now pending procedure as  outlined above. Since his last visit, he reported a low heart rate alert on his watch.He was asymptomatic with this.  He denies chest pain, dyspnea on exertion, dizziness, or profound fatigue.  His main complaint is lower back pain and leg pain limiting his activity.  Past Medical History    Past Medical History:  Diagnosis Date   Arthritis    Coronary artery disease    ETT-Myoview EF 51%, inf infarct, no ischemia, low risk   History of echocardiogram    Echo 3/17: EF 55-60%, inferior HK, grade 1 diastolic dysfunctione   History of kidney stones    Hypertension    Macular degeneration    Myocardial infarction (HCC) 2000   with stent   PONV (postoperative nausea and vomiting)    Sleep apnea    cpap in use    Stage 3a chronic kidney disease (CKD) (HCC) 12/02/2021   Past Surgical History:  Procedure Laterality Date   CORONARY ANGIOPLASTY     EYE SURGERY     bilateral cataract surgery with lens implant   KNEE SURGERY Right    LEFT HEART CATHETERIZATION WITH CORONARY ANGIOGRAM N/A 07/01/2011   Procedure: LEFT HEART CATHETERIZATION WITH CORONARY ANGIOGRAM;  Surgeon: Donato Schultz, MD;  Location: [facility] CATH LAB;  Service: Cardiovascular;  Laterality: N/A;   left wrist surgery     left wrist fracture   PARTIAL KNEE ARTHROPLASTY Left 08/02/2017   Procedure: LEFT UNICOMPARTMENTAL KNEE;  Surgeon: Durene Romans, MD;  Location: [facility] ORS;  Service: Orthopedics;  Laterality: Left;   PARTIAL KNEE ARTHROPLASTY Right 03/02/2019  Procedure: UNICOMPARTMENTAL KNEE Medially;  Surgeon: Durene Romans, MD;  Location: [facility] ORS;  Service: Orthopedics;  Laterality: Right;  90 mins    Allergies  Allergies  Allergen Reactions   Clopidogrel Bisulfate Other (See Comments)   Nsaids     Other reaction(s): CKD Other reaction(s): CKD   Other    Clopidogrel Itching and Rash   Methyldopa Itching and Rash    Other reaction(s): rash   Plavix [Clopidogrel Bisulfate] Itching and Rash   Spironolactone Rash    Other  reaction(s): rash    Home Medications    Prior to Admission medications   Medication Sig Start Date End Date Taking? Authorizing Provider  amLODipine (NORVASC) 10 MG tablet Take 10 mg by mouth daily. 04/24/20   [provider]  amoxicillin (AMOXIL) 500 MG capsule amoxicillin 500 mg capsule  TAKE 4 CAPSULES BY MOUTH 1 HOUR PRIOR TO DENTAL PROCEDURE    [provider]  aspirin 81 MG chewable tablet aspirin 81 mg chewable tablet  Chew 1 tablet every day by oral route.    [provider]  atorvastatin (LIPITOR) 80 MG tablet Take 80 mg by mouth daily.    [provider]  beta carotene w/minerals (OCUVITE) tablet Take 1 tablet by mouth daily.    [provider]  doxazosin (CARDURA) 8 MG tablet Take 8 mg by mouth daily.    [provider]  lisinopril (ZESTRIL) 20 MG tablet Take 1 tablet (20 mg total) by mouth daily. 11/09/22   Jake Bathe, MD  Multiple Vitamin (MULTIVITAMIN WITH MINERALS) TABS tablet Take 1 tablet by mouth daily.    [provider]  omeprazole (PRILOSEC) 40 MG capsule Take 40 mg by mouth daily.    [provider]  vitamin B-12 (CYANOCOBALAMIN) 1000 MCG tablet Take 1,000 mcg by mouth daily.    [provider]    Physical Exam    Vital Signs:  Bruce [name] does not have vital signs available for review today.  Given telephonic nature of communication, physical exam is limited. AAOx3. NAD. Normal affect.  Speech and respirations are unlabored.  Accessory Clinical Findings    None  Assessment & Plan    1.  Preoperative Cardiovascular Risk Assessment: According to the Revised Cardiac Risk Index (RCRI), his Perioperative Risk of Major Cardiac Event is (%): 0.4  His Functional Capacity in METs is: 7.99 according to the [employer] Activity Status Index (DASI).    Per office protocol, if patient is without any new symptoms or concerns at the time of their virtual visit, he may hold ASA for 7 days  prior to procedure at the discretion of Dr. Yetta Barre . Please resume ASA  as soon as possible postprocedure, at the discretion of the surgeon.    The patient was advised that if he develops new symptoms prior to surgery to contact our office to arrange for a follow-up visit, and he verbalized understanding.  Therefore, based on ACC/AHA guidelines, patient would be at acceptable risk for the planned procedure without further cardiovascular testing. I will route this recommendation to the requesting party via Epic fax function.    Joni Reining, NP  12/24/2022, 9:23 AM

## 2022-12-24 ENCOUNTER — Ambulatory Visit: Payer: Medicare HMO | Attending: Internal Medicine

## 2022-12-24 DIAGNOSIS — Z01818 Encounter for other preprocedural examination: Secondary | ICD-10-CM

## 2022-12-31 NOTE — Telephone Encounter (Signed)
Patient called back to say that the dr office havent received a clearance from [place]. Send to [employer].Please advise

## 2022-12-31 NOTE — Telephone Encounter (Signed)
error 

## 2022-12-31 NOTE — Telephone Encounter (Signed)
I called the pt and his wife and assured them that our office did fax clearance notes on 12/24/22 from Joni Reining, DNP. I assured the pt that I will be more than happy to resend the notes today. Pt said thank you and appreciated my help.

## 2023-01-01 ENCOUNTER — Other Ambulatory Visit: Payer: Self-pay | Admitting: Neurological Surgery

## 2023-01-06 NOTE — Pre-Procedure Instructions (Signed)
Surgical Instructions   Your procedure is scheduled on Monday, August 26th. Report to [facility] Main Entrance "A" at 08:45 A.M., then check in with the Admitting office. Any questions or running late day of surgery: call [phone]  Questions prior to your surgery date: call [phone], Monday-Friday, 8am-4pm. If you experience any cold or flu symptoms such as cough, fever, chills, shortness of breath, etc. between now and your scheduled surgery, please notify us at the above number.     Remember:  Do not eat or drink after midnight the night before your surgery    Take these medicines the morning of surgery with A SIP OF WATER  doxazosin (CARDURA)  omeprazole (PRILOSEC)   May take these medicines IF NEEDED: acetaminophen (TYLENOL)   Follow your surgeon's instructions on when to stop Aspirin.  If no instructions were given by your surgeon then you will need to call the office to get those instructions.     One week prior to surgery, STOP taking any Aleve, Naproxen, Ibuprofen, Motrin, Advil, Goody's, BC's, all herbal medications, fish oil, and non-prescription vitamins.                     Do NOT Smoke (Tobacco/Vaping) for 24 hours prior to your procedure.  If you use a CPAP at night, you may bring your mask/headgear for your overnight stay.   You will be asked to remove any contacts, glasses, piercing's, hearing aid's, dentures/partials prior to surgery. Please bring cases for these items if needed.    Patients discharged the day of surgery will not be allowed to drive home, and someone needs to stay with them for 24 hours.  SURGICAL WAITING ROOM VISITATION Patients may have no more than 2 support people in the waiting area - these visitors may rotate.   Pre-op nurse will coordinate an appropriate time for 1 ADULT support person, who may not rotate, to accompany patient in pre-op.  Children under the age of 45 must have an adult with them who is not the patient and must  remain in the main waiting area with an adult.  If the patient needs to stay at the hospital during part of their recovery, the visitor guidelines for inpatient rooms apply.  Please refer to the [facility] website for the visitor guidelines for any additional information.   If you received a COVID test during your pre-op visit  it is requested that you wear a mask when out in public, stay away from anyone that may not be feeling well and notify your surgeon if you develop symptoms. If you have been in contact with anyone that has tested positive in the last 10 days please notify you surgeon.      Pre-operative 5 CHG Bathing Instructions   You can play a key role in reducing the risk of infection after surgery. Your skin needs to be as free of germs as possible. You can reduce the number of germs on your skin by washing with CHG (chlorhexidine gluconate) soap before surgery. CHG is an antiseptic soap that kills germs and continues to kill germs even after washing.   DO NOT use if you have an allergy to chlorhexidine/CHG or antibacterial soaps. If your skin becomes reddened or irritated, stop using the CHG and notify one of our RNs at [phone].   Please shower with the CHG soap starting 4 days before surgery using the following schedule:     Please keep in mind the following:  DO NOT shave, including legs and underarms, starting the day of your first shower.   You may shave your face at any point before/day of surgery.  Place clean sheets on your bed the day you start using CHG soap. Use a clean washcloth (not used since being washed) for each shower. DO NOT sleep with pets once you start using the CHG.   CHG Shower Instructions:  If you choose to wash your hair and private area, wash first with your normal shampoo/soap.  After you use shampoo/soap, rinse your hair and body thoroughly to remove shampoo/soap residue.  Turn the water OFF and apply about 3 tablespoons (45 ml) of CHG  soap to a CLEAN washcloth.  Apply CHG soap ONLY FROM YOUR NECK DOWN TO YOUR TOES (washing for 3-5 minutes)  DO NOT use CHG soap on face, private areas, open wounds, or sores.  Pay special attention to the area where your surgery is being performed.  If you are having back surgery, having someone wash your back for you may be helpful. Wait 2 minutes after CHG soap is applied, then you may rinse off the CHG soap.  Pat dry with a clean towel  Put on clean clothes/pajamas   If you choose to wear lotion, please use ONLY the CHG-compatible lotions on the back of this paper.   Additional instructions for the day of surgery: DO NOT APPLY any lotions, deodorants, cologne, or perfumes.   Do not bring valuables to the hospital. [facility] is not responsible for any belongings/valuables. Do not wear nail polish, gel polish, artificial nails, or any other type of covering on natural nails (fingers and toes) Do not wear jewelry or makeup Put on clean/comfortable clothes.  Please brush your teeth.  Ask your nurse before applying any prescription medications to the skin.     CHG Compatible Lotions   Aveeno Moisturizing lotion  Cetaphil Moisturizing Cream  Cetaphil Moisturizing Lotion  Clairol Herbal Essence Moisturizing Lotion, Dry Skin  Clairol Herbal Essence Moisturizing Lotion, Extra Dry Skin  Clairol Herbal Essence Moisturizing Lotion, Normal Skin  Curel Age Defying Therapeutic Moisturizing Lotion with Alpha Hydroxy  Curel Extreme Care Body Lotion  Curel Soothing Hands Moisturizing Hand Lotion  Curel Therapeutic Moisturizing Cream, Fragrance-Free  Curel Therapeutic Moisturizing Lotion, Fragrance-Free  Curel Therapeutic Moisturizing Lotion, Original Formula  Eucerin Daily Replenishing Lotion  Eucerin Dry Skin Therapy Plus Alpha Hydroxy Crme  Eucerin Dry Skin Therapy Plus Alpha Hydroxy Lotion  Eucerin Original Crme  Eucerin Original Lotion  Eucerin Plus Crme Eucerin Plus Lotion   Eucerin TriLipid Replenishing Lotion  Keri Anti-Bacterial Hand Lotion  Keri Deep Conditioning Original Lotion Dry Skin Formula Softly Scented  Keri Deep Conditioning Original Lotion, Fragrance Free Sensitive Skin Formula  Keri Lotion Fast Absorbing Fragrance Free Sensitive Skin Formula  Keri Lotion Fast Absorbing Softly Scented Dry Skin Formula  Keri Original Lotion  Keri Skin Renewal Lotion Keri Silky Smooth Lotion  Keri Silky Smooth Sensitive Skin Lotion  Nivea Body Creamy Conditioning Oil  Nivea Body Extra Enriched Lotion  Nivea Body Original Lotion  Nivea Body Sheer Moisturizing Lotion Nivea Crme  Nivea Skin Firming Lotion  NutraDerm 30 Skin Lotion  NutraDerm Skin Lotion  NutraDerm Therapeutic Skin Cream  NutraDerm Therapeutic Skin Lotion  ProShield Protective Hand Cream  Provon moisturizing lotion  Please read over the following fact sheets that you were given.

## 2023-01-07 ENCOUNTER — Encounter (HOSPITAL_COMMUNITY)
Admission: RE | Admit: 2023-01-07 | Discharge: 2023-01-07 | Disposition: A | Discharge status: Home / Self Care | Payer: Medicare HMO | Source: Ambulatory Visit | Attending: Neurological Surgery | Admitting: Neurological Surgery

## 2023-01-07 ENCOUNTER — Other Ambulatory Visit: Payer: Self-pay

## 2023-01-07 ENCOUNTER — Encounter (HOSPITAL_COMMUNITY): Payer: Self-pay

## 2023-01-07 VITALS — BP 141/72 | HR 78 | Temp 97.7°F | Resp 18 | Ht 65.5 in | Wt 183.6 lb

## 2023-01-07 DIAGNOSIS — I2510 Atherosclerotic heart disease of native coronary artery without angina pectoris: Secondary | ICD-10-CM | POA: Diagnosis not present

## 2023-01-07 DIAGNOSIS — I10 Essential (primary) hypertension: Secondary | ICD-10-CM | POA: Diagnosis not present

## 2023-01-07 DIAGNOSIS — I440 Atrioventricular block, first degree: Secondary | ICD-10-CM | POA: Diagnosis not present

## 2023-01-07 DIAGNOSIS — Z01818 Encounter for other preprocedural examination: Secondary | ICD-10-CM | POA: Diagnosis not present

## 2023-01-07 DIAGNOSIS — I252 Old myocardial infarction: Secondary | ICD-10-CM | POA: Insufficient documentation

## 2023-01-07 HISTORY — DX: Atrioventricular block, second degree: I44.1

## 2023-01-07 HISTORY — DX: Bradycardia, unspecified: R00.1

## 2023-01-07 HISTORY — DX: Gastro-esophageal reflux disease without esophagitis: K21.9

## 2023-01-07 LAB — BASIC METABOLIC PANEL
Anion gap: 9 (ref 5–15)
BUN: 11 mg/dL (ref 8–23)
CO2: 24 mmol/L (ref 22–32)
Calcium: 8.9 mg/dL (ref 8.9–10.3)
Chloride: 105 mmol/L (ref 98–111)
Creatinine, Ser: 1.48 mg/dL — ABNORMAL HIGH (ref 0.61–1.24)
GFR, Estimated: 46 mL/min — ABNORMAL LOW (ref >60)
Glucose, Bld: 98 mg/dL (ref 70–99)
Potassium: 4.3 mmol/L (ref 3.5–5.1)
Sodium: 138 mmol/L (ref 135–145)

## 2023-01-07 LAB — CBC
HCT: 41.9 % (ref 39.0–52.0)
Hemoglobin: 13.8 g/dL (ref 13.0–17.0)
MCH: 31.5 pg (ref 26.0–34.0)
MCHC: 32.9 g/dL (ref 30.0–36.0)
MCV: 95.7 fL (ref 80.0–100.0)
Platelets: 164 10*3/uL (ref 150–400)
RBC: 4.38 MIL/uL (ref 4.22–5.81)
RDW: 13.7 % (ref 11.5–15.5)
WBC: 5 10*3/uL (ref 4.0–10.5)
nRBC: 0 % (ref 0.0–0.2)

## 2023-01-07 LAB — SURGICAL PCR SCREEN
MRSA, PCR: NEGATIVE
Staphylococcus aureus: NEGATIVE

## 2023-01-07 LAB — PROTIME-INR
INR: 0.9 (ref 0.8–1.2)
Prothrombin Time: 12.7 seconds (ref 11.4–15.2)

## 2023-01-07 NOTE — Progress Notes (Signed)
PCP - Peri Maris, FNP Cardiologist - Dr. Donato Schultz  PPM/ICD - denies   Chest x-ray - 11/10/20 EKG - 01/07/23 Stress Test - 11/13/17 ECHO - 05/15/21 Cardiac Cath - 07/01/11  Sleep Study - OSA+ CPAP - nightly, pt unsure what pressure settings are  DM- denies  Blood Thinner Instructions: n/a Aspirin Instructions: Hold 7 days. Last dose 8/18  ERAS Protcol - no, NPO   COVID TEST- n/a   Anesthesia review: yes, cardiac hx  Patient denies shortness of breath, fever, cough and chest pain at PAT appointment   All instructions explained to the patient, with a verbal understanding of the material. Patient agrees to go over the instructions while at home for a better understanding.  The opportunity to ask questions was provided.

## 2023-01-08 NOTE — Anesthesia Preprocedure Evaluation (Signed)
Anesthesia Evaluation  Patient identified by MRN, date of birth, ID band Patient awake    Reviewed: Allergy & Precautions, NPO status , Patient's Chart, lab work & pertinent test results  History of Anesthesia Complications (+) PONV and history of anesthetic complications  Airway Mallampati: II  TM Distance: >3 FB Neck ROM: Full    Dental  (+) Dental Advisory Given, Chipped, Poor Dentition, Missing, Partial Upper, Partial Lower   Pulmonary sleep apnea    Pulmonary exam normal breath sounds clear to auscultation       Cardiovascular hypertension, Pt. on medications + CAD, + Past MI and + Cardiac Stents  Normal cardiovascular exam+ dysrhythmias  Rhythm:Regular Rate:Normal  Echo 2022  1. Left ventricular ejection fraction, by estimation, is 55 to 60%. Left ventricular ejection fraction by 3D volume is 56 %. The left ventricle has normal function. The left ventricle has no regional wall motion abnormalities. Left ventricular diastolic  parameters are consistent with Grade I diastolic dysfunction (impaired relaxation).   2. Right ventricular systolic function is normal. The right ventricular size is normal. There is normal pulmonary artery systolic pressure. The estimated right ventricular systolic pressure is 24.0 mmHg.   3. The mitral valve is grossly normal. Trivial mitral valve regurgitation. No evidence of mitral stenosis.   4. The aortic valve is tricuspid. Aortic valve regurgitation is not visualized. No aortic stenosis is present.   5. The inferior vena cava is normal in size with greater than 50% respiratory variability, suggesting right atrial pressure of 3 mmHg.   Comparison(s): No significant change from prior study.    Stress MPS 2019 Normal LVSF, no evidence of ischemia.   Neuro/Psych negative neurological ROS  negative psych ROS   GI/Hepatic Neg liver ROS, PUD,GERD  Medicated,,  Endo/Other  negative endocrine ROS     Renal/GU Renal disease     Musculoskeletal  (+) Arthritis , Osteoarthritis,    Abdominal   Peds negative pediatric ROS (+)  Hematology negative hematology ROS (+)   Anesthesia Other Findings   Reproductive/Obstetrics                             Anesthesia Physical Anesthesia Plan  ASA: 3  Anesthesia Plan: General   Post-op Pain Management:  Regional for Post-op pain and Ofirmev IV (intra-op)* and Gabapentin PO (pre-op)*   Induction: Intravenous  PONV Risk Score and Plan: 4 or greater and Ondansetron, Treatment may vary due to age or medical condition and Dexamethasone  Airway Management Planned: Oral ETT  Additional Equipment: None  Intra-op Plan:   Post-operative Plan: Extubation in OR  Informed Consent: I have reviewed the patients History and Physical, chart, labs and discussed the procedure including the risks, benefits and alternatives for the proposed anesthesia with the patient or authorized representative who has indicated his/her understanding and acceptance.     Dental advisory given  Plan Discussed with: CRNA  Anesthesia Plan Comments: (PAT note by Antionette Poles, PA-C: Follows with cardiology for hx of bradycardia, Weinkebach block type I heart block, coronary artery disease status post MI in 2002 with stents to unknown arteries, recent nuclear medicine stress test in 2017 revealed no new areas of ischemia but evidence of consistent prior myocardial infarction in the inferior wall from the apex to the base, most recent echocardiogram on 05/15/2021 revealing normal EF of 55 to 60% with grade 1 diastolic dysfunction. Seen by Joni Reining 12/24/22 for preop eval. Per note, "Preoperative  Cardiovascular Risk Assessment: According to the Revised Cardiac Risk Index (RCRI), his Perioperative Risk of Major Cardiac Event is (%): 0.4. His Functional Capacity in METs is: 7.99 according to the [facility] Activity Status Index (DASI). Per office  protocol, if patient is without any new symptoms or concerns at the time of their virtual visit, he may hold ASA for 7 days prior to procedure at the discretion of Dr. Yetta Barre . Please resume ASA  as soon as possible postprocedure, at the discretion of the surgeon."  OSA on CPAP.  History of CKD 3.  GERD maintained on omeprazole.  Preop labs reviewed, creatinine mildly elevated 1.48 consistent with history, otherwise WNL.  EKG 01/07/2023: Sinus rhythm with 1st degree A-V block. Rate 71. Inferior infarct , age undetermined  Event monitor 01/05/22:   Sinus rhythm with first degree AV block.   Rare short bursts of atrial tachycardia - benign   Rare PAC/PVC's   Brief second degree HB type 1 (Wenckebach) was present   Bradycardia (40's) noted during sleep - asymptomatic. No indication for pacemaker at this time.  TTE 05/15/21: 1. Left ventricular ejection fraction, by estimation, is 55 to 60%. Left  ventricular ejection fraction by 3D volume is 56 %. The left ventricle has  normal function. The left ventricle has no regional wall motion  abnormalities. Left ventricular diastolic  parameters are consistent with Grade I diastolic dysfunction (impaired  relaxation).  2. Right ventricular systolic function is normal. The right ventricular  size is normal. There is normal pulmonary artery systolic pressure. The  estimated right ventricular systolic pressure is 24.0 mmHg.  3. The mitral valve is grossly normal. Trivial mitral valve  regurgitation. No evidence of mitral stenosis.  4. The aortic valve is tricuspid. Aortic valve regurgitation is not  visualized. No aortic stenosis is present.  5. The inferior vena cava is normal in size with greater than 50%  respiratory variability, suggesting right atrial pressure of 3 mmHg.   Comparison(s): No significant change from prior study.   Nuclear stress 08/12/15:  Nuclear stress EF: 51%.  There was no ST segment deviation noted during  stress.  Findings consistent with prior myocardial infarction.  This is a low risk study.  The left ventricular ejection fraction is mildly decreased (45-54%).   Small inferior wall infarct from apex to base with no ischemia EF 51%    )        Anesthesia Quick Evaluation

## 2023-01-08 NOTE — Progress Notes (Signed)
Anesthesia Chart Review:  Follows with cardiology for hx of bradycardia, Weinkebach block type I heart block, coronary artery disease status post MI in 2002 with stents to unknown arteries, recent nuclear medicine stress test in 2017 revealed no new areas of ischemia but evidence of consistent prior myocardial infarction in the inferior wall from the apex to the base, most recent echocardiogram on 05/15/2021 revealing normal EF of 55 to 60% with grade 1 diastolic dysfunction. Seen by Joni Reining 12/24/22 for preop eval. Per note, "Preoperative Cardiovascular Risk Assessment: According to the Revised Cardiac Risk Index (RCRI), his Perioperative Risk of Major Cardiac Event is (%): 0.4. His Functional Capacity in METs is: 7.99 according to the Duke Activity Status Index (DASI). Per office protocol, if patient is without any new symptoms or concerns at the time of their virtual visit, he may hold ASA for 7 days prior to procedure at the discretion of Dr. Yetta Barre . Please resume ASA  as soon as possible postprocedure, at the discretion of the surgeon."   OSA on CPAP.  History of CKD 3.  GERD maintained on omeprazole.  Preop labs reviewed, creatinine mildly elevated 1.48 consistent with history, otherwise WNL.  EKG 01/07/2023: Sinus rhythm with 1st degree A-V block. Rate 71. Inferior infarct , age undetermined  Event monitor 01/05/22:   Sinus rhythm with first degree AV block.   Rare short bursts of atrial tachycardia - benign   Rare PAC/PVC's   Brief second degree HB type 1 (Wenckebach) was present   Bradycardia (40's) noted during sleep - asymptomatic. No indication for pacemaker at this time.  TTE 05/15/21:  1. Left ventricular ejection fraction, by estimation, is 55 to 60%. Left  ventricular ejection fraction by 3D volume is 56 %. The left ventricle has  normal function. The left ventricle has no regional wall motion  abnormalities. Left ventricular diastolic   parameters are consistent with  Grade I diastolic dysfunction (impaired  relaxation).   2. Right ventricular systolic function is normal. The right ventricular  size is normal. There is normal pulmonary artery systolic pressure. The  estimated right ventricular systolic pressure is 24.0 mmHg.   3. The mitral valve is grossly normal. Trivial mitral valve  regurgitation. No evidence of mitral stenosis.   4. The aortic valve is tricuspid. Aortic valve regurgitation is not  visualized. No aortic stenosis is present.   5. The inferior vena cava is normal in size with greater than 50%  respiratory variability, suggesting right atrial pressure of 3 mmHg.   Comparison(s): No significant change from prior study.   Nuclear stress 08/12/15: Nuclear stress EF: 51%. There was no ST segment deviation noted during stress. Findings consistent with prior myocardial infarction. This is a low risk study. The left ventricular ejection fraction is mildly decreased (45-54%).   Small inferior wall infarct from apex to base with no ischemia EF 51%      Zannie Cove [facility]/Anesthesiology Phone [phone] 01/08/2023 11:20 AM

## 2023-01-11 ENCOUNTER — Ambulatory Visit (HOSPITAL_BASED_OUTPATIENT_CLINIC_OR_DEPARTMENT_OTHER): Payer: Medicare HMO

## 2023-01-11 ENCOUNTER — Encounter (HOSPITAL_COMMUNITY): Payer: Self-pay | Admitting: Neurological Surgery

## 2023-01-11 ENCOUNTER — Observation Stay (HOSPITAL_COMMUNITY)
Admission: RE | Admit: 2023-01-11 | Discharge: 2023-01-12 | Disposition: A | Discharge status: Home Health | Payer: Medicare HMO | Attending: Neurological Surgery | Admitting: Neurological Surgery

## 2023-01-11 ENCOUNTER — Ambulatory Visit (HOSPITAL_COMMUNITY): Payer: Medicare HMO | Admitting: Physician Assistant

## 2023-01-11 ENCOUNTER — Other Ambulatory Visit: Payer: Self-pay

## 2023-01-11 ENCOUNTER — Ambulatory Visit (HOSPITAL_COMMUNITY): Payer: Medicare HMO

## 2023-01-11 ENCOUNTER — Ambulatory Visit (HOSPITAL_COMMUNITY)
Admission: RE | Disposition: A | Discharge status: Home Health | Payer: Self-pay | Source: Home / Self Care | Attending: Neurological Surgery

## 2023-01-11 DIAGNOSIS — M5126 Other intervertebral disc displacement, lumbar region: Secondary | ICD-10-CM | POA: Diagnosis not present

## 2023-01-11 DIAGNOSIS — N1831 Chronic kidney disease, stage 3a: Secondary | ICD-10-CM | POA: Insufficient documentation

## 2023-01-11 DIAGNOSIS — Z7982 Long term (current) use of aspirin: Secondary | ICD-10-CM | POA: Insufficient documentation

## 2023-01-11 DIAGNOSIS — I129 Hypertensive chronic kidney disease with stage 1 through stage 4 chronic kidney disease, or unspecified chronic kidney disease: Secondary | ICD-10-CM

## 2023-01-11 DIAGNOSIS — Z96653 Presence of artificial knee joint, bilateral: Secondary | ICD-10-CM | POA: Diagnosis not present

## 2023-01-11 DIAGNOSIS — M5116 Intervertebral disc disorders with radiculopathy, lumbar region: Secondary | ICD-10-CM | POA: Diagnosis not present

## 2023-01-11 DIAGNOSIS — M48061 Spinal stenosis, lumbar region without neurogenic claudication: Secondary | ICD-10-CM | POA: Insufficient documentation

## 2023-01-11 DIAGNOSIS — I2510 Atherosclerotic heart disease of native coronary artery without angina pectoris: Secondary | ICD-10-CM | POA: Insufficient documentation

## 2023-01-11 DIAGNOSIS — Z79899 Other long term (current) drug therapy: Secondary | ICD-10-CM | POA: Insufficient documentation

## 2023-01-11 DIAGNOSIS — M47816 Spondylosis without myelopathy or radiculopathy, lumbar region: Secondary | ICD-10-CM | POA: Diagnosis not present

## 2023-01-11 DIAGNOSIS — Z9889 Other specified postprocedural states: Principal | ICD-10-CM

## 2023-01-11 HISTORY — PX: LUMBAR LAMINECTOMY/DECOMPRESSION MICRODISCECTOMY: SHX5026

## 2023-01-11 SURGERY — LUMBAR LAMINECTOMY/DECOMPRESSION MICRODISCECTOMY 2 LEVELS
Anesthesia: General | Site: Back | Laterality: Left

## 2023-01-11 MED ORDER — THROMBIN 20000 UNITS EX SOLR
CUTANEOUS | Status: AC
  Filled 2023-01-11: qty 20000

## 2023-01-11 MED ORDER — HEMOSTATIC AGENTS (NO CHARGE) OPTIME
TOPICAL | Status: DC | PRN
  Administered 2023-01-11: 1 via TOPICAL

## 2023-01-11 MED ORDER — VASOPRESSIN 20 UNIT/ML IV SOLN
INTRAVENOUS | Status: AC
  Filled 2023-01-11: qty 1

## 2023-01-11 MED ORDER — FENTANYL CITRATE (PF) 250 MCG/5ML IJ SOLN
INTRAMUSCULAR | Status: DC | PRN
  Administered 2023-01-11: 100 ug via INTRAVENOUS
  Administered 2023-01-11: 50 ug via INTRAVENOUS

## 2023-01-11 MED ORDER — CEFAZOLIN SODIUM-DEXTROSE 2-4 GM/100ML-% IV SOLN
2.0000 g | INTRAVENOUS | Status: AC
  Administered 2023-01-11: 2 g via INTRAVENOUS
  Filled 2023-01-11: qty 100

## 2023-01-11 MED ORDER — 0.9 % SODIUM CHLORIDE (POUR BTL) OPTIME
TOPICAL | Status: DC | PRN
  Administered 2023-01-11: 1000 mL

## 2023-01-11 MED ORDER — LACTATED RINGERS IV SOLN: INTRAVENOUS | Status: DC

## 2023-01-11 MED ORDER — POTASSIUM CHLORIDE IN NACL 20-0.9 MEQ/L-% IV SOLN: INTRAVENOUS | Status: DC

## 2023-01-11 MED ORDER — THROMBIN 20000 UNITS EX KIT
PACK | CUTANEOUS | Status: DC | PRN
  Administered 2023-01-11: 20000 [IU] via TOPICAL

## 2023-01-11 MED ORDER — SODIUM CHLORIDE 0.9% FLUSH
3.0000 mL | Freq: Two times a day (BID) | INTRAVENOUS | Status: DC
  Administered 2023-01-11 (×2): 3 mL via INTRAVENOUS

## 2023-01-11 MED ORDER — ONDANSETRON HCL 4 MG/2ML IJ SOLN: 4.0000 mg | Freq: Four times a day (QID) | INTRAMUSCULAR | Status: DC | PRN

## 2023-01-11 MED ORDER — ROCURONIUM BROMIDE 10 MG/ML (PF) SYRINGE
PREFILLED_SYRINGE | INTRAVENOUS | Status: DC | PRN
  Administered 2023-01-11: 60 mg via INTRAVENOUS

## 2023-01-11 MED ORDER — METHOCARBAMOL 500 MG PO TABS
ORAL_TABLET | ORAL | Status: AC
  Administered 2023-01-11: 500 mg via ORAL
  Filled 2023-01-11: qty 1

## 2023-01-11 MED ORDER — ORAL CARE MOUTH RINSE: 15.0000 mL | Freq: Once | OROMUCOSAL | Status: AC

## 2023-01-11 MED ORDER — PHENOL 1.4 % MT LIQD: 1.0000 | OROMUCOSAL | Status: DC | PRN

## 2023-01-11 MED ORDER — ONDANSETRON HCL 4 MG/2ML IJ SOLN
INTRAMUSCULAR | Status: DC | PRN
  Administered 2023-01-11: 4 mg via INTRAVENOUS

## 2023-01-11 MED ORDER — DOXAZOSIN MESYLATE 8 MG PO TABS
8.0000 mg | ORAL_TABLET | Freq: Every morning | ORAL | Status: DC
  Administered 2023-01-12: 8 mg via ORAL
  Filled 2023-01-11: qty 1

## 2023-01-11 MED ORDER — CHLORHEXIDINE GLUCONATE 0.12 % MT SOLN
15.0000 mL | Freq: Once | OROMUCOSAL | Status: AC
  Administered 2023-01-11: 15 mL via OROMUCOSAL
  Filled 2023-01-11: qty 15

## 2023-01-11 MED ORDER — SUGAMMADEX SODIUM 200 MG/2ML IV SOLN
INTRAVENOUS | Status: DC | PRN
  Administered 2023-01-11: 200 mg via INTRAVENOUS

## 2023-01-11 MED ORDER — METHOCARBAMOL 500 MG PO TABS
500.0000 mg | ORAL_TABLET | Freq: Four times a day (QID) | ORAL | Status: DC | PRN
  Administered 2023-01-11 – 2023-01-12 (×3): 500 mg via ORAL
  Filled 2023-01-11 (×3): qty 1

## 2023-01-11 MED ORDER — SODIUM CHLORIDE 0.9 % IV SOLN
250.0000 mL | INTRAVENOUS | Status: DC
  Administered 2023-01-11: 250 mL via INTRAVENOUS

## 2023-01-11 MED ORDER — GABAPENTIN 300 MG PO CAPS
300.0000 mg | ORAL_CAPSULE | ORAL | Status: AC
  Administered 2023-01-11: 300 mg via ORAL
  Filled 2023-01-11: qty 1

## 2023-01-11 MED ORDER — ACETAMINOPHEN 500 MG PO TABS
1000.0000 mg | ORAL_TABLET | ORAL | Status: AC
  Administered 2023-01-11: 1000 mg via ORAL
  Filled 2023-01-11: qty 2

## 2023-01-11 MED ORDER — AMLODIPINE BESYLATE 10 MG PO TABS
10.0000 mg | ORAL_TABLET | Freq: Every day | ORAL | Status: DC
  Administered 2023-01-11: 10 mg via ORAL
  Filled 2023-01-11: qty 1

## 2023-01-11 MED ORDER — DEXAMETHASONE 4 MG PO TABS
4.0000 mg | ORAL_TABLET | Freq: Four times a day (QID) | ORAL | Status: DC
  Administered 2023-01-11 – 2023-01-12 (×4): 4 mg via ORAL
  Filled 2023-01-11 (×4): qty 1

## 2023-01-11 MED ORDER — ONDANSETRON HCL 4 MG/2ML IJ SOLN: 4.0000 mg | Freq: Once | INTRAMUSCULAR | Status: DC | PRN

## 2023-01-11 MED ORDER — PHENYLEPHRINE 80 MCG/ML (10ML) SYRINGE FOR IV PUSH (FOR BLOOD PRESSURE SUPPORT)
PREFILLED_SYRINGE | INTRAVENOUS | Status: DC | PRN
  Administered 2023-01-11: 160 ug via INTRAVENOUS
  Administered 2023-01-11: 80 ug via INTRAVENOUS

## 2023-01-11 MED ORDER — MORPHINE SULFATE (PF) 2 MG/ML IV SOLN
2.0000 mg | INTRAVENOUS | Status: DC | PRN
  Administered 2023-01-11: 2 mg via INTRAVENOUS
  Filled 2023-01-11: qty 1

## 2023-01-11 MED ORDER — ACETAMINOPHEN 325 MG PO TABS: 650.0000 mg | ORAL_TABLET | ORAL | Status: DC | PRN

## 2023-01-11 MED ORDER — SENNA 8.6 MG PO TABS
1.0000 | ORAL_TABLET | Freq: Two times a day (BID) | ORAL | Status: DC
  Administered 2023-01-11 – 2023-01-12 (×2): 8.6 mg via ORAL
  Filled 2023-01-11 (×2): qty 1

## 2023-01-11 MED ORDER — OXYCODONE HCL 5 MG PO TABS
ORAL_TABLET | ORAL | Status: AC
  Filled 2023-01-11: qty 1

## 2023-01-11 MED ORDER — LIDOCAINE 2% (20 MG/ML) 5 ML SYRINGE
INTRAMUSCULAR | Status: DC | PRN
  Administered 2023-01-11: 100 mg via INTRAVENOUS

## 2023-01-11 MED ORDER — BUPIVACAINE HCL (PF) 0.25 % IJ SOLN
INTRAMUSCULAR | Status: AC
  Filled 2023-01-11: qty 30

## 2023-01-11 MED ORDER — HYDROCODONE-ACETAMINOPHEN 7.5-325 MG PO TABS
1.0000 | ORAL_TABLET | ORAL | Status: DC | PRN
  Administered 2023-01-11 – 2023-01-12 (×3): 1 via ORAL
  Filled 2023-01-11 (×3): qty 1

## 2023-01-11 MED ORDER — BUPIVACAINE HCL (PF) 0.25 % IJ SOLN
INTRAMUSCULAR | Status: DC | PRN
  Administered 2023-01-11: 7 mL

## 2023-01-11 MED ORDER — DEXAMETHASONE SODIUM PHOSPHATE 4 MG/ML IJ SOLN
4.0000 mg | Freq: Four times a day (QID) | INTRAMUSCULAR | Status: DC
  Administered 2023-01-11: 4 mg via INTRAVENOUS
  Filled 2023-01-11: qty 1

## 2023-01-11 MED ORDER — PANTOPRAZOLE SODIUM 40 MG PO TBEC
40.0000 mg | DELAYED_RELEASE_TABLET | Freq: Every day | ORAL | Status: DC
  Administered 2023-01-12: 40 mg via ORAL
  Filled 2023-01-11: qty 1

## 2023-01-11 MED ORDER — FENTANYL CITRATE (PF) 100 MCG/2ML IJ SOLN
25.0000 ug | INTRAMUSCULAR | Status: DC | PRN
  Administered 2023-01-11 (×3): 25 ug via INTRAVENOUS

## 2023-01-11 MED ORDER — PHENYLEPHRINE HCL-NACL 20-0.9 MG/250ML-% IV SOLN
INTRAVENOUS | Status: DC | PRN
  Administered 2023-01-11: 30 ug/min via INTRAVENOUS

## 2023-01-11 MED ORDER — CEFAZOLIN SODIUM-DEXTROSE 2-4 GM/100ML-% IV SOLN
2.0000 g | Freq: Three times a day (TID) | INTRAVENOUS | Status: AC
  Administered 2023-01-11 (×2): 2 g via INTRAVENOUS
  Filled 2023-01-11 (×2): qty 100

## 2023-01-11 MED ORDER — HYDROCODONE-ACETAMINOPHEN 7.5-325 MG PO TABS
1.0000 | ORAL_TABLET | Freq: Four times a day (QID) | ORAL | Status: DC
  Administered 2023-01-11 (×2): 1 via ORAL
  Filled 2023-01-11 (×2): qty 1

## 2023-01-11 MED ORDER — THROMBIN 5000 UNITS EX SOLR
OROMUCOSAL | Status: DC | PRN
  Administered 2023-01-11: 5 mL via TOPICAL

## 2023-01-11 MED ORDER — FENTANYL CITRATE (PF) 250 MCG/5ML IJ SOLN
INTRAMUSCULAR | Status: AC
  Filled 2023-01-11: qty 5

## 2023-01-11 MED ORDER — CHLORHEXIDINE GLUCONATE CLOTH 2 % EX PADS: 6.0000 | MEDICATED_PAD | Freq: Once | CUTANEOUS | Status: DC

## 2023-01-11 MED ORDER — VASOPRESSIN 20 UNIT/ML IV SOLN
INTRAVENOUS | Status: DC | PRN
  Administered 2023-01-11: 1 [IU] via INTRAVENOUS

## 2023-01-11 MED ORDER — LISINOPRIL 20 MG PO TABS: 20.0000 mg | ORAL_TABLET | Freq: Every day | ORAL | Status: DC

## 2023-01-11 MED ORDER — SODIUM CHLORIDE 0.9% FLUSH: 3.0000 mL | INTRAVENOUS | Status: DC | PRN

## 2023-01-11 MED ORDER — DEXAMETHASONE SODIUM PHOSPHATE 10 MG/ML IJ SOLN
INTRAMUSCULAR | Status: DC | PRN
  Administered 2023-01-11: 10 mg via INTRAVENOUS

## 2023-01-11 MED ORDER — ONDANSETRON HCL 4 MG PO TABS: 4.0000 mg | ORAL_TABLET | Freq: Four times a day (QID) | ORAL | Status: DC | PRN

## 2023-01-11 MED ORDER — FENTANYL CITRATE (PF) 100 MCG/2ML IJ SOLN
INTRAMUSCULAR | Status: AC
  Administered 2023-01-11: 25 ug via INTRAVENOUS
  Filled 2023-01-11: qty 2

## 2023-01-11 MED ORDER — MENTHOL 3 MG MT LOZG: 1.0000 | LOZENGE | OROMUCOSAL | Status: DC | PRN

## 2023-01-11 MED ORDER — OXYCODONE HCL 5 MG PO TABS
5.0000 mg | ORAL_TABLET | Freq: Once | ORAL | Status: AC
  Administered 2023-01-11: 5 mg via ORAL

## 2023-01-11 MED ORDER — METHOCARBAMOL 1000 MG/10ML IJ SOLN: 500.0000 mg | Freq: Four times a day (QID) | INTRAVENOUS | Status: DC | PRN

## 2023-01-11 MED ORDER — ACETAMINOPHEN 650 MG RE SUPP: 650.0000 mg | RECTAL | Status: DC | PRN

## 2023-01-11 MED ORDER — EPHEDRINE SULFATE-NACL 50-0.9 MG/10ML-% IV SOSY
PREFILLED_SYRINGE | INTRAVENOUS | Status: DC | PRN
  Administered 2023-01-11: 10 mg via INTRAVENOUS
  Administered 2023-01-11: 7.5 mg via INTRAVENOUS

## 2023-01-11 MED ORDER — THROMBIN 5000 UNITS EX SOLR
CUTANEOUS | Status: AC
  Filled 2023-01-11: qty 5000

## 2023-01-11 MED ORDER — PROPOFOL 10 MG/ML IV BOLUS
INTRAVENOUS | Status: DC | PRN
  Administered 2023-01-11: 150 mg via INTRAVENOUS

## 2023-01-11 SURGICAL SUPPLY — 44 items
ADH SKN CLS APL DERMABOND .7 (GAUZE/BANDAGES/DRESSINGS) ×1
APL SKNCLS STERI-STRIP NONHPOA (GAUZE/BANDAGES/DRESSINGS) ×1
BAG COUNTER SPONGE SURGICOUNT (BAG) ×2 IMPLANT
BAG SPNG CNTER NS LX DISP (BAG) ×1
BENZOIN TINCTURE PRP APPL 2/3 (GAUZE/BANDAGES/DRESSINGS) ×2 IMPLANT
BUR CARBIDE MATCH 3.0 (BURR) ×2 IMPLANT
CANISTER SUCT 3000ML PPV (MISCELLANEOUS) ×2 IMPLANT
DERMABOND ADVANCED .7 DNX12 (GAUZE/BANDAGES/DRESSINGS) IMPLANT
DRAPE LAPAROTOMY 100X72X124 (DRAPES) ×2 IMPLANT
DRAPE MICROSCOPE SLANT 54X150 (MISCELLANEOUS) ×2 IMPLANT
DRAPE SURG 17X23 STRL (DRAPES) ×2 IMPLANT
DRSG OPSITE 4X5.5 SM (GAUZE/BANDAGES/DRESSINGS) IMPLANT
DRSG OPSITE POSTOP 4X6 (GAUZE/BANDAGES/DRESSINGS) IMPLANT
DURAPREP 26ML APPLICATOR (WOUND CARE) ×2 IMPLANT
ELECT REM PT RETURN 9FT ADLT (ELECTROSURGICAL) ×1
ELECTRODE REM PT RTRN 9FT ADLT (ELECTROSURGICAL) ×2 IMPLANT
GAUZE 4X4 16PLY ~~LOC~~+RFID DBL (SPONGE) IMPLANT
GLOVE BIO SURGEON STRL SZ7 (GLOVE) IMPLANT
GLOVE BIO SURGEON STRL SZ8 (GLOVE) ×2 IMPLANT
GLOVE BIOGEL PI IND STRL 7.0 (GLOVE) IMPLANT
GOWN STRL REUS W/ TWL LRG LVL3 (GOWN DISPOSABLE) IMPLANT
GOWN STRL REUS W/ TWL XL LVL3 (GOWN DISPOSABLE) ×2 IMPLANT
GOWN STRL REUS W/TWL 2XL LVL3 (GOWN DISPOSABLE) IMPLANT
GOWN STRL REUS W/TWL LRG LVL3 (GOWN DISPOSABLE) ×3
GOWN STRL REUS W/TWL XL LVL3 (GOWN DISPOSABLE) ×1
HEMOSTAT POWDER KIT SURGIFOAM (HEMOSTASIS) ×2 IMPLANT
KIT BASIN OR (CUSTOM PROCEDURE TRAY) ×2 IMPLANT
KIT TURNOVER KIT B (KITS) ×2 IMPLANT
NDL HYPO 25X1 1.5 SAFETY (NEEDLE) ×2 IMPLANT
NDL SPNL 20GX3.5 QUINCKE YW (NEEDLE) IMPLANT
NEEDLE HYPO 25X1 1.5 SAFETY (NEEDLE) ×1 IMPLANT
NEEDLE SPNL 20GX3.5 QUINCKE YW (NEEDLE) ×1 IMPLANT
NS IRRIG 1000ML POUR BTL (IV SOLUTION) ×2 IMPLANT
PACK LAMINECTOMY NEURO (CUSTOM PROCEDURE TRAY) ×2 IMPLANT
PAD ARMBOARD 7.5X6 YLW CONV (MISCELLANEOUS) ×6 IMPLANT
SPONGE SURGIFOAM ABS GEL SZ50 (HEMOSTASIS) ×2 IMPLANT
STRIP CLOSURE SKIN 1/2X4 (GAUZE/BANDAGES/DRESSINGS) ×2 IMPLANT
SUT VIC AB 0 CT1 18XCR BRD8 (SUTURE) ×2 IMPLANT
SUT VIC AB 0 CT1 8-18 (SUTURE) ×1
SUT VIC AB 2-0 CP2 18 (SUTURE) ×2 IMPLANT
SUT VIC AB 3-0 SH 8-18 (SUTURE) ×2 IMPLANT
TOWEL GREEN STERILE (TOWEL DISPOSABLE) ×2 IMPLANT
TOWEL GREEN STERILE FF (TOWEL DISPOSABLE) ×2 IMPLANT
WATER STERILE IRR 1000ML POUR (IV SOLUTION) ×2 IMPLANT

## 2023-01-11 NOTE — Transfer of Care (Signed)
Immediate Anesthesia Transfer of Care Note  Patient: Bruce [name]  Procedure(s) Performed: Laminectomy and Foraminotomy - Lumbar Three-Four/Lumbar Four-Five, Left Microdiscectomy Lumbar Three-Four (Left: Back)  Patient Location: PACU  Anesthesia Type:General  Level of Consciousness: awake, alert , and oriented  Airway & Oxygen Therapy: Patient Spontanous Breathing and Patient connected to face mask oxygen  Post-op Assessment: Report given to RN and Post -op Vital signs reviewed and stable  Post vital signs: Reviewed and stable  Last Vitals:  Vitals Value Taken Time  BP 129/59 01/11/23 1153  Temp    Pulse 71 01/11/23 1154  Resp 17 01/11/23 1154  SpO2 98 % 01/11/23 1154  Vitals shown include unfiled device data.  Last Pain:  Vitals:   01/11/23 0844  PainSc: 0-No pain         Complications: No notable events documented.

## 2023-01-11 NOTE — H&P (Signed)
Subjective: Patient is a 87 y.o. male admitted for L hip psin. Onset of symptoms was several months ago, gradually worsening since that time.  The pain is rated severe, and is located at the across the lower back and radiates to L hip with leg weakness. The pain is described as aching and occurs all day. The symptoms have been progressive. Symptoms are exacerbated by exercise and standing. MRI or CT showed stenosis L3-4 L4-5 with HNP L3-4 L   Past Medical History:  Diagnosis Date   Arthritis    Atrioventricular block, Mobitz type 1, Wenckebach    Bradycardia    Coronary artery disease    ETT-Myoview EF 51%, inf infarct, no ischemia, low risk   GERD (gastroesophageal reflux disease)    History of echocardiogram    Echo 3/17: EF 55-60%, inferior HK, grade 1 diastolic dysfunctione   History of kidney stones    Hypertension    Macular degeneration    Myocardial infarction (HCC) 2000   with stent   PONV (postoperative nausea and vomiting)    Sleep apnea    cpap in use    Stage 3a chronic kidney disease (CKD) (HCC) 12/02/2021    Past Surgical History:  Procedure Laterality Date   CORONARY ANGIOPLASTY     EYE SURGERY     bilateral cataract surgery with lens implant   KNEE SURGERY Right    LEFT HEART CATHETERIZATION WITH CORONARY ANGIOGRAM N/A 07/01/2011   Procedure: LEFT HEART CATHETERIZATION WITH CORONARY ANGIOGRAM;  Surgeon: Donato Schultz, MD;  [facility] C[facility] LAB;  Service: Cardiovascular;  Laterality: N/A;   left wrist surgery     left wrist fracture   PARTIAL KNEE ARTHROPLASTY Left 08/02/2017   Procedure: LEFT UNICOMPARTMENTAL KNEE;  Surgeon: Durene Romans, MD;  Location: [facility] ORS;  Service: Orthopedics;  Laterality: Left;   PARTIAL KNEE ARTHROPLASTY Right 03/02/2019   Procedure: UNICOMPARTMENTAL KNEE Medially;  Surgeon: Durene Romans, MD;  Location: [facility] ORS;  Service: Orthopedics;  Laterality: Right;  90 mins    Prior to Admission medications   Medication Sig Start Date End Date  Taking? Authorizing Provider  acetaminophen (TYLENOL) 500 MG tablet Take 500-1,000 mg by mouth every 6 (six) hours as needed (pain.).   Yes [provider]  amLODipine (NORVASC) 10 MG tablet Take 10 mg by mouth at bedtime. 04/24/20  Yes [provider]  atorvastatin (LIPITOR) 80 MG tablet Take 80 mg by mouth at bedtime.   Yes [provider]  beta carotene w/minerals (OCUVITE) tablet Take 1 tablet by mouth in the morning.   Yes [provider]  doxazosin (CARDURA) 8 MG tablet Take 8 mg by mouth in the morning.   Yes [provider]  lisinopril (ZESTRIL) 20 MG tablet Take 1 tablet (20 mg total) by mouth daily. Patient taking differently: Take 20 mg by mouth at bedtime. 11/09/22  Yes Jake Bathe, MD  Multiple Vitamin (MULTIVITAMIN WITH MINERALS) TABS tablet Take 1 tablet by mouth in the morning. One A Day Multivitamin   Yes [provider]  omeprazole (PRILOSEC) 40 MG capsule Take 40 mg by mouth daily before breakfast.   Yes [provider]  amoxicillin (AMOXIL) 500 MG capsule Take 2,000 mg by mouth as directed. Take 4 capsules (2000 mg) by mouth 1 hour prior to dental procedure.    [provider]  aspirin 81 MG chewable tablet aspirin 81 mg chewable tablet  Chew 1 tablet every day by oral route.    [provider]   Allergies  Allergen Reactions   Nsaids Other (See Comments)    CKD   Methyldopa Itching and Rash   Plavix [Clopidogrel Bisulfate] Itching and Rash   Spironolactone Rash    Social History   Tobacco Use   Smoking status: Never   Smokeless tobacco: Never  Substance Use Topics   Alcohol use: No    Family History  Problem Relation Age of Onset   Coronary artery disease Father    Kidney disease Sister    Hyperlipidemia Sister    Heart disease Brother    Diabetes Brother    Coronary artery disease Brother    Heart attack Sister      Review of Systems  Positive ROS: neg  All other  systems have been reviewed and were otherwise negative with the exception of those mentioned in the HPI and as above.  Objective: Vital signs in last 24 hours: Temp:  [97.8 F (36.6 C)] 97.8 F (36.6 C) (08/26 0827) Pulse Rate:  [87] 87 (08/26 0827) Resp:  [18] 18 (08/26 0827) BP: (144)/(72) 144/72 (08/26 0827) SpO2:  [97 %] 97 % (08/26 0827) Weight:  [81.6 kg] 81.6 kg (08/26 0827)  General Appearance: Alert, cooperative, no distress, appears stated age Head: Normocephalic, without obvious abnormality, atraumatic Eyes: PERRL, conjunctiva/corneas clear, EOM's intact    Neck: Supple, symmetrical, trachea midline Back: Symmetric, no curvature, ROM normal, no CVA tenderness Lungs:  respirations unlabored Heart: Regular rate and rhythm Abdomen: Soft, non-tender Extremities: Extremities normal, atraumatic, no cyanosis or edema Pulses: 2+ and symmetric all extremities Skin: Skin color, texture, turgor normal, no rashes or lesions  NEUROLOGIC:   Mental status: Alert and oriented x4,  no aphasia, good attention span, fund of knowledge, and memory Motor Exam - grossly normal Sensory Exam - grossly normal Reflexes: 1+ Coordination - grossly normal Gait - grossly normal Balance - grossly normal Cranial Nerves: I: smell Not tested  II: visual acuity  OS: nl    OD: nl  II: visual fields Full to confrontation  II: pupils Equal, round, reactive to light  III,VII: ptosis None  III,IV,VI: extraocular muscles  Full ROM  V: mastication Normal  V: facial light touch sensation  Normal  V,VII: corneal reflex  Present  VII: facial muscle function - upper  Normal  VII: facial muscle function - lower Normal  VIII: hearing Not tested  IX: soft palate elevation  Normal  IX,X: gag reflex Present  XI: trapezius strength  5/5  XI: sternocleidomastoid strength 5/5  XI: neck flexion strength  5/5  XII: tongue strength  Normal    Data Review Lab Results  Component Value Date   WBC 5.0  01/07/2023   HGB 13.8 01/07/2023   HCT 41.9 01/07/2023   MCV 95.7 01/07/2023   PLT 164 01/07/2023   Lab Results  Component Value Date   NA 138 01/07/2023   K 4.3 01/07/2023   CL 105 01/07/2023   CO2 24 01/07/2023   BUN 11 01/07/2023   CREATININE 1.48 (H) 01/07/2023   GLUCOSE 98 01/07/2023   Lab Results  Component Value Date   INR 0.9 01/07/2023    Assessment/Plan:  Estimated body mass index is 29.5 kg/m as calculated from the following:   Height as of this encounter: 5' 5.5" (1.664 m).   Weight as of this encounter: 81.6 kg. Patient admitted for L L3-4 L4-5 DLL with diskectomy. Patient has failed a reasonable attempt at conservative therapy.  I explained the condition and  procedure to the patient and answered any questions.  Patient wishes to proceed with procedure as planned. Understands risks/ benefits and typical outcomes of procedure.   Tia Alert 01/11/2023 9:29 AM

## 2023-01-11 NOTE — Op Note (Signed)
01/11/2023  11:40 AM  PATIENT:  Bruce [name]  87 y.o. male  PRE-OPERATIVE DIAGNOSIS: Lumbar spinal stenosis L3-4 L4-5 with left lateral recess stenosis, left L3-4 lumbar disc herniation, left lower extremity radiculopathy  POST-OPERATIVE DIAGNOSIS:  same  PROCEDURE: Left L3-4 L4-5 hemilaminectomy medial facetectomy foraminotomies with microdiscectomy L3-4 on the left utilizing microscopic dissection  SURGEON:  Marikay Alar, MD  ASSISTANTS: Verlin Dike, FNP  ANESTHESIA:   General  EBL: 30 ml  No intake/output data recorded.  BLOOD ADMINISTERED: none  DRAINS: none  SPECIMEN:  none  INDICATION FOR PROCEDURE: This patient presented with left hip and leg pain with leg weakness. Imaging showed L3-4 disc herniation with lateral recess stenosis and left L4-5 lateral recess stenosis. The patient tried conservative measures without relief. Pain was debilitating. Recommended left L3-4 and L4-5 decompression with discectomy at L3-4 on the left. Patient understood the risks, benefits, and alternatives and potential outcomes and wished to proceed.  PROCEDURE DETAILS: The patient was taken to the operating room and after induction of adequate generalized endotracheal anesthesia, the patient was rolled into the prone position on the Wilson frame and all pressure points were padded. The lumbar region was cleaned and then prepped with DuraPrep and draped in the usual sterile fashion. 5 cc of local anesthesia was injected and then a dorsal midline incision was made and carried down to the lumbo sacral fascia. The fascia was opened and the paraspinous musculature was taken down in a subperiosteal fashion to expose L3-4 and L4-5 on the left. Intraoperative x-ray confirmed my level and all in the room agreed, and then I used a combination of the high-speed drill and the Kerrison punches to perform a hemilaminectomy, medial facetectomy, and foraminotomy at L3 4 and L4-5 on the left. The underlying yellow  ligament was opened and removed in a piecemeal fashion to expose the underlying dura and exiting nerve root. I undercut the lateral recess and dissected down until I was medial to and distal to the pedicle. The nerve root was well decompressed at both levels. We then gently retracted the nerve root medially at L3-4 on the left with a retractor, coagulated the epidural venous vasculature, and found significant subannular disc herniation.  There was a annular rent and we were able to remove herniated disc from the annular rent.  There was still bulging of the annulus.  Incised the annulus and performed a thorough intradiscal discectomy with pituitary rongeurs, until I had a nice decompression of the nerve root and the midline. I then palpated with a coronary dilator along the nerve root and into the foramen to assure adequate decompression. I felt no more compression of the nerve root. I irrigated with saline solution containing bacitracin. Achieved hemostasis with bipolar cautery,  and then closed the fascia with 0 Vicryl. I closed the subcutaneous tissues with 2-0 Vicryl and the subcuticular tissues with 3-0 Vicryl. The skin was then closed with benzoin and Steri-Strips. The drapes were removed, a sterile dressing was applied.  My nurse practitioner was involved in the exposure, safe retraction of the neural elements, the disc work and the closure. the patient was awakened from general anesthesia and transferred to the recovery room in stable condition. At the end of the procedure all sponge, needle and instrument counts were correct.    PLAN OF CARE: Admit for overnight observation  PATIENT DISPOSITION:  PACU - hemodynamically stable.   Delay start of Pharmacological VTE agent (>24hrs) due to surgical blood loss or risk of  bleeding:  yes

## 2023-01-11 NOTE — Anesthesia Procedure Notes (Addendum)
Procedure Name: Intubation Date/Time: 01/11/2023 10:18 AM  Performed by: Debbe Odea, CRNAPre-anesthesia Checklist: Patient identified, Emergency Drugs available, Suction available and Patient being monitored Patient Re-evaluated:Patient Re-evaluated prior to induction Oxygen Delivery Method: Circle System Utilized Preoxygenation: Pre-oxygenation with 100% oxygen Induction Type: IV induction Ventilation: Mask ventilation without difficulty Laryngoscope Size: Miller and 2 Grade View: Grade I Tube type: Oral Tube size: 7.5 mm Number of attempts: 1 Airway Equipment and Method: Stylet and Oral airway Placement Confirmation: ETT inserted through vocal cords under direct vision, positive ETCO2 and breath sounds checked- equal and bilateral Secured at: 24 cm Tube secured with: Tape Dental Injury: Teeth and Oropharynx as per pre-operative assessment

## 2023-01-12 ENCOUNTER — Encounter (HOSPITAL_COMMUNITY): Payer: Self-pay | Admitting: Neurological Surgery

## 2023-01-12 DIAGNOSIS — Z79899 Other long term (current) drug therapy: Secondary | ICD-10-CM | POA: Diagnosis not present

## 2023-01-12 DIAGNOSIS — M5116 Intervertebral disc disorders with radiculopathy, lumbar region: Secondary | ICD-10-CM | POA: Diagnosis not present

## 2023-01-12 DIAGNOSIS — I129 Hypertensive chronic kidney disease with stage 1 through stage 4 chronic kidney disease, or unspecified chronic kidney disease: Secondary | ICD-10-CM | POA: Diagnosis not present

## 2023-01-12 DIAGNOSIS — N1831 Chronic kidney disease, stage 3a: Secondary | ICD-10-CM | POA: Diagnosis not present

## 2023-01-12 DIAGNOSIS — Z7982 Long term (current) use of aspirin: Secondary | ICD-10-CM | POA: Diagnosis not present

## 2023-01-12 DIAGNOSIS — I2510 Atherosclerotic heart disease of native coronary artery without angina pectoris: Secondary | ICD-10-CM | POA: Diagnosis not present

## 2023-01-12 DIAGNOSIS — Z96653 Presence of artificial knee joint, bilateral: Secondary | ICD-10-CM | POA: Diagnosis not present

## 2023-01-12 DIAGNOSIS — M48061 Spinal stenosis, lumbar region without neurogenic claudication: Secondary | ICD-10-CM | POA: Diagnosis not present

## 2023-01-12 MED ORDER — HYDROCODONE-ACETAMINOPHEN 7.5-325 MG PO TABS: 1.0000 | ORAL_TABLET | ORAL | 0 refills | Status: DC | PRN

## 2023-01-12 MED ORDER — METHOCARBAMOL 500 MG PO TABS
500.0000 mg | ORAL_TABLET | Freq: Three times a day (TID) | ORAL | Status: AC
Start: 2023-01-12 — End: ?

## 2023-01-12 MED FILL — Thrombin For Soln 20000 Unit: CUTANEOUS | Qty: 1 | Status: AC

## 2023-01-12 NOTE — Anesthesia Postprocedure Evaluation (Signed)
Anesthesia Post Note  Patient: Bruce [name]  Procedure(s) Performed: Laminectomy and Foraminotomy - Lumbar Three-Four/Lumbar Four-Five, Left Microdiscectomy Lumbar Three-Four (Left: Back)     Patient location during evaluation: PACU Anesthesia Type: General Level of consciousness: awake and alert Pain management: pain level controlled Vital Signs Assessment: post-procedure vital signs reviewed and stable Respiratory status: spontaneous breathing, nonlabored ventilation, respiratory function stable and patient connected to nasal cannula oxygen Cardiovascular status: blood pressure returned to baseline and stable Postop Assessment: no apparent nausea or vomiting Anesthetic complications: no   No notable events documented.  Last Vitals:  Vitals:   01/12/23 0511 01/12/23 0723  BP: 117/66 114/60  Pulse: (!) 56 64  Resp: 20 16  Temp: 36.7 C 36.7 C  SpO2: 96% 95%    Last Pain:  Vitals:   01/12/23 0723  TempSrc: Oral  PainSc:                  Collene Schlichter

## 2023-01-12 NOTE — Evaluation (Signed)
Occupational Therapy Evaluation Patient Details Name: Bruce [name] MRN: [id number] DOB: 1936-02-12 Today's Date: 01/12/2023   History of Present Illness 87 yo male s/p 8/26 L Hemilaminectomy medial facetectomy foraminotomies with microdiscectomy L3-4   Clinical Impression   Patient evaluated by Occupational Therapy with no further acute OT needs identified. All education has been completed and the patient has no further questions. See below for any follow-up Occupational Therapy or equipment needs. OT to sign off. Thank you for referral.         If plan is discharge home, recommend the following: Assistance with cooking/housework;Assist for transportation    Functional Status Assessment  Patient has had a recent decline in their functional status and demonstrates the ability to make significant improvements in function in a reasonable and predictable amount of time.  Equipment Recommendations  None recommended by OT (rw in room already delivered)    Recommendations for Other Services       Precautions / Restrictions Precautions Precautions: Back      Mobility Bed Mobility Overal bed mobility: Modified Independent             General bed mobility comments: cues for hand placement to keep from trunk rotation but good return demo    Transfers Overall transfer level: Modified independent Equipment used: Rolling walker (2 wheels)                      Balance Overall balance assessment: Mild deficits observed, not formally tested                                         ADL either performed or assessed with clinical judgement   ADL Overall ADL's : Modified independent                                       General ADL Comments: demonstrates dressing for home with reacher and wife present. demonstrates bed level mobility. pt educated on use of RW over top the toilet for support.  Back handout provided and reviewed adls in  detail. Pt educated on:, avoid sitting for long periods of time, correct bed positioning for sleeping, correct sequence for bed mobility, avoiding lifting more than 5 pounds and never wash directly over incision. All education is complete and patient indicates understanding.    Vision Baseline Vision/History: 0 No visual deficits Ability to See in Adequate Light: 0 Adequate Patient Visual Report: No change from baseline Vision Assessment?: No apparent visual deficits     Perception Perception: Not tested       Praxis Praxis: Not tested       Pertinent Vitals/Pain Pain Assessment Pain Assessment: Faces Faces Pain Scale: Hurts little more Pain Location: back Pain Descriptors / Indicators: Grimacing, Operative site guarding Pain Intervention(s): Monitored during session, Premedicated before session, Repositioned     Extremity/Trunk Assessment Upper Extremity Assessment Upper Extremity Assessment: Overall WFL for tasks assessed   Lower Extremity Assessment Lower Extremity Assessment: Overall WFL for tasks assessed   Cervical / Trunk Assessment Cervical / Trunk Assessment: Back Surgery   Communication Communication Communication: No apparent difficulties   Cognition Arousal: Alert Behavior During Therapy: WFL for tasks assessed/performed Overall Cognitive Status: Within Functional Limits for tasks assessed  General Comments  incision dry and intact at this time    Exercises     Shoulder Instructions      Home Living Family/patient expects to be discharged to:: Private residence Living Arrangements: Spouse/significant other Available Help at Discharge: Family;Available 24 hours/day Type of Home: House Home Access: Stairs to enter [employer] of Steps: 4 Entrance Stairs-Rails: Right;Left;Can reach both Home Layout: One level     Bathroom Shower/Tub: Chief Strategy Officer: Standard      Home Equipment: Grab bars - tub/shower;Hand held shower head;Cane - single point   Additional Comments: no animals, has a garden that he has help to take care of at present      Prior Functioning/Environment Prior Level of Function : Independent/Modified Independent;Driving                        OT Problem List:        OT Treatment/Interventions:      OT Goals(Current goals can be found in the care plan section) Acute Rehab OT Goals Patient Stated Goal: to go home today  OT Frequency:      Co-evaluation              AM-PAC OT "6 Clicks" Daily Activity     Outcome Measure Help from another person eating meals?: None Help from another person taking care of personal grooming?: None Help from another person toileting, which includes using toliet, bedpan, or urinal?: None Help from another person bathing (including washing, rinsing, drying)?: None Help from another person to put on and taking off regular upper body clothing?: None Help from another person to put on and taking off regular lower body clothing?: None 6 Click Score: 24   End of Session Equipment Utilized During Treatment: Rolling walker (2 wheels) Nurse Communication: Mobility status  Activity Tolerance: Patient tolerated treatment well Patient left: in bed;with call bell/phone within reach;with family/visitor present  OT Visit Diagnosis: Unsteadiness on feet (R26.81)                Time: 3762-8315 OT Time Calculation (min): 17 min Charges:  OT General Charges $OT Visit: 1 Visit OT Evaluation $OT Eval Moderate Complexity: 1 Mod   Brynn, OTR/L  Acute Rehabilitation Services Office: [phone] .   Mateo Flow 01/12/2023, 11:12 AM

## 2023-01-12 NOTE — Progress Notes (Signed)
Patient alert and oriented, mae's well, voiding adequate amount of urine, swallowing without difficulty, no c/o pain at time of discharge. Patient discharged home with family. Script and discharged instructions given to patient. Patient and family stated understanding of instructions given. Patient has an appointment with Dr. Lineback °

## 2023-01-12 NOTE — Discharge Summary (Signed)
Physician Discharge Summary  Patient ID: Bruce [name] MRN: [id number] DOB/AGE: 12-24-1935 87 y.o.  Admit date: 01/11/2023 Discharge date: 01/12/2023  Admission Diagnoses:  Lumbar spinal stenosis L3-4 L4-5 with left lateral recess stenosis, left L3-4 lumbar disc herniation, left lower extremity radiculopathy    Discharge Diagnoses: same   Discharged Condition: good  Hospital Course: The patient was admitted on 01/11/2023 and taken to the operating room where the patient underwent lumbar laminectomy left L3-4, L4-5. The patient tolerated the procedure well and was taken to the recovery room and then to the floor in stable condition. The hospital course was routine. There were no complications. The wound remained clean dry and intact. Pt had appropriate back soreness. No complaints of leg pain or new N/T/W. The patient remained afebrile with stable vital signs, and tolerated a regular diet. The patient continued to increase activities, and pain was well controlled with oral pain medications.   Consults: None  Significant Diagnostic Studies:  Results for orders placed or performed during the hospital encounter of 01/07/23  Surgical pcr screen   Specimen: Nasal Mucosa; Nasal Swab  Result Value Ref Range   MRSA, PCR NEGATIVE NEGATIVE   Staphylococcus aureus NEGATIVE NEGATIVE  Protime-INR  Result Value Ref Range   Prothrombin Time 12.7 11.4 - 15.2 seconds   INR 0.9 0.8 - 1.2  Basic metabolic panel per protocol  Result Value Ref Range   Sodium 138 135 - 145 mmol/L   Potassium 4.3 3.5 - 5.1 mmol/L   Chloride 105 98 - 111 mmol/L   CO2 24 22 - 32 mmol/L   Glucose, Bld 98 70 - 99 mg/dL   BUN 11 8 - 23 mg/dL   Creatinine, Ser 4.09 (H) 0.61 - 1.24 mg/dL   Calcium 8.9 8.9 - 81.1 mg/dL   GFR, Estimated 46 (L) >60 mL/min   Anion gap 9 5 - 15  CBC per protocol  Result Value Ref Range   WBC 5.0 4.0 - 10.5 K/uL   RBC 4.38 4.22 - 5.81 MIL/uL   Hemoglobin 13.8 13.0 - 17.0 g/dL   HCT 91.4 78.2  - 95.6 %   MCV 95.7 80.0 - 100.0 fL   MCH 31.5 26.0 - 34.0 pg   MCHC 32.9 30.0 - 36.0 g/dL   RDW 21.3 08.6 - 57.8 %   Platelets 164 150 - 400 K/uL   nRBC 0.0 0.0 - 0.2 %    DG Lumbar Spine 2-3 Views  Result Date: 01/11/2023 CLINICAL DATA:  Elective surgery. Intraoperative imaging for localization. EXAM: LUMBAR SPINE - 2-3 VIEW COMPARISON:  MRI lumbar spine 10/15/2022 FINDINGS: Portable cross-table lateral views of the lumbar spine are obtained intraoperatively for surgical localization purposes. Film 1 demonstrates a localization marker placed posterior to the L4-5 interspace level. Film 2 demonstrates localization markers placed over the L4-5 and L3-4 spinous processes. Degenerative changes are demonstrated in the L4-5 and L5-S1 levels. IMPRESSION: Intraoperative imaging obtained for surgical localization purposes. Localization markers are positioned as described above. Electronically Signed   By: Burman Nieves M.D.   On: 01/11/2023 17:17    Antibiotics:  Anti-infectives (From admission, onward)    Start     Dose/Rate Route Frequency Ordered Stop   01/11/23 1315  ceFAZolin (ANCEF) IVPB 2g/100 mL premix        2 g 200 mL/hr over 30 Minutes Intravenous Every 8 hours 01/11/23 1304 01/11/23 2105   01/11/23 0830  ceFAZolin (ANCEF) IVPB 2g/100 mL premix  2 g 200 mL/hr over 30 Minutes Intravenous On call to O.R. 01/11/23 2956 01/11/23 1025       Discharge Exam: Blood pressure 114/60, pulse 64, temperature 98.1 F (36.7 C), temperature source Oral, resp. rate 16, height 5' 5.5" (1.664 m), weight 81.6 kg, SpO2 95%. Neurologic: Grossly normal Ambulating and voiding well incision cdi   Discharge Medications:   Allergies as of 01/12/2023       Reactions   Nsaids Other (See Comments)   CKD   Methyldopa Itching, Rash   Plavix [clopidogrel Bisulfate] Itching, Rash   Spironolactone Rash        Medication List     TAKE these medications    acetaminophen 500 MG  tablet Commonly known as: TYLENOL Take 500-1,000 mg by mouth every 6 (six) hours as needed (pain.).   amLODipine 10 MG tablet Commonly known as: NORVASC Take 10 mg by mouth at bedtime.   amoxicillin 500 MG capsule Commonly known as: AMOXIL Take 2,000 mg by mouth as directed. Take 4 capsules (2000 mg) by mouth 1 hour prior to dental procedure.   aspirin 81 MG chewable tablet aspirin 81 mg chewable tablet  Chew 1 tablet every day by oral route.   atorvastatin 80 MG tablet Commonly known as: LIPITOR Take 80 mg by mouth at bedtime.   beta carotene w/minerals tablet Take 1 tablet by mouth in the morning.   doxazosin 8 MG tablet Commonly known as: CARDURA Take 8 mg by mouth in the morning.   HYDROcodone-acetaminophen 7.5-325 MG tablet Commonly known as: NORCO Take 1 tablet by mouth every 4 (four) hours as needed for moderate pain.   lisinopril 20 MG tablet Commonly known as: ZESTRIL Take 1 tablet (20 mg total) by mouth daily. What changed: when to take this   multivitamin with minerals Tabs tablet Take 1 tablet by mouth in the morning. One A Day Multivitamin   omeprazole 40 MG capsule Commonly known as: PRILOSEC Take 40 mg by mouth daily before breakfast.        Disposition: home   Final Dx: lumbar laminectomy left L3-4, L4-5  Discharge Instructions      Remove dressing in 72 hours   Complete by: As directed    Call MD for:   Complete by: As directed    Call MD for:  difficulty breathing, headache or visual disturbances   Complete by: As directed    Call MD for:  hives   Complete by: As directed    Call MD for:  persistant dizziness or light-headedness   Complete by: As directed    Call MD for:  persistant nausea and vomiting   Complete by: As directed    Call MD for:  redness, tenderness, or signs of infection (pain, swelling, redness, odor or green/yellow discharge around incision site)   Complete by: As directed    Call MD for:  severe uncontrolled pain    Complete by: As directed    Call MD for:  temperature >100.4   Complete by: As directed    Diet - low sodium heart healthy   Complete by: As directed    Driving Restrictions   Complete by: As directed    No driving for 2 weeks, no riding in the car for 1 week   Increase activity slowly   Complete by: As directed    Lifting restrictions   Complete by: As directed    No lifting more than 8 lbs  Signed: Tiana Loft Muscab Brenneman 01/12/2023, 7:34 AM

## 2023-01-12 NOTE — TOC Transition Note (Signed)
Transition of Care [facility]) - CM/SW Discharge Note   Patient Details  Name: Bruce [name] MRN: [id number] Date of Birth: [birth year]  [facility] CM/SW Contact:  Kermit Balo, RN Phone Number: 01/12/2023, 9:57 AM   Clinical Narrative:    Pt is discharging home with home health services through [facility] home health. Information on the AVS. [facility] will contact him for the first home visit. Pt has supervision at home and transportation to home.   Final next level of care: Home w Home Health Services Barriers to Discharge: No Barriers Identified   Patient Goals and CMS Choice CMS Medicare.gov Compare Post Acute Care list provided to:: Patient Choice offered to / list presented to : Patient  Discharge Placement                         Discharge Plan and Services Additional resources added to the After Visit Summary for                            [facility] Arranged: OT, PT [facility] Agency: [facility] Date [facility] Agency Contacted: 01/12/23   Representative spoke with at [facility] Agency: Tresa Endo  Social Determinants of Health (SDOH) Interventions SDOH Screenings   Tobacco Use: Low Risk  (01/11/2023)     Readmission Risk Interventions     No data to display

## 2023-01-12 NOTE — Evaluation (Signed)
Physical Therapy Evaluation  Patient Details Name: Bruce [name] MRN: [id number] DOB: December 08, 1935 Today's Date: 01/12/2023  History of Present Illness  Pt is an 87 y/o male who prsents s/p L Hemilaminectomy medial facetectomy foraminotomies with microdiscectomy L3-4 on 01/11/2023. PMH significant for AV block, bradycardia, CAD, HTN, macular degeneration, MI, CKD IIIa, R knee surgery, L wrist surgery s/p fracture, B partial knee replacement 2019 (L) 2020 (R).   Clinical Impression  Pt admitted with above diagnosis. At the time of PT eval, pt was able to demonstrate transfers and ambulation with gross supervision for safety and RW for support. Pt was educated on precautions, positioning recommendations, appropriate activity progression, and car transfer. Pt currently with functional limitations due to the deficits listed below (see PT Problem List). Pt will benefit from skilled PT to increase their independence and safety with mobility to allow discharge to the venue listed below.          If plan is discharge home, recommend the following: A little help with walking and/or transfers;A little help with bathing/dressing/bathroom;Assistance with cooking/housework;Assist for transportation;Help with stairs or ramp for entrance   Can travel by private vehicle        Equipment Recommendations Rolling walker (2 wheels)  Recommendations for Other Services       Functional Status Assessment Patient has had a recent decline in their functional status and demonstrates the ability to make significant improvements in function in a reasonable and predictable amount of time.     Precautions / Restrictions Precautions Precautions: Back;Fall Precaution Booklet Issued: Yes (comment) Precaution Comments: Reviewed handout and pt was cued for precautions during functional mobility. Required Braces or Orthoses:  (No brace needed per orders) Restrictions Weight Bearing Restrictions: No      Mobility   Bed Mobility Overal bed mobility: Needs Assistance Bed Mobility: Rolling, Sidelying to Sit Rolling: Supervision Sidelying to sit: Supervision       General bed mobility comments: VC's for optimal log roll technique. HOB flat and rails lowered to simulate home environment.    Transfers Overall transfer level: Needs assistance Equipment used: Rolling walker (2 wheels) Transfers: Sit to/from Stand Sit to Stand: Supervision           General transfer comment: Light supervision for safety as pt powered up to full stand. VC's for hand placement on seated surface for safety.    Ambulation/Gait Ambulation/Gait assistance: Supervision Gait Distance (Feet): 300 Feet Assistive device: Rolling walker (2 wheels) Gait Pattern/deviations: Step-through pattern, Decreased stride length, Trunk flexed Gait velocity: Decreased Gait velocity interpretation: 1.31 - 2.62 ft/sec, indicative of limited community ambulator   General Gait Details: VC's fo rimproved posture, closer walker proximity and forward gaze. No assist required and no overt LOB noted.  Stairs Stairs: Yes Stairs assistance: Contact guard assist Stair Management: Two rails, Step to pattern, Forwards Number of Stairs: 4 General stair comments: VC's for sequencing and general safety. No assist required.  Wheelchair Mobility     Tilt Bed    Modified Rankin (Stroke Patients Only)       Balance Overall balance assessment: Mild deficits observed, not formally tested                                           Pertinent Vitals/Pain Pain Assessment Pain Assessment: Faces Faces Pain Scale: Hurts little more Pain Location: back Pain Descriptors / Indicators: Grimacing, Operative  site guarding Pain Intervention(s): Limited activity within patient's tolerance, Monitored during session, Repositioned    Home Living Family/patient expects to be discharged to:: Private residence Living Arrangements:  Spouse/significant other Available Help at Discharge: Family;Available 24 hours/day Type of Home: House Home Access: Stairs to enter Entrance Stairs-Rails: Right;Left;Can reach both Entrance Stairs-Number of Steps: 4   Home Layout: One level Home Equipment: Grab bars - tub/shower;Hand held shower head;Cane - single point Additional Comments: no animals, has a garden that he has help to take care of at present    Prior Function Prior Level of Function : Independent/Modified Independent;Driving             Mobility Comments: active in his vegetable garden       Extremity/Trunk Assessment   Upper Extremity Assessment Upper Extremity Assessment: Defer to OT evaluation    Lower Extremity Assessment Lower Extremity Assessment: Generalized weakness (Mild; consistent with pre-op diagnosis)    Cervical / Trunk Assessment Cervical / Trunk Assessment: Back Surgery  Communication   Communication Communication: No apparent difficulties  Cognition Arousal: Alert Behavior During Therapy: WFL for tasks assessed/performed Overall Cognitive Status: Within Functional Limits for tasks assessed                                          General Comments General comments (skin integrity, edema, etc.): incision dry and intact at this time    Exercises     Assessment/Plan    PT Assessment Patient needs continued PT services  PT Problem List Decreased strength;Decreased activity tolerance;Decreased balance;Decreased mobility;Decreased knowledge of use of DME;Decreased safety awareness;Decreased knowledge of precautions;Pain       PT Treatment Interventions DME instruction;Gait training;Stair training;Functional mobility training;Therapeutic activities;Therapeutic exercise;Balance training;Patient/family education    PT Goals (Current goals can be found in the Care Plan section)  Acute Rehab PT Goals Patient Stated Goal: Home today PT Goal Formulation: With  patient/family Time For Goal Achievement: 01/19/23 Potential to Achieve Goals: Good    Frequency Min 1X/week     Co-evaluation               AM-PAC PT "6 Clicks" Mobility  Outcome Measure                  End of Session Equipment Utilized During Treatment: Gait belt Activity Tolerance: Patient tolerated treatment well Patient left: in bed;with call bell/phone within reach;with family/visitor present Nurse Communication: Mobility status PT Visit Diagnosis: Unsteadiness on feet (R26.81);Pain Pain - part of body:  (back)    Time: 2841-3244 PT Time Calculation (min) (ACUTE ONLY): 20 min   Charges:   PT Evaluation $PT Eval Low Complexity: 1 Low   PT General Charges $$ ACUTE PT VISIT: 1 Visit         Conni Slipper, PT, DPT Acute Rehabilitation Services Secure Chat Preferred Office: [phone]   Marylynn Pearson 01/12/2023, 11:43 AM

## 2023-01-13 DIAGNOSIS — Z4789 Encounter for other orthopedic aftercare: Secondary | ICD-10-CM | POA: Diagnosis not present

## 2023-01-13 DIAGNOSIS — M5410 Radiculopathy, site unspecified: Secondary | ICD-10-CM | POA: Diagnosis not present

## 2023-01-13 DIAGNOSIS — I77819 Aortic ectasia, unspecified site: Secondary | ICD-10-CM | POA: Diagnosis not present

## 2023-01-13 DIAGNOSIS — G4730 Sleep apnea, unspecified: Secondary | ICD-10-CM | POA: Diagnosis not present

## 2023-01-13 DIAGNOSIS — M48061 Spinal stenosis, lumbar region without neurogenic claudication: Secondary | ICD-10-CM | POA: Diagnosis not present

## 2023-01-13 DIAGNOSIS — M5126 Other intervertebral disc displacement, lumbar region: Secondary | ICD-10-CM | POA: Diagnosis not present

## 2023-01-13 DIAGNOSIS — N1831 Chronic kidney disease, stage 3a: Secondary | ICD-10-CM | POA: Diagnosis not present

## 2023-01-13 DIAGNOSIS — E663 Overweight: Secondary | ICD-10-CM | POA: Diagnosis not present

## 2023-01-13 DIAGNOSIS — I129 Hypertensive chronic kidney disease with stage 1 through stage 4 chronic kidney disease, or unspecified chronic kidney disease: Secondary | ICD-10-CM | POA: Diagnosis not present

## 2023-01-19 DIAGNOSIS — M48061 Spinal stenosis, lumbar region without neurogenic claudication: Secondary | ICD-10-CM | POA: Diagnosis not present

## 2023-01-19 DIAGNOSIS — Z4789 Encounter for other orthopedic aftercare: Secondary | ICD-10-CM | POA: Diagnosis not present

## 2023-01-19 DIAGNOSIS — E663 Overweight: Secondary | ICD-10-CM | POA: Diagnosis not present

## 2023-01-19 DIAGNOSIS — N1831 Chronic kidney disease, stage 3a: Secondary | ICD-10-CM | POA: Diagnosis not present

## 2023-01-19 DIAGNOSIS — M5126 Other intervertebral disc displacement, lumbar region: Secondary | ICD-10-CM | POA: Diagnosis not present

## 2023-01-19 DIAGNOSIS — G4730 Sleep apnea, unspecified: Secondary | ICD-10-CM | POA: Diagnosis not present

## 2023-01-19 DIAGNOSIS — M5410 Radiculopathy, site unspecified: Secondary | ICD-10-CM | POA: Diagnosis not present

## 2023-01-19 DIAGNOSIS — I77819 Aortic ectasia, unspecified site: Secondary | ICD-10-CM | POA: Diagnosis not present

## 2023-01-19 DIAGNOSIS — I129 Hypertensive chronic kidney disease with stage 1 through stage 4 chronic kidney disease, or unspecified chronic kidney disease: Secondary | ICD-10-CM | POA: Diagnosis not present

## 2023-01-20 DIAGNOSIS — E663 Overweight: Secondary | ICD-10-CM | POA: Diagnosis not present

## 2023-01-20 DIAGNOSIS — M48061 Spinal stenosis, lumbar region without neurogenic claudication: Secondary | ICD-10-CM | POA: Diagnosis not present

## 2023-01-20 DIAGNOSIS — I129 Hypertensive chronic kidney disease with stage 1 through stage 4 chronic kidney disease, or unspecified chronic kidney disease: Secondary | ICD-10-CM | POA: Diagnosis not present

## 2023-01-20 DIAGNOSIS — G4730 Sleep apnea, unspecified: Secondary | ICD-10-CM | POA: Diagnosis not present

## 2023-01-20 DIAGNOSIS — N1831 Chronic kidney disease, stage 3a: Secondary | ICD-10-CM | POA: Diagnosis not present

## 2023-01-20 DIAGNOSIS — I77819 Aortic ectasia, unspecified site: Secondary | ICD-10-CM | POA: Diagnosis not present

## 2023-01-20 DIAGNOSIS — M5410 Radiculopathy, site unspecified: Secondary | ICD-10-CM | POA: Diagnosis not present

## 2023-01-20 DIAGNOSIS — Z4789 Encounter for other orthopedic aftercare: Secondary | ICD-10-CM | POA: Diagnosis not present

## 2023-01-20 DIAGNOSIS — M5126 Other intervertebral disc displacement, lumbar region: Secondary | ICD-10-CM | POA: Diagnosis not present

## 2023-01-22 DIAGNOSIS — E663 Overweight: Secondary | ICD-10-CM | POA: Diagnosis not present

## 2023-01-22 DIAGNOSIS — M5126 Other intervertebral disc displacement, lumbar region: Secondary | ICD-10-CM | POA: Diagnosis not present

## 2023-01-22 DIAGNOSIS — M5410 Radiculopathy, site unspecified: Secondary | ICD-10-CM | POA: Diagnosis not present

## 2023-01-22 DIAGNOSIS — N1831 Chronic kidney disease, stage 3a: Secondary | ICD-10-CM | POA: Diagnosis not present

## 2023-01-22 DIAGNOSIS — I77819 Aortic ectasia, unspecified site: Secondary | ICD-10-CM | POA: Diagnosis not present

## 2023-01-22 DIAGNOSIS — I129 Hypertensive chronic kidney disease with stage 1 through stage 4 chronic kidney disease, or unspecified chronic kidney disease: Secondary | ICD-10-CM | POA: Diagnosis not present

## 2023-01-22 DIAGNOSIS — M48061 Spinal stenosis, lumbar region without neurogenic claudication: Secondary | ICD-10-CM | POA: Diagnosis not present

## 2023-01-22 DIAGNOSIS — G4730 Sleep apnea, unspecified: Secondary | ICD-10-CM | POA: Diagnosis not present

## 2023-01-22 DIAGNOSIS — Z4789 Encounter for other orthopedic aftercare: Secondary | ICD-10-CM | POA: Diagnosis not present

## 2023-01-25 DIAGNOSIS — N1831 Chronic kidney disease, stage 3a: Secondary | ICD-10-CM | POA: Diagnosis not present

## 2023-01-25 DIAGNOSIS — M5410 Radiculopathy, site unspecified: Secondary | ICD-10-CM | POA: Diagnosis not present

## 2023-01-25 DIAGNOSIS — M48061 Spinal stenosis, lumbar region without neurogenic claudication: Secondary | ICD-10-CM | POA: Diagnosis not present

## 2023-01-25 DIAGNOSIS — I129 Hypertensive chronic kidney disease with stage 1 through stage 4 chronic kidney disease, or unspecified chronic kidney disease: Secondary | ICD-10-CM | POA: Diagnosis not present

## 2023-01-25 DIAGNOSIS — M5126 Other intervertebral disc displacement, lumbar region: Secondary | ICD-10-CM | POA: Diagnosis not present

## 2023-01-25 DIAGNOSIS — I77819 Aortic ectasia, unspecified site: Secondary | ICD-10-CM | POA: Diagnosis not present

## 2023-01-25 DIAGNOSIS — Z4789 Encounter for other orthopedic aftercare: Secondary | ICD-10-CM | POA: Diagnosis not present

## 2023-01-25 DIAGNOSIS — G4730 Sleep apnea, unspecified: Secondary | ICD-10-CM | POA: Diagnosis not present

## 2023-01-25 DIAGNOSIS — E663 Overweight: Secondary | ICD-10-CM | POA: Diagnosis not present

## 2023-01-27 DIAGNOSIS — N1831 Chronic kidney disease, stage 3a: Secondary | ICD-10-CM | POA: Diagnosis not present

## 2023-01-27 DIAGNOSIS — I77819 Aortic ectasia, unspecified site: Secondary | ICD-10-CM | POA: Diagnosis not present

## 2023-01-27 DIAGNOSIS — Z4789 Encounter for other orthopedic aftercare: Secondary | ICD-10-CM | POA: Diagnosis not present

## 2023-01-27 DIAGNOSIS — M48061 Spinal stenosis, lumbar region without neurogenic claudication: Secondary | ICD-10-CM | POA: Diagnosis not present

## 2023-01-27 DIAGNOSIS — M5410 Radiculopathy, site unspecified: Secondary | ICD-10-CM | POA: Diagnosis not present

## 2023-01-27 DIAGNOSIS — E663 Overweight: Secondary | ICD-10-CM | POA: Diagnosis not present

## 2023-01-27 DIAGNOSIS — M5126 Other intervertebral disc displacement, lumbar region: Secondary | ICD-10-CM | POA: Diagnosis not present

## 2023-01-27 DIAGNOSIS — G4730 Sleep apnea, unspecified: Secondary | ICD-10-CM | POA: Diagnosis not present

## 2023-01-27 DIAGNOSIS — I129 Hypertensive chronic kidney disease with stage 1 through stage 4 chronic kidney disease, or unspecified chronic kidney disease: Secondary | ICD-10-CM | POA: Diagnosis not present

## 2023-02-09 DIAGNOSIS — M5410 Radiculopathy, site unspecified: Secondary | ICD-10-CM | POA: Diagnosis not present

## 2023-02-09 DIAGNOSIS — M5126 Other intervertebral disc displacement, lumbar region: Secondary | ICD-10-CM | POA: Diagnosis not present

## 2023-02-09 DIAGNOSIS — I77819 Aortic ectasia, unspecified site: Secondary | ICD-10-CM | POA: Diagnosis not present

## 2023-02-09 DIAGNOSIS — M48061 Spinal stenosis, lumbar region without neurogenic claudication: Secondary | ICD-10-CM | POA: Diagnosis not present

## 2023-02-09 DIAGNOSIS — N1831 Chronic kidney disease, stage 3a: Secondary | ICD-10-CM | POA: Diagnosis not present

## 2023-02-09 DIAGNOSIS — G4730 Sleep apnea, unspecified: Secondary | ICD-10-CM | POA: Diagnosis not present

## 2023-02-09 DIAGNOSIS — E663 Overweight: Secondary | ICD-10-CM | POA: Diagnosis not present

## 2023-02-09 DIAGNOSIS — Z4789 Encounter for other orthopedic aftercare: Secondary | ICD-10-CM | POA: Diagnosis not present

## 2023-02-09 DIAGNOSIS — I129 Hypertensive chronic kidney disease with stage 1 through stage 4 chronic kidney disease, or unspecified chronic kidney disease: Secondary | ICD-10-CM | POA: Diagnosis not present

## 2023-02-10 DIAGNOSIS — N1831 Chronic kidney disease, stage 3a: Secondary | ICD-10-CM | POA: Diagnosis not present

## 2023-02-11 DIAGNOSIS — D3000 Benign neoplasm of unspecified kidney: Secondary | ICD-10-CM | POA: Diagnosis not present

## 2023-02-16 DIAGNOSIS — D49511 Neoplasm of unspecified behavior of right kidney: Secondary | ICD-10-CM | POA: Diagnosis not present

## 2023-02-16 DIAGNOSIS — N281 Cyst of kidney, acquired: Secondary | ICD-10-CM | POA: Diagnosis not present

## 2023-02-16 DIAGNOSIS — K449 Diaphragmatic hernia without obstruction or gangrene: Secondary | ICD-10-CM | POA: Diagnosis not present

## 2023-02-16 DIAGNOSIS — N200 Calculus of kidney: Secondary | ICD-10-CM | POA: Diagnosis not present

## 2023-02-17 DIAGNOSIS — N2889 Other specified disorders of kidney and ureter: Secondary | ICD-10-CM | POA: Diagnosis not present

## 2023-02-17 DIAGNOSIS — D631 Anemia in chronic kidney disease: Secondary | ICD-10-CM | POA: Diagnosis not present

## 2023-02-17 DIAGNOSIS — N281 Cyst of kidney, acquired: Secondary | ICD-10-CM | POA: Diagnosis not present

## 2023-02-17 DIAGNOSIS — N1831 Chronic kidney disease, stage 3a: Secondary | ICD-10-CM | POA: Diagnosis not present

## 2023-02-17 DIAGNOSIS — I129 Hypertensive chronic kidney disease with stage 1 through stage 4 chronic kidney disease, or unspecified chronic kidney disease: Secondary | ICD-10-CM | POA: Diagnosis not present

## 2023-03-01 DIAGNOSIS — D49511 Neoplasm of unspecified behavior of right kidney: Secondary | ICD-10-CM | POA: Diagnosis not present

## 2023-03-12 ENCOUNTER — Ambulatory Visit: Payer: Medicare HMO | Admitting: Plastic Surgery

## 2023-03-12 ENCOUNTER — Encounter: Payer: Self-pay | Admitting: Plastic Surgery

## 2023-03-12 VITALS — BP 105/59 | HR 68 | Ht 61.2 in | Wt 179.6 lb

## 2023-03-12 DIAGNOSIS — Z9889 Other specified postprocedural states: Secondary | ICD-10-CM

## 2023-03-12 DIAGNOSIS — S21209A Unspecified open wound of unspecified back wall of thorax without penetration into thoracic cavity, initial encounter: Secondary | ICD-10-CM | POA: Insufficient documentation

## 2023-03-12 NOTE — Progress Notes (Signed)
Patient ID: Bruce [name], male    DOB: 1936-02-05, 87 y.o.   MRN: [id number]   Chief Complaint  Patient presents with   Advice Only   Skin Problem    The patient is an 87 year old male here with his family for evaluation of his back.  He underwent a left L3-4 and L4-5 hemilaminectomy, medial facetectomy with microdiscectomy on August 26.  Since that time his incision has opened up just a little bit at the superior aspect of the incision site.  The opening is about 3 mm.  But it is 3 cm deep and much larger on the inside.  He has been packing it with gauze twice a day.  It seems to be stalling out and not improving.  The skin is extremely sensitive.  He has a little bit of discoloration at the incision site which is normal.  He also has some skin breakdown from the tape.  He is otherwise stable and doing well.  He does not have diabetes and he is not a smoker.     Review of Systems  Constitutional: Negative.   HENT: Negative.    Eyes: Negative.   Respiratory: Negative.    Cardiovascular: Negative.   Gastrointestinal: Negative.   Endocrine: Negative.   Genitourinary: Negative.   Musculoskeletal:  Positive for back pain.  Skin:  Positive for color change and wound.    Past Medical History:  Diagnosis Date   Arthritis    Atrioventricular block, Mobitz type 1, Wenckebach    Bradycardia    Coronary artery disease    ETT-Myoview EF 51%, inf infarct, no ischemia, low risk   GERD (gastroesophageal reflux disease)    History of echocardiogram    Echo 3/17: EF 55-60%, inferior HK, grade 1 diastolic dysfunctione   History of kidney stones    Hypertension    Macular degeneration    Myocardial infarction (HCC) 2000   with stent   PONV (postoperative nausea and vomiting)    Sleep apnea    cpap in use    Stage 3a chronic kidney disease (CKD) (HCC) 12/02/2021    Past Surgical History:  Procedure Laterality Date   CORONARY ANGIOPLASTY     EYE SURGERY     bilateral cataract  surgery with lens implant   KNEE SURGERY Right    LEFT HEART CATHETERIZATION WITH CORONARY ANGIOGRAM N/A 07/01/2011   Procedure: LEFT HEART CATHETERIZATION WITH CORONARY ANGIOGRAM;  Surgeon: Donato Schultz, MD;  Location: [facility] CATH LAB;  Service: Cardiovascular;  Laterality: N/A;   left wrist surgery     left wrist fracture   LUMBAR LAMINECTOMY/DECOMPRESSION MICRODISCECTOMY Left 01/11/2023   Procedure: Laminectomy and Foraminotomy - Lumbar Three-Four/Lumbar Four-Five, Left Microdiscectomy Lumbar Three-Four;  Surgeon: Arman Bogus, MD;  Location: [facility] OR;  Service: Neurosurgery;  Laterality: Left;   PARTIAL KNEE ARTHROPLASTY Left 08/02/2017   Procedure: LEFT UNICOMPARTMENTAL KNEE;  Surgeon: Durene Romans, MD;  Location: [facility] ORS;  Service: Orthopedics;  Laterality: Left;   PARTIAL KNEE ARTHROPLASTY Right 03/02/2019   Procedure: UNICOMPARTMENTAL KNEE Medially;  Surgeon: Durene Romans, MD;  Location: [facility] ORS;  Service: Orthopedics;  Laterality: Right;  90 mins      Current Outpatient Medications:    amLODipine (NORVASC) 10 MG tablet, Take 10 mg by mouth at bedtime., Disp: , Rfl:    aspirin 81 MG chewable tablet, aspirin 81 mg chewable tablet  Chew 1 tablet every day by oral route., Disp: , Rfl:    atorvastatin (LIPITOR) 80  MG tablet, Take 80 mg by mouth at bedtime., Disp: , Rfl:    beta carotene w/minerals (OCUVITE) tablet, Take 1 tablet by mouth in the morning., Disp: , Rfl:    doxazosin (CARDURA) 8 MG tablet, Take 8 mg by mouth in the morning., Disp: , Rfl:    lisinopril (ZESTRIL) 20 MG tablet, Take 1 tablet (20 mg total) by mouth daily. (Patient taking differently: Take 20 mg by mouth at bedtime.), Disp: 90 tablet, Rfl: 1   omeprazole (PRILOSEC) 40 MG capsule, Take 40 mg by mouth daily before breakfast., Disp: , Rfl:    acetaminophen (TYLENOL) 500 MG tablet, Take 500-1,000 mg by mouth every 6 (six) hours as needed (pain.). (Patient not taking: Reported on 03/12/2023), Disp: , Rfl:    amoxicillin  (AMOXIL) 500 MG capsule, Take 2,000 mg by mouth as directed. Take 4 capsules (2000 mg) by mouth 1 hour prior to dental procedure. (Patient not taking: Reported on 03/12/2023), Disp: , Rfl:    HYDROcodone-acetaminophen (NORCO) 7.5-325 MG tablet, Take 1 tablet by mouth every 4 (four) hours as needed for moderate pain., Disp: 30 tablet, Rfl: 0   Multiple Vitamin (MULTIVITAMIN WITH MINERALS) TABS tablet, Take 1 tablet by mouth in the morning. One A Day Multivitamin (Patient not taking: Reported on 03/12/2023), Disp: , Rfl:   Current Facility-Administered Medications:    methocarbamol (ROBAXIN) tablet 500 mg, 500 mg, Oral, TID, Meyran, Tiana Loft, NP   Objective:   Vitals:   03/12/23 1247  BP: (!) 105/59  Pulse: 68  SpO2: 97%    Physical Exam Vitals reviewed.  Constitutional:      Appearance: Normal appearance.  HENT:     Head: Atraumatic.  Eyes:     Pupils: Pupils are equal, round, and reactive to light.  Cardiovascular:     Rate and Rhythm: Normal rate.     Pulses: Normal pulses.  Musculoskeletal:        General: Tenderness present. No swelling or deformity.       Back:  Skin:    General: Skin is warm.     Capillary Refill: Capillary refill takes less than 2 seconds.     Coloration: Skin is not jaundiced.     Findings: Bruising and erythema present.  Neurological:     Mental Status: He is alert and oriented to person, place, and time.  Psychiatric:        Mood and Affect: Mood normal.        Behavior: Behavior normal.        Thought Content: Thought content normal.        Judgment: Judgment normal.     Assessment & Plan:  S/P lumbar laminectomy  Wound of back, unspecified laterality, initial encounter  Recommend the patient decrease his carbs and sugars and increase his protein to help stimulate healing.  He can shower daily.  Continue packing but switch to new gauze with Vashe.  I did like him to do that for we can get the area cleaned really nicely.  Then I  would like to open the wound about just a centimeter so that we can get it properly packed and put some myriad and there to help with healing.  The patient is in agreement.  Certainly it could lead to needing surgical management but were going to try and avoid that if we can get this to heal with conservative treatment.  Pictures were obtained of the patient and placed in the chart with the patient's or guardian's  permission.   Alena Bills Gae Bihl, DO

## 2023-03-15 ENCOUNTER — Telehealth: Payer: Self-pay

## 2023-03-15 DIAGNOSIS — Z9889 Other specified postprocedural states: Secondary | ICD-10-CM | POA: Diagnosis not present

## 2023-03-15 NOTE — Telephone Encounter (Signed)
Faxed supply order to [facility] 10/25 and received fax confirmation.  Ordered:  4x4 mepilex border Idoform 1/4 of an inch Vashe wound cleanser

## 2023-03-16 DIAGNOSIS — H903 Sensorineural hearing loss, bilateral: Secondary | ICD-10-CM | POA: Diagnosis not present

## 2023-03-19 ENCOUNTER — Ambulatory Visit: Payer: Medicare HMO | Admitting: Plastic Surgery

## 2023-03-19 DIAGNOSIS — S21209A Unspecified open wound of unspecified back wall of thorax without penetration into thoracic cavity, initial encounter: Secondary | ICD-10-CM | POA: Diagnosis not present

## 2023-03-19 NOTE — Progress Notes (Signed)
Procedure Note  Preoperative Dx: back wound  Postoperative Dx: Same  Procedure: excision of back wound 2 cm skin and soft tissue with placement of donated Myriad  Anesthesia: Lidocaine 1% with 1:100,000 epinephrine  Indication for Procedure: back wound  Description of Procedure: Risks and complications were explained to the patient.  Consent was confirmed and the patient understands the risks and benefits.  The potential complications and alternatives were explained and the patient consents.  The patient expressed understanding the option of not having the procedure and the risks of a scar.  Time out was called and all information was confirmed to be correct.    The area was prepped and drapped.  Lidocaine 1% with epinephrine was injected in the subcutaneous area.  After waiting several minutes for the local to take affect a #15 blade was used to incise the skin to obtain a larger opening to 2 cm.  The tissue scissors and pickups were used to sharply excise the hypergranulation tissue at the base.  The myriad powder and sheet was applied and the adaptic placed over it. A dressing was applied.  The patient was given instructions on how to care for the area and a follow up appointment.  Bruce [name] tolerated the procedure well and there were no complications.

## 2023-03-22 ENCOUNTER — Telehealth: Payer: Self-pay | Admitting: *Deleted

## 2023-03-22 DIAGNOSIS — Z9889 Other specified postprocedural states: Secondary | ICD-10-CM | POA: Diagnosis not present

## 2023-03-22 NOTE — Telephone Encounter (Signed)
Called and spoke with the patient on (03/19/23), and informed him that Dr. Ulice Bold wanted me to call [facility] and ask them to send him Adaptic for his wound.  Patient verbalized understanding and agreed.//AB/CMA

## 2023-03-22 NOTE — Telephone Encounter (Addendum)
Called [facility] and spoke with Leilani, and asked if they could add Adaptic to the patient's previous order.    Was informed that yes they can add the Adaptic to his previous order, and it was added.//AB/CMA

## 2023-04-01 ENCOUNTER — Encounter (HOSPITAL_BASED_OUTPATIENT_CLINIC_OR_DEPARTMENT_OTHER): Payer: Medicare HMO | Admitting: General Surgery

## 2023-04-01 DIAGNOSIS — Z0000 Encounter for general adult medical examination without abnormal findings: Secondary | ICD-10-CM | POA: Diagnosis not present

## 2023-04-01 DIAGNOSIS — N529 Male erectile dysfunction, unspecified: Secondary | ICD-10-CM | POA: Diagnosis not present

## 2023-04-01 DIAGNOSIS — I700 Atherosclerosis of aorta: Secondary | ICD-10-CM | POA: Diagnosis not present

## 2023-04-01 DIAGNOSIS — I25119 Atherosclerotic heart disease of native coronary artery with unspecified angina pectoris: Secondary | ICD-10-CM | POA: Diagnosis not present

## 2023-04-01 DIAGNOSIS — K219 Gastro-esophageal reflux disease without esophagitis: Secondary | ICD-10-CM | POA: Diagnosis not present

## 2023-04-01 DIAGNOSIS — E7800 Pure hypercholesterolemia, unspecified: Secondary | ICD-10-CM | POA: Diagnosis not present

## 2023-04-01 DIAGNOSIS — I77810 Thoracic aortic ectasia: Secondary | ICD-10-CM | POA: Diagnosis not present

## 2023-04-01 DIAGNOSIS — J301 Allergic rhinitis due to pollen: Secondary | ICD-10-CM | POA: Diagnosis not present

## 2023-04-01 DIAGNOSIS — I10 Essential (primary) hypertension: Secondary | ICD-10-CM | POA: Diagnosis not present

## 2023-04-01 DIAGNOSIS — N1831 Chronic kidney disease, stage 3a: Secondary | ICD-10-CM | POA: Diagnosis not present

## 2023-04-01 DIAGNOSIS — Z23 Encounter for immunization: Secondary | ICD-10-CM | POA: Diagnosis not present

## 2023-04-01 DIAGNOSIS — Z125 Encounter for screening for malignant neoplasm of prostate: Secondary | ICD-10-CM | POA: Diagnosis not present

## 2023-04-01 DIAGNOSIS — R7303 Prediabetes: Secondary | ICD-10-CM | POA: Diagnosis not present

## 2023-04-01 DIAGNOSIS — N400 Enlarged prostate without lower urinary tract symptoms: Secondary | ICD-10-CM | POA: Diagnosis not present

## 2023-04-01 NOTE — Progress Notes (Signed)
Patient is a pleasant 87 year old male with a history of low back wound subsequent to L3-L5 spine surgery 12/2022 now s/p excision of back wound with placement of donated myriad performed in office 03/19/2023 Dr. Ulice Bold who returns to clinic for postprocedural follow-up.  Reviewed procedure note and myriad followed by Adaptic was placed over the wound with care instructions provided and follow-up scheduled.  Today, doing well.  He is accompanied by his wife, daughter, and great grandson at bedside.  The daughter is the one assisting with dressing changes.  They have been placing Adaptic over the excision site followed by K-Y jelly and then a bordered Mepilex dressing that they received from [facility].  They do tell me that a lot of the sheep gut powder that was placed at the time of his wound excision fell out during one of the dressing changes.  On exam, wound measures 1.75 x 0.35 cm at level of skin, probes down to approximately 2 cm depth with Q-tip.  No considerable residual myriad at the base of the wound and instead there is some healthy granulation.  Surrounding skin appears healthy, no erythema or tape dermatitis.  No malodor or drainage.  No swelling.  Discussed case with Dr. Ulice Bold.  Added additional myriad powder into the base of the wound and covered with Adaptic and K-Y jelly.  This was then secured with bordered Mepilex dressing.  Plan is for him to leave it in place for the next 48 hours before resuming daily Adaptic and K-Y jelly dressing changes.  Follow-up in 1 week.  Picture(s) obtained of the patient and placed in the chart were with the patient's or guardian's permission.

## 2023-04-02 ENCOUNTER — Ambulatory Visit: Payer: Medicare HMO | Admitting: Physician Assistant

## 2023-04-02 ENCOUNTER — Encounter: Payer: Self-pay | Admitting: Physician Assistant

## 2023-04-02 VITALS — BP 145/74 | HR 82 | Ht 65.5 in | Wt 179.0 lb

## 2023-04-02 DIAGNOSIS — S21209A Unspecified open wound of unspecified back wall of thorax without penetration into thoracic cavity, initial encounter: Secondary | ICD-10-CM

## 2023-04-02 DIAGNOSIS — N1831 Chronic kidney disease, stage 3a: Secondary | ICD-10-CM | POA: Diagnosis not present

## 2023-04-02 DIAGNOSIS — R7303 Prediabetes: Secondary | ICD-10-CM | POA: Diagnosis not present

## 2023-04-02 DIAGNOSIS — I10 Essential (primary) hypertension: Secondary | ICD-10-CM | POA: Diagnosis not present

## 2023-04-05 ENCOUNTER — Telehealth: Payer: Self-pay | Admitting: Plastic Surgery

## 2023-04-05 NOTE — Telephone Encounter (Signed)
Spoke with Misty Stanley, continue with dressing changes until appointment on Friday.

## 2023-04-05 NOTE — Telephone Encounter (Signed)
Patients daughter called stating that she tried to change the bandage and all the sheet powders came out. She was unable to fix stop it from coming completely out. She would like to know what to do, if you could please reach out to Zada Girt @ [phone].

## 2023-04-07 DIAGNOSIS — H903 Sensorineural hearing loss, bilateral: Secondary | ICD-10-CM | POA: Diagnosis not present

## 2023-04-08 NOTE — Progress Notes (Signed)
Referring Provider Soundra Pilon, FNP [phone] [address] [place],  [place] [address]   CC:  Chief Complaint  Patient presents with   Follow-up      Bruce [name] is an 87 y.o. male.  HPI: Patient is a pleasant 87 year old male with a history of low back wound subsequent to L3-L5 spine surgery 12/2022 now s/p excision of back wound with placement of donated myriad performed in office 03/19/2023 Dr. Ulice Bold who returns to clinic for postprocedural follow-up.   He was last seen here in clinic on 04/02/2023.  At that time, they reported that some of the myriad placed previously had fallen out during the dressing changes.  Wound measured 1.75 x 0.35 cm at level of the skin, probes down to approximately 2 cm depth with Q-tip.  No residual myriad noted.  Relatively healthy granulation at base.  Surrounding skin appeared healthy, no erythema or tape dermatitis.  Discussed case with Dr. Ulice Bold and applied additional myriad followed by Adaptic and K-Y jelly.  This was secured with bordered Mepilex dressing.  Recommended that he leave in place for 48 hours before resuming daily Adaptic and K-Y jelly dressing changes.  Follow-up in 1 week.  Today, patient is accompanied by family at bedside.  The daughter who has been performing the dressing changes reports that 3 to 4 days after last visit all of the myriad that was placed came off with the bandage at time of dressing change.  Informed patient that a good amount had likely integrated during that timeframe.  They have otherwise been doing well.  Changing dressings regularly.  Endorses some drainage.  Denies any new or worsening pain, swelling, redness, or fevers.  Allergies  Allergen Reactions   Nsaids Other (See Comments)    CKD   Prednisone Other (See Comments)    Lowers heart rate   Methyldopa Itching and Rash   Plavix [Clopidogrel Bisulfate] Itching and Rash   Spironolactone Rash   Zithromax [Azithromycin] Palpitations     Outpatient Encounter Medications as of 04/09/2023  Medication Sig Note   acetaminophen (TYLENOL) 500 MG tablet Take 500-1,000 mg by mouth every 6 (six) hours as needed (pain.).    amLODipine (NORVASC) 10 MG tablet Take 10 mg by mouth at bedtime.    amoxicillin (AMOXIL) 500 MG capsule Take 2,000 mg by mouth as directed. Take 4 capsules (2000 mg) by mouth 1 hour prior to dental procedure.    aspirin 81 MG chewable tablet aspirin 81 mg chewable tablet  Chew 1 tablet every day by oral route. 01/04/2023: On hold per patient due to upcoming procedure.   atorvastatin (LIPITOR) 80 MG tablet Take 80 mg by mouth at bedtime.    beta carotene w/minerals (OCUVITE) tablet Take 1 tablet by mouth in the morning.    doxazosin (CARDURA) 8 MG tablet Take 8 mg by mouth in the morning.    HYDROcodone-acetaminophen (NORCO) 7.5-325 MG tablet Take 1 tablet by mouth every 4 (four) hours as needed for moderate pain.    lisinopril (ZESTRIL) 20 MG tablet Take 1 tablet (20 mg total) by mouth daily. (Patient taking differently: Take 20 mg by mouth at bedtime.)    Multiple Vitamin (MULTIVITAMIN WITH MINERALS) TABS tablet Take 1 tablet by mouth in the morning. One A Day Multivitamin    omeprazole (PRILOSEC) 40 MG capsule Take 40 mg by mouth daily before breakfast.    Facility-Administered Encounter Medications as of 04/09/2023  Medication   methocarbamol (ROBAXIN) tablet 500  mg     Past Medical History:  Diagnosis Date   Arthritis    Atrioventricular block, Mobitz type 1, Wenckebach    Bradycardia    Coronary artery disease    ETT-Myoview EF 51%, inf infarct, no ischemia, low risk   GERD (gastroesophageal reflux disease)    History of echocardiogram    Echo 3/17: EF 55-60%, inferior HK, grade 1 diastolic dysfunctione   History of kidney stones    Hypertension    Macular degeneration    Myocardial infarction (HCC) 2000   with stent   PONV (postoperative nausea and vomiting)    Sleep apnea    cpap in use     Stage 3a chronic kidney disease (CKD) (HCC) 12/02/2021    Past Surgical History:  Procedure Laterality Date   CORONARY ANGIOPLASTY     EYE SURGERY     bilateral cataract surgery with lens implant   KNEE SURGERY Right    LEFT HEART CATHETERIZATION WITH CORONARY ANGIOGRAM N/A 07/01/2011   Procedure: LEFT HEART CATHETERIZATION WITH CORONARY ANGIOGRAM;  Surgeon: Donato Schultz, MD;  Location[facility] CATH LAB;  Service: Cardiovascular;  Laterality: N/A;   left wrist surgery     left wrist fracture   LUMBAR LAMINECTOMY/DECOMPRESSION MICRODISCECTOMY Left 01/11/2023   Procedure: Laminectomy and Foraminotomy - Lumbar Three-Four/Lumbar Four-Five, Left Microdiscectomy Lumbar Three-Four;  Surgeon: Arman Bogus, MD;  Location: [facility] OR;  Service: Neurosurgery;  Laterality: Left;   PARTIAL KNEE ARTHROPLASTY Left 08/02/2017   Procedure: LEFT UNICOMPARTMENTAL KNEE;  Surgeon: Durene Romans, MD;  Location: [facility] ORS;  Service: Orthopedics;  Laterality: Left;   PARTIAL KNEE ARTHROPLASTY Right 03/02/2019   Procedure: UNICOMPARTMENTAL KNEE Medially;  Surgeon: Durene Romans, MD;  Location: [facility] ORS;  Service: Orthopedics;  Laterality: Right;  90 mins    Family History  Problem Relation Age of Onset   Coronary artery disease Father    Kidney disease Sister    Hyperlipidemia Sister    Heart disease Brother    Diabetes Brother    Coronary artery disease Brother    Heart attack Sister     Social History   Social History Narrative   Not on file     Review of Systems General: Denies fevers or chills Skin: Endorses mild drainage from wound  Physical Exam    04/09/2023    9:51 AM 04/02/2023   11:22 AM 03/12/2023   12:47 PM  Vitals with BMI  Height  5' 5.5" 5' 1.2"  Weight  179 lbs 179 lbs 10 oz  BMI  29.32 33.73  Systolic 142 145 638  Diastolic 83 74 59  Pulse 85 82 68    General:  No acute distress, nontoxic appearing  Respiratory: No increased work of breathing Neuro: Alert and  oriented Psychiatric: Normal mood and affect  Skin: 7 x 12 mm wound, approximately 1.75 cm depth when probed with Q-tip.  There does appear to be some new granulation within the wound as though it is filling in.  No expressible drainage or purulence.  Surrounding skin and tissue appears healthy.  No erythema or induration.  No crepitus.  Assessment/Plan  Chronic low back wound subsequent to L3 L5 spine surgery 12/2022:  There has been some improvement since last encounter.  Cleaned the wound with Vashe.  There appears to be new granulation.  Discussed case with Dr. Ulice Bold and will place donated Cellerate followed by Adaptic and K-Y jelly and secured with bordered Mepilex dressing.    Recommend that they keep  this dressing in place for the next 4 to 5 days to allow for the collagen to absorb prior to initiating dressing changes with continued Adaptic and K-Y jelly.  Follow-up in 10 days, sooner if needed.  Evelena Leyden PA-C 04/09/2023, 10:17 AM

## 2023-04-09 ENCOUNTER — Ambulatory Visit: Payer: Medicare HMO | Admitting: Physician Assistant

## 2023-04-09 ENCOUNTER — Encounter: Payer: Self-pay | Admitting: Physician Assistant

## 2023-04-09 VITALS — BP 142/83 | HR 85

## 2023-04-09 DIAGNOSIS — S21209D Unspecified open wound of unspecified back wall of thorax without penetration into thoracic cavity, subsequent encounter: Secondary | ICD-10-CM | POA: Diagnosis not present

## 2023-04-13 ENCOUNTER — Ambulatory Visit: Payer: Medicare HMO | Admitting: Plastic Surgery

## 2023-04-14 ENCOUNTER — Encounter: Payer: Self-pay | Admitting: Physician Assistant

## 2023-04-14 ENCOUNTER — Ambulatory Visit: Payer: Medicare HMO | Attending: Physician Assistant | Admitting: Emergency Medicine

## 2023-04-14 ENCOUNTER — Other Ambulatory Visit: Payer: Self-pay | Admitting: Cardiology

## 2023-04-14 VITALS — BP 117/52 | HR 88 | Ht 65.0 in | Wt 182.2 lb

## 2023-04-14 DIAGNOSIS — N1831 Chronic kidney disease, stage 3a: Secondary | ICD-10-CM

## 2023-04-14 DIAGNOSIS — I2510 Atherosclerotic heart disease of native coronary artery without angina pectoris: Secondary | ICD-10-CM

## 2023-04-14 DIAGNOSIS — G4733 Obstructive sleep apnea (adult) (pediatric): Secondary | ICD-10-CM

## 2023-04-14 DIAGNOSIS — E785 Hyperlipidemia, unspecified: Secondary | ICD-10-CM | POA: Diagnosis not present

## 2023-04-14 DIAGNOSIS — I10 Essential (primary) hypertension: Secondary | ICD-10-CM | POA: Diagnosis not present

## 2023-04-14 NOTE — Progress Notes (Signed)
Cardiology Office Note:    Date:  04/14/2023  ID:  Bruce [name], DOB [birth year], MRN [id number] PCP: Soundra Pilon, FNP  [facility] Providers Cardiologist:  Donato Schultz, MD       Patient Profile:      Bruce [name] is a 87 year old male with past medical history of hypertension, CAD, OSA on CPAP, dyslipidemia, macular degeneration, PUD, aortic atherosclerosis.  In 2002 he is s/p silent MI with RCA stent.  LHC on 07/01/2011 revealed previously placed RCA stent with only 10% in-stent stenosis, LAD with 30% stenosis at large diagonal branch bifurcation.  His last Myoview scan was performed at [facility] in 2019 and it was normal.  ZIO worn October 2019 revealed short runs of atrial tachycardia, no atrial fibrillation and no sustained ventricular tachycardia.  Echocardiogram 05/15/2021 with LVEF 55-60%, no RWMA, grade 1 DD, RV SF normal, no valvular abnormalities.  Was seen by Sharlene Dory, NP on 12/01/2021 where he reported his heart rate would be in the 40s while he was sleeping.  He wore a 14-day ZIO monitor on 01/05/2022 which revealed sinus rhythm with first-degree AV block, rare short bursts of atrial tachycardia, rare PACs/PVCs, brief second-degree heart block type I, bradycardia noted during sleep, no indication for pacemaker.  Last seen with cardiology by Dr. Anne Fu on 03/02/2022, at the time he was doing well.  His main complaint was fatigue that he attributed to his age.  He did have L3-L5 spinal surgery on 12/2022.  Now with chronic low back wound subsequent to his spine surgery.  He does follow with plastics regularly.       History of Present Illness:  Bruce [name] is a 87 y.o. male who returns for 1 year follow-up.  Today, he is doing well from a cardiac standpoint.  He has returned worked in [employer] for over 30 years.  He notes he is doing relatively well over the past year without any cardiac issues or complaints.  He notes he had back surgery on January 12, 2023  that hemorrhoids relieved his lower back and hip/leg pain.  He does note he is having issues with his incision site healing, is seeing plastics on a regular basis for this.  He notes since his back surgery he has not been able to exercise much, he walks 1/4 mile daily and tries to go with his wife to the grocery store to walk around.  He notes when his incision wound on his back heals appropriately he plans to get back to exercising more.  He notes he eats a good and clean diet and tries to eat as much as he can out of his own garden.  He tries to stay away from processed foods.  He does take his blood pressure at home which averages 120s to 130s systolic.  He does wear an Apple Watch daily that can monitor his heart rhythm.  He notes that his heart rate will get into the 40s when he is asleep however when he is awake his heart rate ranges in the 70s to 80s.  He has had no chest pain, shortness of breath, orthopnea, PND, syncope, or near syncope, dizziness, lightheadedness, leg swelling.          Review of Systems  Constitutional: Negative for weight gain and weight loss.  Cardiovascular:  Negative for chest pain, claudication, cyanosis, dyspnea on exertion, irregular heartbeat, leg swelling, near-syncope, orthopnea, palpitations, paroxysmal nocturnal dyspnea and syncope.  Respiratory:  Negative for cough,  hemoptysis and shortness of breath.   Gastrointestinal:  Negative for abdominal pain, hematochezia and melena.  Genitourinary:  Negative for hematuria.  Neurological:  Negative for dizziness and light-headedness.     See HPI    Studies Reviewed:       ZIO 01/05/2022   Sinus rhythm with first degree AV block.   Rare short bursts of atrial tachycardia - benign   Rare PAC/PVC's   Brief second degree HB type 1 (Wenckebach) was present   Bradycardia (40's) noted during sleep - asymptomatic. No indication for pacemaker at this time.  Echocardiogram 05/15/2021 1. Left ventricular ejection  fraction, by estimation, is 55 to 60%. Left  ventricular ejection fraction by 3D volume is 56 %. The left ventricle has  normal function. The left ventricle has no regional wall motion  abnormalities. Left ventricular diastolic   parameters are consistent with Grade I diastolic dysfunction (impaired  relaxation).   2. Right ventricular systolic function is normal. The right ventricular  size is normal. There is normal pulmonary artery systolic pressure. The  estimated right ventricular systolic pressure is 24.0 mmHg.   3. The mitral valve is grossly normal. Trivial mitral valve  regurgitation. No evidence of mitral stenosis.   4. The aortic valve is tricuspid. Aortic valve regurgitation is not  visualized. No aortic stenosis is present.   5. The inferior vena cava is normal in size with greater than 50%  respiratory variability, suggesting right atrial pressure of 3 mmHg.   Vascular US on 07/21/18 Doppler: Right: No significant arterial obstruction detected in the right  upper extremity.  Left: No significant arterial obstruction detected in the left upper extremity.   ZIO 02/25/2018 Patient had a min HR of 27 bpm (3 beats asymptomatic), max HR of 174 bpm, and avg HR of 71 bpm. Predominant underlying rhythm was Sinus Rhythm. First Degree AV Block was present (transient) 2 Ventricular Tachycardia runs occurred, the run with the fastest interval lasting 4 beats with a max rate of 174 bpm, the longest lasting 4 beats with an avg rate of 101 bpm. 8 Supraventricular Tachycardia runs occurred, the run with the fastest interval lasting 6 beats with a max rate of 128 bpm, the longest lasting 5 beats with an avg rate of 99 bpm. Second Degree AV Block-Mobitz I (Wenckebach) was present. Isolated SVEs were rare (<1.0%), SVE Couplets were rare (<1.0%), and SVE Triplets were rare (<1.0%). Isolated VEs were rare (<1.0%, 13340), VE Couplets were rare (<1.0%, 40), and VE Triplets were rare (<1.0%, 1).  Ventricular Bigeminy and Trigeminy were present. No atrial fibrillation, no adverse arrhythmias\  Myoview Stress 08/12/2015 Nuclear stress EF: 51%. There was no ST segment deviation noted during stress. Findings consistent with prior myocardial infarction. This is a low risk study. The left ventricular ejection fraction is mildly decreased (45-54%).  Physical Exam:   VS:  BP (!) 117/52   Pulse 88   Ht 5\' 5"  (1.651 m)   Wt 182 lb 3.2 oz (82.6 kg)   SpO2 95%   BMI 30.32 kg/m    Wt Readings from Last 3 Encounters:  04/14/23 182 lb 3.2 oz (82.6 kg)  04/02/23 179 lb (81.2 kg)  03/12/23 179 lb 9.6 oz (81.5 kg)    Constitutional:      Appearance: Normal and healthy appearance.  Neck:     Vascular: JVD normal.  Pulmonary:     Effort: Pulmonary effort is normal.     Breath sounds: Normal breath sounds.  Chest:  Chest wall: Not tender to palpatation.  Cardiovascular:     PMI at left midclavicular line. Normal rate. Regular rhythm. Normal S1. Normal S2.      Murmurs: There is no murmur.     No gallop.  No click. No rub.  Pulses:    Intact distal pulses.  Edema:    Peripheral edema absent.  Musculoskeletal:     Cervical back: Neck supple. Skin:    General: Skin is warm.  Neurological:     General: No focal deficit present.     Mental Status: Oriented to person, place and time.  Psychiatric:        Behavior: Behavior is cooperative.        Assessment and Plan:  Coronary artery disease / aortic atherosclerosis -s/p stent to RCA in 2002 -Myoview 2019 without ischemia, normal study -CT angio 11/10/2020 with aortic atherosclerosis and mild atherosclerotic calcification -Stable with no anginal symptoms, no indication for ischemic evaluation  -Continue GDMT aspirin 81 mg once daily, atorvastatin 80 mg once daily -Encouraged him to resume 150 minutes of moderate intensity exercise weekly once incision from back surgery is fully healed  Hyperlipidemia, goal <70 -LDL on 63 per  [facility] under goal, under excellent control -Had labs done with PCP office last week, will request so we can scan into chart -Continue atorvastatin 80 mg once daily, no myalgias noted -Encouraged heart healthy dieting.  Increase fiber, vegetables, fruit in diet.  Decrease processed foods and foods high in sugar.  Hypertension -BP today 117/52, well-controlled -Blood pressures at home average 120s to 130s systolic -Continue amlodipine 10 mg once daily, doxazosin 8 mg once daily, lisinopril 20 mg once daily  OSA on CPAP -He remains compliant with CPAP  CKD stage IIIa -Creatinine 1.2 on 02/11/2023 per [facility] -Continues to be stable, avoid NSAIDs                 Dispo:  Return in about 1 year (around 04/13/2024).  Signed, Denyce Robert, NP

## 2023-04-14 NOTE — Patient Instructions (Signed)
Medication Instructions:  Your physician recommends that you continue on your current medications as directed. Please refer to the Current Medication list given to you today.  *If you need a refill on your cardiac medications before your next appointment, please call your pharmacy*   Lab Work: None ordered  If you have labs (blood work) drawn today and your tests are completely normal, you will receive your results only by: MyChart Message (if you have MyChart) OR A paper copy in the mail If you have any lab test that is abnormal or we need to change your treatment, we will call you to review the results.   Testing/Procedures: None ordered   Follow-Up: At [facility], you and your health needs are our priority.  As part of our continuing mission to provide you with exceptional heart care, we have created designated Provider Care Teams.  These Care Teams include your primary Cardiologist (physician) and Advanced Practice Providers (APPs -  Physician Assistants and Nurse Practitioners) who all work together to provide you with the care you need, when you need it.  We recommend signing up for the patient portal called "MyChart".  Sign up information is provided on this After Visit Summary.  MyChart is used to connect with patients for Virtual Visits (Telemedicine).  Patients are able to view lab/test results, encounter notes, upcoming appointments, etc.  Non-urgent messages can be sent to your provider as well.   To learn more about what you can do with MyChart, go to [email or web address].    Your next appointment:   1 year(s)  Provider:   Donato Schultz, MD     Other Instructions

## 2023-04-19 ENCOUNTER — Ambulatory Visit: Payer: Medicare HMO | Admitting: Physician Assistant

## 2023-04-19 ENCOUNTER — Telehealth: Payer: Self-pay | Admitting: *Deleted

## 2023-04-19 VITALS — BP 155/80 | HR 84

## 2023-04-19 DIAGNOSIS — Z9889 Other specified postprocedural states: Secondary | ICD-10-CM | POA: Diagnosis not present

## 2023-04-19 DIAGNOSIS — S21209D Unspecified open wound of unspecified back wall of thorax without penetration into thoracic cavity, subsequent encounter: Secondary | ICD-10-CM

## 2023-04-19 NOTE — Progress Notes (Cosign Needed Addendum)
Referring Provider Soundra Pilon, FNP [phone] [address] [place],  [place] [address]   CC:  Chief Complaint  Patient presents with   Follow-up      Bruce [name] is an 87 y.o. male.  HPI: Patient is a pleasant 87 year old male with a history of low back wound subsequent to L3-L5 spine surgery 12/2022 now s/p excision of back wound with placement of donated myriad performed in office 03/19/2023 Dr. Ulice Bold who returns to clinic for postprocedural follow-up.   He was last seen here in clinic on 04/10/2023.  At that time, wound measured approximately 7 x 12 mm with 1.75 cm depth when probed with Q-tip.  Placed donated cell rate followed by Adaptic and K-Y jelly secured with bordered Mepilex dressing.   Today, patient is doing okay.  He is eager to have his wound healed.  He is accompanied by family at bedside.  His daughter who has been performing his dressing changes tells me that when they removed the bordered Mepilex dressing 5 days after placement of donated Cellerate there was nothing in the wound bed.  Suspect that it had fully absorbed.  He denies any new or worsening pain or other symptoms.  He uses a soft pillow over the area of his wound to help with tenderness.   Allergies  Allergen Reactions   Nsaids Other (See Comments)    CKD   Prednisone Other (See Comments)    Lowers heart rate   Methyldopa Itching and Rash   Plavix [Clopidogrel Bisulfate] Itching and Rash   Spironolactone Rash   Zithromax [Azithromycin] Palpitations    Outpatient Encounter Medications as of 04/19/2023  Medication Sig   acetaminophen (TYLENOL) 500 MG tablet Take 500-1,000 mg by mouth every 6 (six) hours as needed (pain.).   amLODipine (NORVASC) 10 MG tablet Take 10 mg by mouth at bedtime.   amoxicillin (AMOXIL) 500 MG capsule Take 2,000 mg by mouth as directed. Take 4 capsules (2000 mg) by mouth 1 hour prior to dental procedure.   aspirin 81 MG chewable tablet aspirin 81 mg chewable  tablet  Chew 1 tablet every day by oral route.   atorvastatin (LIPITOR) 80 MG tablet Take 80 mg by mouth at bedtime.   beta carotene w/minerals (OCUVITE) tablet Take 1 tablet by mouth in the morning.   doxazosin (CARDURA) 8 MG tablet Take 8 mg by mouth in the morning.   HYDROcodone-acetaminophen (NORCO) 7.5-325 MG tablet Take 1 tablet by mouth every 4 (four) hours as needed for moderate pain.   lisinopril (ZESTRIL) 20 MG tablet TAKE 1 TABLET EVERY DAY   Multiple Vitamin (MULTIVITAMIN WITH MINERALS) TABS tablet Take 1 tablet by mouth in the morning. One A Day Multivitamin   omeprazole (PRILOSEC) 40 MG capsule Take 40 mg by mouth daily before breakfast.   [DISCONTINUED] lisinopril (ZESTRIL) 20 MG tablet Take 1 tablet (20 mg total) by mouth daily.   Facility-Administered Encounter Medications as of 04/19/2023  Medication   methocarbamol (ROBAXIN) tablet 500 mg     Past Medical History:  Diagnosis Date   Arthritis    Atrioventricular block, Mobitz type 1, Wenckebach    Bradycardia    Coronary artery disease    ETT-Myoview EF 51%, inf infarct, no ischemia, low risk   GERD (gastroesophageal reflux disease)    History of echocardiogram    Echo 3/17: EF 55-60%, inferior HK, grade 1 diastolic dysfunctione   History of kidney stones    Hypertension  Macular degeneration    Myocardial infarction (HCC) 2000   with stent   PONV (postoperative nausea and vomiting)    Sleep apnea    cpap in use    Stage 3a chronic kidney disease (CKD) (HCC) 12/02/2021    Past Surgical History:  Procedure Laterality Date   CORONARY ANGIOPLASTY     EYE SURGERY     bilateral cataract surgery with lens implant   KNEE SURGERY Right    LEFT HEART CATHETERIZATION WITH CORONARY ANGIOGRAM N/A 07/01/2011   Procedure: LEFT HEART CATHETERIZATION WITH CORONARY ANGIOGRAM;  Surgeon: Donato Schultz, MD;  Location: [facility] C[facility] LAB;  Service: Cardiovascular;  Laterality: N/A;   left wrist surgery     left wrist fracture    LUMBAR LAMINECTOMY/DECOMPRESSION MICRODISCECTOMY Left 01/11/2023   Procedure: Laminectomy and Foraminotomy - Lumbar Three-Four/Lumbar Four-Five, Left Microdiscectomy Lumbar Three-Four;  Surgeon: Arman Bogus, MD;  Location: [facility] OR;  Service: Neurosurgery;  Laterality: Left;   PARTIAL KNEE ARTHROPLASTY Left 08/02/2017   Procedure: LEFT UNICOMPARTMENTAL KNEE;  Surgeon: Durene Romans, MD;  Location: [facility] ORS;  Service: Orthopedics;  Laterality: Left;   PARTIAL KNEE ARTHROPLASTY Right 03/02/2019   Procedure: UNICOMPARTMENTAL KNEE Medially;  Surgeon: Durene Romans, MD;  Location: [facility] ORS;  Service: Orthopedics;  Laterality: Right;  90 mins    Family History  Problem Relation Age of Onset   Coronary artery disease Father    Kidney disease Sister    Hyperlipidemia Sister    Heart disease Brother    Diabetes Brother    Coronary artery disease Brother    Heart attack Sister     Social History   Social History Narrative   Not on file     Review of Systems General: Denies fevers or chills Skin: Endorses mild drainage on bandages  Physical Exam    04/19/2023    1:15 PM 04/14/2023   10:15 AM 04/09/2023    9:51 AM  Vitals with BMI  Height  5\' 5"    Weight  182 lbs 3 oz   BMI  30.32   Systolic 155 117 696  Diastolic 80 52 83  Pulse 84 88 85    General:  No acute distress, nontoxic appearing  Respiratory: No increased work of breathing Neuro: Alert and oriented Psychiatric: Normal mood and affect  Skin: 16 x 7 mm skin opening, probes to 18 mm depth.  Some granulation noted in the wound bed, but no smooth granular tissue.  Moderate drainage on the wound dressing.  Surrounding skin and tissue appears healthy, no evidence of infection.  No significant malodor.  Assessment/Plan  Chronic low back wound subsequent to L3 L5 spine surgery 12/2022:  No significant improvement since last encounter.  However, it still appears clean and healthy.  Given the moderate amount of drainage, will  transition to silver calcium alginate in the base of the wound followed by bordered Mepilex dressing.  Change every 3 days, sooner if needed due to saturation.  Will adjust prism orders accordingly.  Will also discuss possible wound VAC with Dr. Ulice Bold.  Follow-up in 10 days, sooner if needed.  Picture(s) obtained of the patient and placed in the chart were with the patient's or guardian's permission.'   Evelena Leyden PA-C 04/19/2023, 3:47 PM

## 2023-04-19 NOTE — Telephone Encounter (Signed)
Faxed order,demographics,insurance information,and recent office notes to [facility] for supplies for the patient.  Confirmation received and copy scanned into the chart.//AB/CMA

## 2023-04-20 DIAGNOSIS — Z9889 Other specified postprocedural states: Secondary | ICD-10-CM | POA: Diagnosis not present

## 2023-04-22 ENCOUNTER — Telehealth: Payer: Self-pay | Admitting: Plastic Surgery

## 2023-04-22 DIAGNOSIS — Z6828 Body mass index (BMI) 28.0-28.9, adult: Secondary | ICD-10-CM | POA: Diagnosis not present

## 2023-04-22 DIAGNOSIS — T8130XA Disruption of wound, unspecified, initial encounter: Secondary | ICD-10-CM | POA: Diagnosis not present

## 2023-04-22 NOTE — Telephone Encounter (Signed)
Patient daughter, Misty Stanley, called and says that Patient was supposed to receive bandages but received something else in the package. She wants a call from one of the CMAs to discuss the items that were received.

## 2023-04-27 ENCOUNTER — Encounter: Payer: Medicare HMO | Admitting: Physician Assistant

## 2023-04-30 ENCOUNTER — Ambulatory Visit: Payer: Medicare HMO | Admitting: Physician Assistant

## 2023-04-30 VITALS — BP 134/74 | HR 80 | Ht 65.5 in | Wt 179.0 lb

## 2023-04-30 DIAGNOSIS — S21209D Unspecified open wound of unspecified back wall of thorax without penetration into thoracic cavity, subsequent encounter: Secondary | ICD-10-CM | POA: Diagnosis not present

## 2023-04-30 NOTE — Progress Notes (Signed)
Referring Provider Soundra Pilon, FNP [phone] [address] [place],  [place] [address]   CC:  Chief Complaint  Patient presents with   Skin Problem      Bruce [name] is an 87 y.o. male.  HPI: Patient is a pleasant 87 year old male with a history of low back wound subsequent to L3-L5 spine surgery 12/2022 now s/p excision of back wound with placement of donated myriad performed in office 03/19/2023 Dr. Ulice Bold who returns to clinic for postprocedural follow-up.   He was last seen here in clinic on 04/20/2023.  At that time, he was doing okay.  Eager to have the wound healed.  Skin opening measured 16 x 7 mm.  Probed 18 mm depth.  Some granulation, but no smooth granular tissue.  Moderate drainage on the wound dressing.  Transition to silver calcium alginate in the base of the wound followed by bordered Mepilex dressing.  Change every 3 days, sooner if needed for saturation.  Today, he is doing okay.  He is companied by family at bedside.  Daughter states that she feels as though the skin opening is gotten a bit larger, but that the sides do appear to be coming together in the deep wound.  He is simply eager to have this wound healed and put the dressing changes behind him.  He is interested in anything that might expedite that process.  They are changing dressings every 3 days, as instructed.  Calcium alginate followed by the bordered Mepilex dressing.   Allergies  Allergen Reactions   Nsaids Other (See Comments)    CKD   Prednisone Other (See Comments)    Lowers heart rate   Methyldopa Itching and Rash   Plavix [Clopidogrel Bisulfate] Itching and Rash   Spironolactone Rash   Zithromax [Azithromycin] Palpitations    Outpatient Encounter Medications as of 04/30/2023  Medication Sig   acetaminophen (TYLENOL) 500 MG tablet Take 500-1,000 mg by mouth every 6 (six) hours as needed (pain.).   amLODipine (NORVASC) 10 MG tablet Take 10 mg by mouth at bedtime.   amoxicillin  (AMOXIL) 500 MG capsule Take 2,000 mg by mouth as directed. Take 4 capsules (2000 mg) by mouth 1 hour prior to dental procedure.   aspirin 81 MG chewable tablet aspirin 81 mg chewable tablet  Chew 1 tablet every day by oral route.   atorvastatin (LIPITOR) 80 MG tablet Take 80 mg by mouth at bedtime.   beta carotene w/minerals (OCUVITE) tablet Take 1 tablet by mouth in the morning.   doxazosin (CARDURA) 8 MG tablet Take 8 mg by mouth in the morning.   HYDROcodone-acetaminophen (NORCO) 7.5-325 MG tablet Take 1 tablet by mouth every 4 (four) hours as needed for moderate pain.   lisinopril (ZESTRIL) 20 MG tablet TAKE 1 TABLET EVERY DAY   Multiple Vitamin (MULTIVITAMIN WITH MINERALS) TABS tablet Take 1 tablet by mouth in the morning. One A Day Multivitamin   omeprazole (PRILOSEC) 40 MG capsule Take 40 mg by mouth daily before breakfast.   Facility-Administered Encounter Medications as of 04/30/2023  Medication   methocarbamol (ROBAXIN) tablet 500 mg     Past Medical History:  Diagnosis Date   Arthritis    Atrioventricular block, Mobitz type 1, Wenckebach    Bradycardia    Coronary artery disease    ETT-Myoview EF 51%, inf infarct, no ischemia, low risk   GERD (gastroesophageal reflux disease)    History of echocardiogram    Echo 3/17: EF 55-60%, inferior  HK, grade 1 diastolic dysfunctione   History of kidney stones    Hypertension    Macular degeneration    Myocardial infarction (HCC) 2000   with stent   PONV (postoperative nausea and vomiting)    Sleep apnea    cpap in use    Stage 3a chronic kidney disease (CKD) (HCC) 12/02/2021    Past Surgical History:  Procedure Laterality Date   CORONARY ANGIOPLASTY     EYE SURGERY     bilateral cataract surgery with lens implant   KNEE SURGERY Right    LEFT HEART CATHETERIZATION WITH CORONARY ANGIOGRAM N/A 07/01/2011   Procedure: LEFT HEART CATHETERIZATION WITH CORONARY ANGIOGRAM;  Surgeon: Donato Schultz, MD;  Location: [facility] CATH LAB;   Service: Cardiovascular;  Laterality: N/A;   left wrist surgery     left wrist fracture   LUMBAR LAMINECTOMY/DECOMPRESSION MICRODISCECTOMY Left 01/11/2023   Procedure: Laminectomy and Foraminotomy - Lumbar Three-Four/Lumbar Four-Five, Left Microdiscectomy Lumbar Three-Four;  Surgeon: Arman Bogus, MD;  Location: [facility] OR;  Service: Neurosurgery;  Laterality: Left;   PARTIAL KNEE ARTHROPLASTY Left 08/02/2017   Procedure: LEFT UNICOMPARTMENTAL KNEE;  Surgeon: Durene Romans, MD;  Location: [facility] ORS;  Service: Orthopedics;  Laterality: Left;   PARTIAL KNEE ARTHROPLASTY Right 03/02/2019   Procedure: UNICOMPARTMENTAL KNEE Medially;  Surgeon: Durene Romans, MD;  Location: [facility] ORS;  Service: Orthopedics;  Laterality: Right;  90 mins    Family History  Problem Relation Age of Onset   Coronary artery disease Father    Kidney disease Sister    Hyperlipidemia Sister    Heart disease Brother    Diabetes Brother    Coronary artery disease Brother    Heart attack Sister     Social History   Social History Narrative   Not on file     Review of Systems General: Denies fevers or chills Skin: Endorses continued wound drainage  Physical Exam    04/19/2023    1:15 PM 04/14/2023   10:15 AM 04/09/2023    9:51 AM  Vitals with BMI  Height  5\' 5"    Weight  182 lbs 3 oz   BMI  30.32   Systolic 155 117 086  Diastolic 80 52 83  Pulse 84 88 85    General:  No acute distress, nontoxic appearing  Respiratory: No increased work of breathing Neuro: Alert and oriented Psychiatric: Normal mood and affect  Skin: The skin opening may be slightly larger, approximately 1 x 1.5 cm.  However, there does appear to be improved granulation and can no longer probe deep beyond approximately 10 mm.  Surrounding skin and tissue appears healthy, no induration or obvious infection.   Assessment/Plan  Chronic low back wound subsequent to L3-L5 spine surgery 12/2022:  There does appear to be some improvement since  last encounter in terms of filling in of the deep space.  The calcium alginate has helped with the considerable drainage and the surrounding skin/tissue does not appear macerated.  Recommending continued dressing changes.  Placed donated myriad morsels at today's encounter followed by calcium alginate and bordered Mepilex dressing.  His insurance does not cover KCI/32M wound VAC, but evidently the Medela system is an option. Will call and inquire about obtaining NPWT for patient as that may potentially expedite wound healing.  Discussed case with Dr. Ulice Bold who is agreeable with plan.  Picture(s) obtained of the patient and placed in the chart were with the patient's or guardian's permission.   Evelena Leyden PA-C 04/30/2023, 8:15  AM

## 2023-05-03 ENCOUNTER — Telehealth: Payer: Self-pay | Admitting: *Deleted

## 2023-05-03 NOTE — Telephone Encounter (Signed)
Received from [facility] on (05/03/23) via of fax a request for documents in order to qualify the patient for a wound vac.  Faxed completed Negative Pressure Wound Therapy Order Form,and recent office notes. Confirmation received and copy scanned into the chart.//AB/CMA

## 2023-05-13 ENCOUNTER — Emergency Department (HOSPITAL_COMMUNITY): Payer: Medicare HMO

## 2023-05-13 ENCOUNTER — Telehealth: Payer: Self-pay | Admitting: Physician Assistant

## 2023-05-13 ENCOUNTER — Emergency Department (HOSPITAL_COMMUNITY)
Admission: EM | Admit: 2023-05-13 | Discharge: 2023-05-13 | Disposition: A | Discharge status: Home / Self Care | Payer: Medicare HMO | Attending: Emergency Medicine | Admitting: Emergency Medicine

## 2023-05-13 ENCOUNTER — Other Ambulatory Visit: Payer: Self-pay

## 2023-05-13 DIAGNOSIS — Z79899 Other long term (current) drug therapy: Secondary | ICD-10-CM | POA: Insufficient documentation

## 2023-05-13 DIAGNOSIS — R42 Dizziness and giddiness: Secondary | ICD-10-CM | POA: Insufficient documentation

## 2023-05-13 DIAGNOSIS — N189 Chronic kidney disease, unspecified: Secondary | ICD-10-CM | POA: Diagnosis not present

## 2023-05-13 DIAGNOSIS — I2510 Atherosclerotic heart disease of native coronary artery without angina pectoris: Secondary | ICD-10-CM | POA: Diagnosis not present

## 2023-05-13 DIAGNOSIS — I10 Essential (primary) hypertension: Secondary | ICD-10-CM | POA: Diagnosis not present

## 2023-05-13 DIAGNOSIS — I129 Hypertensive chronic kidney disease with stage 1 through stage 4 chronic kidney disease, or unspecified chronic kidney disease: Secondary | ICD-10-CM | POA: Diagnosis not present

## 2023-05-13 DIAGNOSIS — R079 Chest pain, unspecified: Secondary | ICD-10-CM | POA: Diagnosis not present

## 2023-05-13 DIAGNOSIS — Z7982 Long term (current) use of aspirin: Secondary | ICD-10-CM | POA: Insufficient documentation

## 2023-05-13 DIAGNOSIS — R0989 Other specified symptoms and signs involving the circulatory and respiratory systems: Secondary | ICD-10-CM | POA: Diagnosis not present

## 2023-05-13 DIAGNOSIS — R0789 Other chest pain: Secondary | ICD-10-CM | POA: Diagnosis not present

## 2023-05-13 LAB — BASIC METABOLIC PANEL
Anion gap: 11 (ref 5–15)
BUN: 15 mg/dL (ref 8–23)
CO2: 22 mmol/L (ref 22–32)
Calcium: 8.4 mg/dL — ABNORMAL LOW (ref 8.9–10.3)
Chloride: 103 mmol/L (ref 98–111)
Creatinine, Ser: 1.24 mg/dL (ref 0.61–1.24)
GFR, Estimated: 57 mL/min — ABNORMAL LOW (ref >60)
Glucose, Bld: 124 mg/dL — ABNORMAL HIGH (ref 70–99)
Potassium: 4.2 mmol/L (ref 3.5–5.1)
Sodium: 136 mmol/L (ref 135–145)

## 2023-05-13 LAB — CBC
HCT: 38.9 % — ABNORMAL LOW (ref 39.0–52.0)
Hemoglobin: 12.6 g/dL — ABNORMAL LOW (ref 13.0–17.0)
MCH: 31.3 pg (ref 26.0–34.0)
MCHC: 32.4 g/dL (ref 30.0–36.0)
MCV: 96.5 fL (ref 80.0–100.0)
Platelets: 151 10*3/uL (ref 150–400)
RBC: 4.03 MIL/uL — ABNORMAL LOW (ref 4.22–5.81)
RDW: 13.7 % (ref 11.5–15.5)
WBC: 7.7 10*3/uL (ref 4.0–10.5)
nRBC: 0 % (ref 0.0–0.2)

## 2023-05-13 LAB — TROPONIN I (HIGH SENSITIVITY)
Troponin I (High Sensitivity): 9 ng/L (ref <18)
Troponin I (High Sensitivity): 9 ng/L (ref <18)

## 2023-05-13 MED ORDER — ACETAMINOPHEN 500 MG PO TABS
1000.0000 mg | ORAL_TABLET | Freq: Once | ORAL | Status: AC
  Administered 2023-05-13: 1000 mg via ORAL
  Filled 2023-05-13: qty 2

## 2023-05-13 MED ORDER — ISOSORBIDE MONONITRATE ER 30 MG PO TB24: 15.0000 mg | ORAL_TABLET | Freq: Every day | ORAL | 0 refills | Status: DC

## 2023-05-13 MED ORDER — ALUM & MAG HYDROXIDE-SIMETH 200-200-20 MG/5ML PO SUSP
15.0000 mL | Freq: Once | ORAL | Status: AC
  Administered 2023-05-13: 15 mL via ORAL
  Filled 2023-05-13: qty 30

## 2023-05-13 MED ORDER — ISOSORBIDE MONONITRATE ER 30 MG PO TB24
15.0000 mg | ORAL_TABLET | Freq: Every day | ORAL | Status: DC
  Administered 2023-05-13: 15 mg via ORAL
  Filled 2023-05-13: qty 1

## 2023-05-13 NOTE — Telephone Encounter (Signed)
Daughter called stating that they are currently at the ER, Bruce [name] is having chest pains and she said that she will call us in the morning to let us know if he will be coming in. If not we will she reschedule him.

## 2023-05-13 NOTE — Discharge Instructions (Addendum)
It was a pleasure caring for you today.  Please call your cardiologist for Singh in the morning for a quick follow-up appointment.  I have sent your new medication to your pharmacy.  Please take half a tablet once daily (15mg ) until you are able to see your cardiology. Seek emergency care if experiencing any new or worsening symptoms.

## 2023-05-13 NOTE — ED Triage Notes (Signed)
Pt to the ed from home with a CC of chest pressure/ generalized weakness x 4 days. Pt relays cardiac hx with stent placement in 2022. Pt denies sob, loc. Pt got 325 and 1 nitroglycerin by ems.

## 2023-05-13 NOTE — ED Provider Notes (Signed)
[facility] EMERGENCY DEPARTMENT AT [facility] Provider Note   CSN: [id number] Arrival date & time: 05/13/23  1520     History  Chief Complaint  Patient presents with   Chest Pain    Bruce [name] is a 87 y.o. male with PMHx CKD, CAD/MI, type 1 AV block, GERD, HTN who presents to ED concerned for chest pain since 1PM today. Symptoms started while patient was walking [place] [employer]. NTG given by EMS helped. Patient felt a little ill on Saturday which resolved and then returned today with dizziness and chest pain. Also with pain with inspiration which has since resolved after NTG. Patient on daily omeprazole and is not concerned for GERD causing his symptoms.  Denies fever, dyspnea, cough, nausea, vomiting, diarrhea, abdominal pain, dysuria, hematuria, hematochezia. Denies recent surgery/immobilization, hx DT/PE, hemoptysis, hx cancer in the past 6 months, calf swelling/tenderness.    Chest Pain      Home Medications Prior to Admission medications   Medication Sig Start Date End Date Taking? Authorizing Provider  isosorbide mononitrate (IMDUR) 30 MG 24 hr tablet Take 0.5 tablets (15 mg total) by mouth daily. 05/13/23  Yes Valrie Hart F, PA-C  acetaminophen (TYLENOL) 500 MG tablet Take 500-1,000 mg by mouth every 6 (six) hours as needed (pain.).    [provider]  amLODipine (NORVASC) 10 MG tablet Take 10 mg by mouth at bedtime. 04/24/20   [provider]  amoxicillin (AMOXIL) 500 MG capsule Take 2,000 mg by mouth as directed. Take 4 capsules (2000 mg) by mouth 1 hour prior to dental procedure.    [provider]  aspirin 81 MG chewable tablet aspirin 81 mg chewable tablet  Chew 1 tablet every day by oral route.    [provider]  atorvastatin (LIPITOR) 80 MG tablet Take 80 mg by mouth at bedtime.    [provider]  beta carotene w/minerals (OCUVITE) tablet Take 1 tablet by mouth in the morning.    [provider]  doxazosin (CARDURA) 8 MG tablet Take 8 mg by mouth in the morning.    [provider]  HYDROcodone-acetaminophen (NORCO) 7.5-325 MG tablet Take 1 tablet by mouth every 4 (four) hours as needed for moderate pain. 01/12/23   Meyran, Tiana Loft, NP  lisinopril (ZESTRIL) 20 MG tablet TAKE 1 TABLET EVERY DAY 04/14/23   Jake Bathe, MD  Multiple Vitamin (MULTIVITAMIN WITH MINERALS) TABS tablet Take 1 tablet by mouth in the morning. One A Day Multivitamin    [provider]  omeprazole (PRILOSEC) 40 MG capsule Take 40 mg by mouth daily before breakfast.    [provider]      Allergies    Nsaids, Prednisone, Methyldopa, Plavix [clopidogrel bisulfate], Spironolactone, and Zithromax [azithromycin]    Review of Systems   Review of Systems  Cardiovascular:  Positive for chest pain.    Physical Exam Updated Vital Signs BP (!) 140/66 (BP Location: Right Arm)   Pulse (!) 55   Temp 98 F (36.7 C) (Oral)   Resp 17   Ht 5\' 5"  (1.651 m)   Wt 81 kg   SpO2 98%   BMI 29.72 kg/m  Physical Exam Vitals and nursing note reviewed.  Constitutional:      General: He is not in acute distress.    Appearance: He is not ill-appearing, toxic-appearing or diaphoretic.  HENT:     Head: Normocephalic and atraumatic.     Mouth/Throat:     Mouth:  Mucous membranes are moist.     Pharynx: No oropharyngeal exudate or posterior oropharyngeal erythema.  Eyes:     General: No scleral icterus.       Right eye: No discharge.        Left eye: No discharge.     Conjunctiva/sclera: Conjunctivae normal.  Cardiovascular:     Rate and Rhythm: Normal rate and regular rhythm.     Pulses: Normal pulses.     Heart sounds: Normal heart sounds. No murmur heard. Pulmonary:     Effort: Pulmonary effort is normal. No respiratory distress.     Breath sounds: Normal breath sounds. No wheezing, rhonchi or rales.  Abdominal:     Tenderness: There is no abdominal tenderness.   Musculoskeletal:     Right lower leg: No edema.     Left lower leg: No edema.     Comments: Calf tenderness to palpation  Skin:    General: Skin is warm and dry.     Findings: No rash.  Neurological:     General: No focal deficit present.     Mental Status: He is alert. Mental status is at baseline.  Psychiatric:        Mood and Affect: Mood normal.        Behavior: Behavior normal.     ED Results / Procedures / Treatments   Labs (all labs ordered are listed, but only abnormal results are displayed) Labs Reviewed  BASIC METABOLIC PANEL - Abnormal; Notable for the following components:      Result Value   Glucose, Bld 124 (*)    Calcium 8.4 (*)    GFR, Estimated 57 (*)    All other components within normal limits  CBC - Abnormal; Notable for the following components:   RBC 4.03 (*)    Hemoglobin 12.6 (*)    HCT 38.9 (*)    All other components within normal limits  TROPONIN I (HIGH SENSITIVITY)  TROPONIN I (HIGH SENSITIVITY)    EKG EKG Interpretation Date/Time:  Thursday May 13 2023 15:32:42 EST Ventricular Rate:  77 PR Interval:  181 QRS Duration:  109 QT Interval:  412 QTC Calculation: 467 R Axis:   15  Text Interpretation: Sinus rhythm Inferoposterior infarct, age indeterminate Confirmed by Anders Simmonds [phone]) on 05/13/2023 6:34:28 PM  Radiology DG Chest 2 View Result Date: 05/13/2023 CLINICAL DATA:  Chest pain. EXAM: CHEST - 2 VIEW COMPARISON:  Chest radiograph dated November 10, 2020. FINDINGS: Low lung volumes with accentuation of the cardiomediastinal silhouette. No focal consolidation, sizeable pleural effusion, or pneumothorax. No acute osseous abnormality. IMPRESSION: Low lung volumes.  No acute cardiopulmonary findings. Electronically Signed   By: Hart Robinsons M.D.   On: 05/13/2023 16:56    Procedures Procedures    Medications Ordered in ED Medications  isosorbide mononitrate (IMDUR) 24 hr tablet 15 mg (15 mg Oral Given 05/13/23 2040)   alum & mag hydroxide-simeth (MAALOX/MYLANTA) 200-200-20 MG/5ML suspension 15 mL (15 mLs Oral Given 05/13/23 1844)  acetaminophen (TYLENOL) tablet 1,000 mg (1,000 mg Oral Given 05/13/23 1844)    ED Course/ Medical Decision Making/ A&P                                 Medical Decision Making Amount and/or Complexity of Data Reviewed Labs: ordered. Radiology: ordered.   This patient presents to the ED for concern of chest pain, this involves an extensive number of treatment  options, and is a complaint that carries with it a high risk of complications and morbidity.  The differential diagnosis includes acute coronary syndrome, congestive heart failure, pericarditis, pneumonia, pulmonary embolism, tension pneumothorax, esophageal rupture, aortic dissection, cardiac tamponade, musculoskeletal   Co morbidities that complicate the patient evaluation  CKD, CAD/MI, type 1 AV block, GERD, HTN    Additional history obtained:  Additional history obtained from Cardiology notes: Stent to RCA 2002 Myoview 2019 without ischemia CTA 10/2020 aortic atherosclerosis 2022 ECHO showing 55-60% EF   Lab Tests:  I Ordered, and personally interpreted labs.  The pertinent results include:  - Troponin: initial and repeat within normal limits - BMP: no concern for electrolyte abnormality; no concern for kidney damage - CBC: no leukocytosis, mild anemia   Imaging Studies ordered:  I ordered imaging studies including  -chest xray: to assess for process contributing to patient's symptoms  I independently visualized and interpreted imaging  I agree with the radiologist interpretation   Cardiac Monitoring: / EKG:  The patient was maintained on a cardiac monitor.  I personally viewed and interpreted the cardiac monitored which showed an underlying rhythm of: sinus rhythm without acute ST changes   Risk Stratification Score:  - Wells Score: 0   Problem List / ED Course / Critical interventions /  Medication management  Patient presented for chest pain since 1PM today while walking [place] [employer] which resolved with NTG given by EMS. Patient also endorsing intermittent chest pain while sitting in ED bed. Physical exam unremarkable. Patient afebrile with stable vitals. CBC without leukocytosis.  There is mild anemia with hemoglobin at 12.6.  BMP reassuring.  Initial and repeat troponin within normal limits.  Chest x-ray without acute cardiopulmonary disease.  EKG reassuring. Consulted with Cardiology who recommended starting patient on 15mg  Isosorbide Mononitrate and close follow up with outpatient cardiology. Shared information with patient who verbalized understanding of plan. patient tolerated first dose well in ED. I have reviewed the patients home medicines and have made adjustments as needed Patient was given return precautions. Patient stable for discharge at this time.  Patient verbalized understanding of plan.  Ddx:  These are considered less likely due to history of present illness and physical exam findings.  -Acute coronary syndrome: EKG and troponins within normal limits  -Congestive heart failure: patient denies orthopnea, cough, and leg edema -Pneumonia: lungs are clear to auscultation bilaterally -Pulmonary embolism: no recent surgeries, blood clot hx, hemoptysis, cancer hx, vitals stable. Wells PE score 0. -Pneumothorax: lungs are clear to auscultation bilaterally -Esophageal rupture: patient denies vomiting, heavy drinking, and hx of GERD -Aortic dissection: vital signs are stable, no variation in pulse pressure -Cardiac tamponade: absence of hypotension, JVD, and muffled heart sounds   Social Determinants of Health:  none          Final Clinical Impression(s) / ED Diagnoses Final diagnoses:  Chest pain, unspecified type    Rx / DC Orders ED Discharge Orders          Ordered    isosorbide mononitrate (IMDUR) 30 MG 24 hr tablet  Daily        05/13/23  2040              Dorthy Cooler, [place] 05/13/23 2046    Anders Simmonds T, DO 05/14/23 2323

## 2023-05-14 ENCOUNTER — Telehealth: Payer: Self-pay

## 2023-05-14 ENCOUNTER — Ambulatory Visit: Payer: Medicare HMO | Admitting: Physician Assistant

## 2023-05-14 VITALS — BP 131/75 | HR 64

## 2023-05-14 DIAGNOSIS — S21209D Unspecified open wound of unspecified back wall of thorax without penetration into thoracic cavity, subsequent encounter: Secondary | ICD-10-CM | POA: Diagnosis not present

## 2023-05-14 NOTE — Telephone Encounter (Signed)
I just read the ER discharge note. We can certainly reschedule for next week if he is not feeling well. Thanks.

## 2023-05-14 NOTE — Telephone Encounter (Signed)
Faxed OV note from today to Stormy Card., [employer] [phone]), with updated wound information including nutritional guidelines per their request.   Confirmation of receipt received.

## 2023-05-14 NOTE — Progress Notes (Signed)
Referring Provider Soundra Pilon, FNP [phone] [address] [place],  [place] [address]   CC:  Chief Complaint  Patient presents with   Follow-up      Bruce [name] is an 87 y.o. male.  HPI: Patient is a pleasant 87 year old male with a history of low back wound subsequent to L3-L5 spine surgery 12/2022 now s/p excision of back wound with placement of donated myriad performed in office 03/19/2023 Dr. Ulice Bold who returns to clinic for postprocedural follow-up.   He was last seen here in clinic on 04/30/2023.  At that time, he was doing well and accompanied by family at bedside.  Felt as though the sides of the wound do appear to be come together, but that the skin opening may have even gotten larger.  He is interested [place] that may expedite the process.  Wound measured 1.5 x 1 cm, approximately 10 mm depth.  Recommended continued calcium alginate dressings followed by bordered Mepilex.  Then place an order for Medela NPWT system for expanding wound healing.  Today, patient is companied by family at bedside.  Daughter who performs dressing changes regularly feels as though there has been some improvement.  However, they would still appreciate NPWT wound VAC system and they have yet to hear from [employer] despite orders placed exactly 2 weeks ago today.  They have continued with the calcium alginate followed by bordered Mepilex dressings.  Patient reports a normal, healthy diet.  Good mix of animal proteins and vegetables.  No dietary restrictions.  Nutritional status is not in question.  On another note, he feels fatigued beyond his baseline even with minimal exertion.  Feels comparable to previous cardiac blockage for which he has had multiple stents placed.  He was seen yesterday in the ED for these symptoms in addition to near syncope/chest pain that resolved with NTG sublingual.  Initially his outpatient follow-up with his cardiologist was end of February, but he was able to  move it up to early January.   Allergies  Allergen Reactions   Nsaids Other (See Comments)    CKD   Prednisone Other (See Comments)    Lowers heart rate   Methyldopa Itching and Rash   Plavix [Clopidogrel Bisulfate] Itching and Rash   Spironolactone Rash   Zithromax [Azithromycin] Palpitations    Outpatient Encounter Medications as of 05/14/2023  Medication Sig   acetaminophen (TYLENOL) 500 MG tablet Take 500-1,000 mg by mouth every 6 (six) hours as needed (pain.).   amLODipine (NORVASC) 10 MG tablet Take 10 mg by mouth at bedtime.   amoxicillin (AMOXIL) 500 MG capsule Take 2,000 mg by mouth as directed. Take 4 capsules (2000 mg) by mouth 1 hour prior to dental procedure.   aspirin 81 MG chewable tablet aspirin 81 mg chewable tablet  Chew 1 tablet every day by oral route.   atorvastatin (LIPITOR) 80 MG tablet Take 80 mg by mouth at bedtime.   beta carotene w/minerals (OCUVITE) tablet Take 1 tablet by mouth in the morning.   doxazosin (CARDURA) 8 MG tablet Take 8 mg by mouth in the morning.   HYDROcodone-acetaminophen (NORCO) 7.5-325 MG tablet Take 1 tablet by mouth every 4 (four) hours as needed for moderate pain.   isosorbide mononitrate (IMDUR) 30 MG 24 hr tablet Take 0.5 tablets (15 mg total) by mouth daily.   lisinopril (ZESTRIL) 20 MG tablet TAKE 1 TABLET EVERY DAY   Multiple Vitamin (MULTIVITAMIN WITH MINERALS) TABS tablet Take 1 tablet by  mouth in the morning. One A Day Multivitamin   omeprazole (PRILOSEC) 40 MG capsule Take 40 mg by mouth daily before breakfast.   Facility-Administered Encounter Medications as of 05/14/2023  Medication   methocarbamol (ROBAXIN) tablet 500 mg     Past Medical History:  Diagnosis Date   Arthritis    Atrioventricular block, Mobitz type 1, Wenckebach    Bradycardia    Coronary artery disease    ETT-Myoview EF 51%, inf infarct, no ischemia, low risk   GERD (gastroesophageal reflux disease)    History of echocardiogram    Echo 3/17: EF  55-60%, inferior HK, grade 1 diastolic dysfunctione   History of kidney stones    Hypertension    Macular degeneration    Myocardial infarction (HCC) 2000   with stent   PONV (postoperative nausea and vomiting)    Sleep apnea    cpap in use    Stage 3a chronic kidney disease (CKD) (HCC) 12/02/2021    Past Surgical History:  Procedure Laterality Date   CORONARY ANGIOPLASTY     EYE SURGERY     bilateral cataract surgery with lens implant   KNEE SURGERY Right    LEFT HEART CATHETERIZATION WITH CORONARY ANGIOGRAM N/A 07/01/2011   Procedure: LEFT HEART CATHETERIZATION WITH CORONARY ANGIOGRAM;  Surgeon: Donato Schultz, MD;  Location: [facility] CATH LAB;  Service: Cardiovascular;  Laterality: N/A;   left wrist surgery     left wrist fracture   LUMBAR LAMINECTOMY/DECOMPRESSION MICRODISCECTOMY Left 01/11/2023   Procedure: Laminectomy and Foraminotomy - Lumbar Three-Four/Lumbar Four-Five, Left Microdiscectomy Lumbar Three-Four;  Surgeon: Arman Bogus, MD;  Location: [facility] OR;  Service: Neurosurgery;  Laterality: Left;   PARTIAL KNEE ARTHROPLASTY Left 08/02/2017   Procedure: LEFT UNICOMPARTMENTAL KNEE;  Surgeon: Durene Romans, MD;  Location: [facility] ORS;  Service: Orthopedics;  Laterality: Left;   PARTIAL KNEE ARTHROPLASTY Right 03/02/2019   Procedure: UNICOMPARTMENTAL KNEE Medially;  Surgeon: Durene Romans, MD;  Location: [facility] ORS;  Service: Orthopedics;  Laterality: Right;  90 mins    Family History  Problem Relation Age of Onset   Coronary artery disease Father    Kidney disease Sister    Hyperlipidemia Sister    Heart disease Brother    Diabetes Brother    Coronary artery disease Brother    Heart attack Sister     Social History   Social History Narrative   Not on file     Review of Systems General: Denies fevers or chills Skin: Endorses minimal drainage, but improvement in wound healing  Physical Exam    05/14/2023    1:08 PM 05/13/2023    9:00 PM 05/13/2023    8:30 PM  Vitals with  BMI  Systolic 131 132 841  Diastolic 75 74 66  Pulse 64 57 56    General:  No acute distress, nontoxic appearing  Respiratory: No increased work of breathing Neuro: Alert and oriented Psychiatric: Normal mood and affect  Skin: Skin opening approximately 8 x 14 mm.  Depth approximately 12 mm.  Has filled in nicely, with the exception of a pinpoint hole at the superior aspect of wound from which there is a clearish drainage with palpation.  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  However, there remains a pinpoint wound from which there is drainage.  Will continue with calcium alginate followed by bordered Mepilex dressing, but if there is  a possibility that we can get this patient a wound VAC suspect it would be his best option for wound healing.  No evidence concerning for infection on exam.  Will again touch base with [employer] (third-party DME supplier for Medela wound VAC system) as they are his only option based on insurance.  Plan for patient to call us next week to provide update on wound VAC status as that will determine next appointment.  Evelena Leyden PA-C 05/14/2023, 1:39 PM

## 2023-05-17 ENCOUNTER — Ambulatory Visit: Payer: Medicare HMO | Admitting: Physician Assistant

## 2023-05-17 VITALS — BP 128/82 | HR 88

## 2023-05-17 DIAGNOSIS — S21209D Unspecified open wound of unspecified back wall of thorax without penetration into thoracic cavity, subsequent encounter: Secondary | ICD-10-CM | POA: Diagnosis not present

## 2023-05-17 NOTE — Progress Notes (Signed)
Referring Provider Soundra Pilon, FNP [phone] [address] [place],  [place] [address]   CC:  Chief Complaint  Patient presents with   Follow-up      Bruce [name] is an 87 y.o. male.  HPI: Patient is a pleasant 87 year old male with a history of low back wound subsequent to L3-L5 spine surgery 12/2022 now s/p excision of back wound with placement of donated myriad performed in office 03/19/2023 Dr. Ulice Bold who returns to clinic for postprocedural follow-up.   He was last seen here in clinic on 05/14/2023.  At that time, there had been some small improvement in the wound, but still felt as though wound VAC would be most appropriate management.  Continued calcium alginate dressing changes until wound VAC can be secured.  Today, patient is accompanied by family.  He received his wound VAC and is ready for placement today.  He has an appoint with cardiology and is still hoping for cardiac catheterization in the next few weeks.  If the wound VAC needs to be temporarily discontinued at that time, that is fine.   Allergies  Allergen Reactions   Nsaids Other (See Comments)    CKD   Prednisone Other (See Comments)    Lowers heart rate   Methyldopa Itching and Rash   Plavix [Clopidogrel Bisulfate] Itching and Rash   Spironolactone Rash   Zithromax [Azithromycin] Palpitations    Outpatient Encounter Medications as of 05/17/2023  Medication Sig   acetaminophen (TYLENOL) 500 MG tablet Take 500-1,000 mg by mouth every 6 (six) hours as needed (pain.).   amLODipine (NORVASC) 10 MG tablet Take 10 mg by mouth at bedtime.   amoxicillin (AMOXIL) 500 MG capsule Take 2,000 mg by mouth as directed. Take 4 capsules (2000 mg) by mouth 1 hour prior to dental procedure.   aspirin 81 MG chewable tablet aspirin 81 mg chewable tablet  Chew 1 tablet every day by oral route.   atorvastatin (LIPITOR) 80 MG tablet Take 80 mg by mouth at bedtime.   beta carotene w/minerals (OCUVITE) tablet Take 1  tablet by mouth in the morning.   doxazosin (CARDURA) 8 MG tablet Take 8 mg by mouth in the morning.   HYDROcodone-acetaminophen (NORCO) 7.5-325 MG tablet Take 1 tablet by mouth every 4 (four) hours as needed for moderate pain.   isosorbide mononitrate (IMDUR) 30 MG 24 hr tablet Take 0.5 tablets (15 mg total) by mouth daily.   lisinopril (ZESTRIL) 20 MG tablet TAKE 1 TABLET EVERY DAY   Multiple Vitamin (MULTIVITAMIN WITH MINERALS) TABS tablet Take 1 tablet by mouth in the morning. One A Day Multivitamin   omeprazole (PRILOSEC) 40 MG capsule Take 40 mg by mouth daily before breakfast.   Facility-Administered Encounter Medications as of 05/17/2023  Medication   methocarbamol (ROBAXIN) tablet 500 mg     Past Medical History:  Diagnosis Date   Arthritis    Atrioventricular block, Mobitz type 1, Wenckebach    Bradycardia    Coronary artery disease    ETT-Myoview EF 51%, inf infarct, no ischemia, low risk   GERD (gastroesophageal reflux disease)    History of echocardiogram    Echo 3/17: EF 55-60%, inferior HK, grade 1 diastolic dysfunctione   History of kidney stones    Hypertension    Macular degeneration    Myocardial infarction (HCC) 2000   with stent   PONV (postoperative nausea and vomiting)    Sleep apnea    cpap in use  Stage 3a chronic kidney disease (CKD) (HCC) 12/02/2021    Past Surgical History:  Procedure Laterality Date   CORONARY ANGIOPLASTY     EYE SURGERY     bilateral cataract surgery with lens implant   KNEE SURGERY Right    LEFT HEART CATHETERIZATION WITH CORONARY ANGIOGRAM N/A 07/01/2011   Procedure: LEFT HEART CATHETERIZATION WITH CORONARY ANGIOGRAM;  Surgeon: Donato Schultz, MD;  Location: [facility] CATH LAB;  Service: Cardiovascular;  Laterality: N/A;   left wrist surgery     left wrist fracture   LUMBAR LAMINECTOMY/DECOMPRESSION MICRODISCECTOMY Left 01/11/2023   Procedure: Laminectomy and Foraminotomy - Lumbar Three-Four/Lumbar Four-Five, Left Microdiscectomy  Lumbar Three-Four;  Surgeon: Arman Bogus, MD;  Location: [facility] OR;  Service: Neurosurgery;  Laterality: Left;   PARTIAL KNEE ARTHROPLASTY Left 08/02/2017   Procedure: LEFT UNICOMPARTMENTAL KNEE;  Surgeon: Durene Romans, MD;  Location: [facility] ORS;  Service: Orthopedics;  Laterality: Left;   PARTIAL KNEE ARTHROPLASTY Right 03/02/2019   Procedure: UNICOMPARTMENTAL KNEE Medially;  Surgeon: Durene Romans, MD;  Location: [facility] ORS;  Service: Orthopedics;  Laterality: Right;  90 mins    Family History  Problem Relation Age of Onset   Coronary artery disease Father    Kidney disease Sister    Hyperlipidemia Sister    Heart disease Brother    Diabetes Brother    Coronary artery disease Brother    Heart attack Sister     Social History   Social History Narrative   Not on file     Review of Systems General: Denies fevers or chills Skin: Mild drainage  Physical Exam    05/17/2023    2:40 PM 05/14/2023    1:08 PM 05/13/2023    9:00 PM  Vitals with BMI  Systolic 128 131 161  Diastolic 82 75 74  Pulse 88 64 57    General:  No acute distress, nontoxic appearing  Respiratory: No increased work of breathing Neuro: Alert and oriented Psychiatric: Normal mood and affect  Skin: Unchanged since last encounter.  See images.  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  Excellent seal noted.  Provided instructions to the patient and family.  Follow-up in 1 week for wound VAC change.  He understands to call the office should he have questions or concerns in the interim.   Evelena Leyden PA-C 05/17/2023, 3:31 PM

## 2023-05-17 NOTE — Progress Notes (Signed)
 Cardiology Office Note    [name] Name: Bruce [name] Date of Encounter: 05/18/2023  Primary Care Provider:  Marvene Prentice [name], FNP Primary Cardiologist:  Bruce Parchment, MD Primary Electrophysiologist: None  mm Past Medical History    Past Medical History:  Diagnosis Date   Arthritis    Atrioventricular block, Mobitz type 1, Wenckebach    Bradycardia    Coronary artery disease    ETT-Myoview  EF 51%, inf infarct, no ischemia, low risk   GERD (gastroesophageal reflux disease)    History of echocardiogram    Echo 3/17: EF 55-60%, inferior HK, grade 1 diastolic dysfunctione   History of kidney stones    Hypertension    Macular degeneration    Myocardial infarction (HCC) 2000   with stent   PONV (postoperative nausea and vomiting)    Sleep apnea    cpap in use    Stage 3a chronic kidney disease (CKD) (HCC) 12/02/2021    History of Present Illness  Bruce [name] is a 87 y.o. male with a PMH of CAD s/p MI PCI/DES to RCA mild obstructive disease elsewhere, HTN, HLD, diastolic dysfunction, OSA (on CPAP), CKD stage IIIa renal cell carcinoma who presents today for ED follow-up of chest pain.  Bruce [name] has a significant coronary history with MI and PCI to RCA in 2002 moderate LAD stenosis.  Left heart cath chest pain in 2013 that showed patent stents.  A stress test that showed no ischemia but small inferior infarct pattern.  He had an additional clear stress test completed 10/2017 that showed no ischemia.  [name]'s last 2D echo was completed 04/2021 with EF of 55 to 60% and no RWMA.  He was last seen on 04/14/2023 by Bruce Louis, NP doing well from a cardiac perspective.  He was noted to have stable blood pressures and no changes were made to current regimen.  He was seen in [facility] ED on 05/13/2023 with complaint of chest pain.  He reported pain occurred while walking around his room by EMS that helped.  He also noted pain on inspiration that resolved with nitroglycerin .  [name]'s troponins  were negative and EKG showed no ACS.  He was consulted by cardiology who recommended Imdur  15 mg and outpatient follow-up.  Bruce [name], with a history of coronary disease, presents with ongoing chest discomfort despite being prescribed isosorbide  (Imdur ) following a recent emergency room visit for chest pain. Bruce [name]'s spouse reports that Bruce [name] had another episode of chest pain while in Bruce emergency room, but no further diagnostic testing was done at that time. Bruce [name]'s chest discomfort has not completely resolved with Bruce isosorbide . Bruce [name] also reports feeling tired and washed out, with a lack of energy for daily activities. This fatigue has been ongoing for a couple of months and has been particularly bothersome since Bruce Saturday before Christmas, when Bruce [name] felt like he was going to pass out. Bruce [name] has a history of silent heart attacks, which adds to Bruce concern about his current symptoms.  He denies shortness of breath, lower extremity edema, palpitations, melena, hematuria, hemoptysis, diaphoresis, weakness, presyncope, syncope, orthopnea, and PND.   Review of Systems  Please see Bruce history of present illness.    All other systems reviewed and are otherwise negative except as noted above.  Physical Exam    Wt Readings from Last 3 Encounters:  05/18/23 186 lb (84.4 kg)  05/13/23 178 lb 9.2 oz (81 kg)  04/30/23 179 lb (81.2 kg)  VS: Vitals:   05/18/23 1028 05/18/23 1228  BP: (!) 142/64 118/72  Pulse: 95   Resp: 15   SpO2: 95%   ,Body mass index is 30.95 kg/m. GEN: Well nourished, well developed in no acute distress Neck: No JVD; No carotid bruits Pulmonary: Clear to auscultation without rales, wheezing or rhonchi  Cardiovascular: Normal rate. Regular rhythm. Normal S1. Normal S2.   Murmurs: There is no murmur.  ABDOMEN: Soft, non-tender, non-distended EXTREMITIES:  No edema; No deformity   EKG/LABS/ Recent Cardiac Studies   ECG personally  reviewed by me today -none completed today  Risk Assessment/Calculations:          Lab Results  Component Value Date   WBC 7.7 05/13/2023   HGB 12.6 (L) 05/13/2023   HCT 38.9 (L) 05/13/2023   MCV 96.5 05/13/2023   PLT 151 05/13/2023   Lab Results  Component Value Date   CREATININE 1.24 05/13/2023   BUN 15 05/13/2023   NA 136 05/13/2023   K 4.2 05/13/2023   CL 103 05/13/2023   CO2 22 05/13/2023   Lab Results  Component Value Date   CHOL 113 12/05/2021   HDL 37 (L) 12/05/2021   LDLCALC 63 12/05/2021   TRIG 55 12/05/2021   CHOLHDL 3.1 12/05/2021    No results found for: HGBA1C Assessment & Plan    1.  Coronary artery disease -Recent episode of chest pain on 05/13/2023, resolved with nitroglycerin . Negative troponins and EKG in [facility] emergency room. Currently on Isosorbide  15mg  with some residual symptoms. History of silent heart attacks and previous coronary blockages in 2002 and 2013. - Increase Isosorbide  to 30mg  as needed for breakthrough chest pain. - Schedule a nuclear stress test to assess myocardial perfusion. - Schedule an echocardiogram to assess cardiac structure and function.  2.  Chest pain: -[name] reports improvement in chest discomfort episodes. -Continue Imdur  15 mg daily with additional as needed 15 mg -Will complete nuclear stress test as noted above  3.  Essential hypertension -Blood pressure slightly elevated at 142/64, but improved to 118/72 during Bruce visit. - Continue current antihypertensive regimen. - Monitor blood pressure at home.  4.  HLD: -[name]'s last LDL cholesterol was 73 -Continue Lipitor  80 mg daily  5.  Obstructive sleep apnea: -[name] reports compliance nightly      Disposition: Follow-up with Bruce Parchment, MD or APP in 3 months Informed Consent   Shared Decision Making/Informed Consent Bruce risks [chest pain, shortness of breath, cardiac arrhythmias, dizziness, blood pressure fluctuations, myocardial infarction,  stroke/transient ischemic attack, nausea, vomiting, allergic reaction, radiation exposure, metallic taste sensation and life-threatening complications (estimated to be 1 in 10,000)], benefits (risk stratification, diagnosing coronary artery disease, treatment guidance) and alternatives of a nuclear stress test were discussed in detail with Bruce [name] and he agrees to proceed.      Signed, Wyn Raddle, Jackee Shove, NP 05/18/2023, 12:29 PM [facility]

## 2023-05-18 ENCOUNTER — Encounter: Payer: Self-pay | Admitting: Nurse Practitioner

## 2023-05-18 ENCOUNTER — Ambulatory Visit: Payer: Medicare HMO | Attending: Nurse Practitioner | Admitting: Nurse Practitioner

## 2023-05-18 VITALS — BP 118/72 | HR 95 | Resp 15 | Ht 65.0 in | Wt 186.0 lb

## 2023-05-18 DIAGNOSIS — I10 Essential (primary) hypertension: Secondary | ICD-10-CM

## 2023-05-18 DIAGNOSIS — I2510 Atherosclerotic heart disease of native coronary artery without angina pectoris: Secondary | ICD-10-CM

## 2023-05-18 DIAGNOSIS — R5383 Other fatigue: Secondary | ICD-10-CM | POA: Diagnosis not present

## 2023-05-18 DIAGNOSIS — E785 Hyperlipidemia, unspecified: Secondary | ICD-10-CM | POA: Diagnosis not present

## 2023-05-18 DIAGNOSIS — R079 Chest pain, unspecified: Secondary | ICD-10-CM

## 2023-05-18 DIAGNOSIS — G4733 Obstructive sleep apnea (adult) (pediatric): Secondary | ICD-10-CM | POA: Diagnosis not present

## 2023-05-18 MED ORDER — ISOSORBIDE MONONITRATE ER 30 MG PO TB24: ORAL_TABLET | ORAL | 3 refills | Status: DC

## 2023-05-18 NOTE — Patient Instructions (Signed)
 Medication Instructions:  Your physician has recommended you make the following change in your medication:   You may take an extra 1/2 tablet of Isosorbide  only as needed for chest pain.  This is in a addition to the 1/2 tablet you are taking daily  *If you need a refill on your cardiac medications before your next appointment, please call your pharmacy*   Lab Work: None ordered  If you have labs (blood work) drawn today and your tests are completely normal, you will receive your results only by: MyChart Message (if you have MyChart) OR A paper copy in the mail If you have any lab test that is abnormal or we need to change your treatment, we will call you to review the results.   Testing/Procedures: Your physician has requested that you have an echocardiogram. Echocardiography is a painless test that uses sound waves to create images of your heart. It provides your doctor with information about the size and shape of your heart and how well your heart's chambers and valves are working. This procedure takes approximately one hour. There are no restrictions for this procedure. Please do NOT wear cologne, perfume, aftershave, or lotions (deodorant is allowed). Please arrive 15 minutes prior to your appointment time.  Please note: We ask at that you not bring children with you during ultrasound (echo/ vascular) testing. Due to room size and safety concerns, children are not allowed in the ultrasound rooms during exams. Our front office staff cannot provide observation of children in our lobby area while testing is being conducted. An adult accompanying a patient to their appointment will only be allowed in the ultrasound room at the discretion of the ultrasound technician under special circumstances. We apologize for any inconvenience.   Your physician has requested that you have a lexiscan  myoview . For further information please visit [email or web address] Please follow instruction sheet,  BELOW:    You are scheduled for a Myocardial Perfusion Imaging Study  Please arrive 15 minutes prior to your appointment time for registration and insurance purposes.  The test will take approximately 3 to 4 hours to complete; you may bring reading material.  If someone comes with you to your appointment, they will need to remain in the main lobby due to limited space in the testing area. **If you are pregnant or breastfeeding, please notify the nuclear lab prior to your appointment**  How to prepare for your Myocardial Perfusion Test: Do not eat or drink 3 hours prior to your test, except you may have water . Do not consume products containing caffeine (regular or decaffeinated) 12 hours prior to your test. (ex: coffee, chocolate, sodas, tea). Do bring a list of your current medications with you.  If not listed below, you may take your medications as normal. Do wear comfortable clothes (no dresses or overalls) and walking shoes, tennis shoes preferred (No heels or open toe shoes are allowed). Do NOT wear cologne, perfume, aftershave, or lotions (deodorant is allowed). If these instructions are not followed, your test will have to be rescheduled.     Follow-Up: At [facility], you and your health needs are our priority.  As part of our continuing mission to provide you with exceptional heart care, we have created designated Provider Care Teams.  These Care Teams include your primary Cardiologist (physician) and Advanced Practice Providers (APPs -  Physician Assistants and Nurse Practitioners) who all work together to provide you with the care you need, when you need it.  We recommend signing up  for the patient portal called MyChart.  Sign up information is provided on this After Visit Summary.  MyChart is used to connect with patients for Virtual Visits (Telemedicine).  Patients are able to view lab/test results, encounter notes, upcoming appointments, etc.  Non-urgent messages can  be sent to your provider as well.   To learn more about what you can do with MyChart, go to [email or web address].    Your next appointment:   AFTER TESTING  Provider:   Jackee Alberts, NP         Other Instructions

## 2023-05-24 ENCOUNTER — Telehealth: Payer: Self-pay | Admitting: Cardiology

## 2023-05-24 NOTE — Telephone Encounter (Signed)
 [facility] called to get clarification on instructions for IMDUR 30 mg. She states the tablet can not be split. Instructions say take 1/2 tablet. Please clarify

## 2023-05-24 NOTE — Telephone Encounter (Signed)
 Pt c/o medication issue:  1. Name of Medication: isosorbide  mononitrate (IMDUR ) 30 MG 24 hr tablet   2. How are you currently taking this medication (dosage and times per day)?   3. Are you having a reaction (difficulty breathing--STAT)?   4. What is your medication issue? Needs med directions

## 2023-05-25 ENCOUNTER — Ambulatory Visit: Payer: Medicare HMO | Admitting: Cardiology

## 2023-05-26 ENCOUNTER — Ambulatory Visit: Payer: Medicare HMO | Admitting: Physician Assistant

## 2023-05-26 DIAGNOSIS — S21209D Unspecified open wound of unspecified back wall of thorax without penetration into thoracic cavity, subsequent encounter: Secondary | ICD-10-CM | POA: Diagnosis not present

## 2023-05-26 NOTE — Progress Notes (Signed)
 Referring Provider Marvene Prentice SAUNDERS, FNP [phone] [place],  [place] [address]   CC:  Chief Complaint  Patient presents with   Follow-up      Bruce [name] is an 88 y.o. male.  HPI: Patient is a pleasant 88 year old male with a history of low back wound subsequent to L3-L5 spine surgery 12/2022 now s/p excision of back wound with placement of donated myriad performed in office 03/19/2023 Dr. Lowery who returns to clinic for postprocedural follow-up.   He was last seen here in clinic on 05/17/2023.  At that time, wound VAC was placed with excellent seal noted at conclusion of visit.  Return in 1 week for VAC change.  Today, patient is doing well.  They state that they had no issues with the wound VAC since placement last week.  He reports that it is mildly cumbersome and he has had to make adjustments with ADLs, but overall he is doing okay and is simply hopeful that it has helped his healing.  He also endorses some mild itchiness at the wound site.   Allergies  Allergen Reactions   Nsaids Other (See Comments)    CKD   Prednisone  Other (See Comments)    Lowers heart rate   Methyldopa Itching and Rash   Plavix [Clopidogrel Bisulfate] Itching and Rash   Spironolactone Rash   Zithromax [Azithromycin] Palpitations    Outpatient Encounter Medications as of 05/26/2023  Medication Sig   acetaminophen  (TYLENOL ) 500 MG tablet Take 500-1,000 mg by mouth every 6 (six) hours as needed (pain.).   amLODipine  (NORVASC ) 10 MG tablet Take 10 mg by mouth at bedtime.   amoxicillin (AMOXIL) 500 MG capsule Take 2,000 mg by mouth as directed. Take 4 capsules (2000 mg) by mouth 1 hour prior to dental procedure.   aspirin  81 MG chewable tablet aspirin  81 mg chewable tablet  Chew 1 tablet every day by oral route.   atorvastatin  (LIPITOR ) 80 MG tablet Take 80 mg by mouth at bedtime.   beta carotene w/minerals (OCUVITE) tablet Take 1 tablet by mouth in the morning.   doxazosin   (CARDURA ) 8 MG tablet Take 8 mg by mouth in the morning.   HYDROcodone -acetaminophen  (NORCO) 7.5-325 MG tablet Take 1 tablet by mouth every 4 (four) hours as needed for moderate pain. (Patient not taking: Reported on 05/18/2023)   isosorbide  mononitrate (IMDUR ) 30 MG 24 hr tablet TAKE 1/2 TABLET BY MOUTH DAILY, MAY TAKE 1/2 TABLET EXTRA DAILY ONLY AS NEEDED FOR CHEST PAIN   lisinopril  (ZESTRIL ) 20 MG tablet TAKE 1 TABLET EVERY DAY   Multiple Vitamin (MULTIVITAMIN WITH MINERALS) TABS tablet Take 1 tablet by mouth in the morning. One A Day Multivitamin   omeprazole  (PRILOSEC) 40 MG capsule Take 40 mg by mouth daily before breakfast.   Facility-Administered Encounter Medications as of 05/26/2023  Medication   methocarbamol  (ROBAXIN ) tablet 500 mg     Past Medical History:  Diagnosis Date   Arthritis    Atrioventricular block, Mobitz type 1, Wenckebach    Bradycardia    Coronary artery disease    ETT-Myoview  EF 51%, inf infarct, no ischemia, low risk   GERD (gastroesophageal reflux disease)    History of echocardiogram    Echo 3/17: EF 55-60%, inferior HK, grade 1 diastolic dysfunctione   History of kidney stones    Hypertension    Macular degeneration    Myocardial infarction (HCC) 2000   with stent   PONV (postoperative nausea and vomiting)  Sleep apnea    cpap in use    Stage 3a chronic kidney disease (CKD) (HCC) 12/02/2021    Past Surgical History:  Procedure Laterality Date   CORONARY ANGIOPLASTY     EYE SURGERY     bilateral cataract surgery with lens implant   KNEE SURGERY Right    LEFT HEART CATHETERIZATION WITH CORONARY ANGIOGRAM N/A 07/01/2011   Procedure: LEFT HEART CATHETERIZATION WITH CORONARY ANGIOGRAM;  Surgeon: Oneil Parchment, MD;  Location: [facility] CATH LAB;  Service: Cardiovascular;  Laterality: N/A;   left wrist surgery     left wrist fracture   LUMBAR LAMINECTOMY/DECOMPRESSION MICRODISCECTOMY Left 01/11/2023   Procedure: Laminectomy and Foraminotomy - Lumbar  Three-Four/Lumbar Four-Five, Left Microdiscectomy Lumbar Three-Four;  Surgeon: Joshua Alm Hamilton, MD;  Location: [facility] OR;  Service: Neurosurgery;  Laterality: Left;   PARTIAL KNEE ARTHROPLASTY Left 08/02/2017   Procedure: LEFT UNICOMPARTMENTAL KNEE;  Surgeon: Ernie Cough, MD;  Location: [facility] ORS;  Service: Orthopedics;  Laterality: Left;   PARTIAL KNEE ARTHROPLASTY Right 03/02/2019   Procedure: UNICOMPARTMENTAL KNEE Medially;  Surgeon: Ernie Cough, MD;  Location: [facility] ORS;  Service: Orthopedics;  Laterality: Right;  90 mins    Family History  Problem Relation Age of Onset   Coronary artery disease Father    Kidney disease Sister    Hyperlipidemia Sister    Heart disease Brother    Diabetes Brother    Coronary artery disease Brother    Heart attack Sister     Social History   Social History Narrative   Not on file     Review of Systems General: Denies fevers or chills Skin: Denies issues with wound VAC  Physical Exam    05/18/2023   12:28 PM 05/18/2023   10:28 AM 05/17/2023    2:40 PM  Vitals with BMI  Height  5' 5   Weight  186 lbs   BMI  30.95   Systolic 118 142 871  Diastolic 72 64 82  Pulse  95 88    General:  No acute distress, nontoxic appearing  Respiratory: No increased work of breathing Neuro: Alert and oriented Psychiatric: Normal mood and affect  Skin: 13 x 4 mm skin opening.  Tracks down approximately 10 to 12 mm.  There is a persistent small hole at the superior aspect of wound bed.  The surrounding skin does appear mildly irritated and erythematous, likely moisture mediated.  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  He has nuclear stress test scheduled in a couple of weeks.  Unfortunately there is no considerable improvement since last encounter with the wound VAC.  Will continue to give it another week or 2 before discontinuation.  They tell me that his neurosurgeon, Dr. Joshua, felt  comfortable that he would be able to fix the defect, however they were hoping that conservative management with us  would be enough to solve the problem.  Given the lack of considerable progress, we will likely have patient follow-up with Dr. Joshua to discuss surgical options if he fails to see improvement in the next couple of weeks.  They are simply hoping that his cardiac issues can be addressed prior to a potential return to the operating room.   Honora Seip PA-C 05/26/2023, 11:53 AM

## 2023-05-30 ENCOUNTER — Encounter: Payer: Self-pay | Admitting: Cardiology

## 2023-05-31 ENCOUNTER — Telehealth: Payer: Self-pay | Admitting: Cardiology

## 2023-05-31 NOTE — Telephone Encounter (Signed)
MyChart message has been read.

## 2023-05-31 NOTE — Telephone Encounter (Signed)
 Patient's daughter sent MyChart message last night and is waiting on response. She wants to know if her father is safe to travel to [place] on Wednesday and come back the same day. Please advise.

## 2023-05-31 NOTE — Telephone Encounter (Signed)
 Jake Bathe, MD to [employer]      05/31/23  1:19 PM So sorry to hear this.  I am comfortable with him going to funeral.    Donato Schultz, MD       Response sent to pt via MyChart

## 2023-06-02 ENCOUNTER — Telehealth (HOSPITAL_COMMUNITY): Payer: Self-pay | Admitting: *Deleted

## 2023-06-02 ENCOUNTER — Ambulatory Visit: Payer: Medicare HMO | Admitting: Physician Assistant

## 2023-06-02 NOTE — Telephone Encounter (Signed)
 Left message on voicemail per DPR in reference to upcoming appointment scheduled on 06/07/2023 at 7:45 with detailed instructions given per Myocardial Perfusion Study Information Sheet for the test. LM to arrive 15 minutes early, and that it is imperative to arrive on time for appointment to keep from having the test rescheduled. If you need to cancel or reschedule your appointment, please call the office within 24 hours of your appointment. Failure to do so may result in a cancellation of your appointment, and a $50 no show fee. Phone number given for call back for any questions.

## 2023-06-03 ENCOUNTER — Encounter: Payer: Self-pay | Admitting: Physician Assistant

## 2023-06-03 ENCOUNTER — Ambulatory Visit (INDEPENDENT_AMBULATORY_CARE_PROVIDER_SITE_OTHER): Payer: Medicare HMO | Admitting: Physician Assistant

## 2023-06-03 VITALS — BP 138/75 | HR 73

## 2023-06-03 DIAGNOSIS — S21209D Unspecified open wound of unspecified back wall of thorax without penetration into thoracic cavity, subsequent encounter: Secondary | ICD-10-CM | POA: Diagnosis not present

## 2023-06-03 NOTE — Progress Notes (Signed)
Referring Provider Soundra Pilon, FNP [phone] [address]. [address] [place],  [place] [address]   CC:  Chief Complaint  Patient presents with   Follow-up      Bruce [name] is an 88 y.o. male.  HPI: Patient is a pleasant 88 year old male with a history of low back wound subsequent to L3-L5 spine surgery 12/2022 now s/p excision of back wound with placement of donated myriad performed in office 03/19/2023 Dr. Ulice Bold who returns to clinic for postprocedural follow-up.   He was last seen here in clinic on 05/26/2023.  At that time, there unfortunately had not been any considerable improvement since previous encounter.  Patient and family told me that his neurosurgeon, Dr. Yetta Barre, felt comfortable that he would be able to fix the defect, but they were simply hoping that he can be managed conservatively given his recent cardiac issues and upcoming nuclear stress test.  Given the lack of considerable improvement, will likely have patient follow-up with Dr. Yetta Barre to discuss surgical options if he fails to have improvement with the wound VAC therapy.  Today, patient is doing okay.  He is eager to have this wound resolved.  Denies any issues or complications from the wound VAC since last encounter.  He has an appointment with Dr. Yetta Barre in the next 1 to 2 weeks.  He is accompanied by family at bedside.   Allergies  Allergen Reactions   Nsaids Other (See Comments)    CKD   Prednisone Other (See Comments)    Lowers heart rate   Methyldopa Itching and Rash   Plavix [Clopidogrel Bisulfate] Itching and Rash   Spironolactone Rash   Zithromax [Azithromycin] Palpitations    Outpatient Encounter Medications as of 06/03/2023  Medication Sig   acetaminophen (TYLENOL) 500 MG tablet Take 500-1,000 mg by mouth every 6 (six) hours as needed (pain.).   amLODipine (NORVASC) 10 MG tablet Take 10 mg by mouth at bedtime.   amoxicillin (AMOXIL) 500 MG capsule Take 2,000 mg by mouth as directed. Take 4 capsules  (2000 mg) by mouth 1 hour prior to dental procedure.   aspirin 81 MG chewable tablet aspirin 81 mg chewable tablet  Chew 1 tablet every day by oral route.   atorvastatin (LIPITOR) 80 MG tablet Take 80 mg by mouth at bedtime.   beta carotene w/minerals (OCUVITE) tablet Take 1 tablet by mouth in the morning.   doxazosin (CARDURA) 8 MG tablet Take 8 mg by mouth in the morning.   HYDROcodone-acetaminophen (NORCO) 7.5-325 MG tablet Take 1 tablet by mouth every 4 (four) hours as needed for moderate pain.   isosorbide mononitrate (IMDUR) 30 MG 24 hr tablet TAKE 1/2 TABLET BY MOUTH DAILY, MAY TAKE 1/2 TABLET EXTRA DAILY ONLY AS NEEDED FOR CHEST PAIN   lisinopril (ZESTRIL) 20 MG tablet TAKE 1 TABLET EVERY DAY   Multiple Vitamin (MULTIVITAMIN WITH MINERALS) TABS tablet Take 1 tablet by mouth in the morning. One A Day Multivitamin   omeprazole (PRILOSEC) 40 MG capsule Take 40 mg by mouth daily before breakfast.   Facility-Administered Encounter Medications as of 06/03/2023  Medication   methocarbamol (ROBAXIN) tablet 500 mg     Past Medical History:  Diagnosis Date   Arthritis    Atrioventricular block, Mobitz type 1, Wenckebach    Bradycardia    Coronary artery disease    ETT-Myoview EF 51%, inf infarct, no ischemia, low risk   GERD (gastroesophageal reflux disease)    History of echocardiogram  Echo 3/17: EF 55-60%, inferior HK, grade 1 diastolic dysfunctione   History of kidney stones    Hypertension    Macular degeneration    Myocardial infarction (HCC) 2000   with stent   PONV (postoperative nausea and vomiting)    Sleep apnea    cpap in use    Stage 3a chronic kidney disease (CKD) (HCC) 12/02/2021    Past Surgical History:  Procedure Laterality Date   CORONARY ANGIOPLASTY     EYE SURGERY     bilateral cataract surgery with lens implant   KNEE SURGERY Right    LEFT HEART CATHETERIZATION WITH CORONARY ANGIOGRAM N/A 07/01/2011   Procedure: LEFT HEART CATHETERIZATION WITH CORONARY  ANGIOGRAM;  Surgeon: Donato Schultz, MD;  Location: [facility] CATH LAB;  Service: Cardiovascular;  Laterality: N/A;   left wrist surgery     left wrist fracture   LUMBAR LAMINECTOMY/DECOMPRESSION MICRODISCECTOMY Left 01/11/2023   Procedure: Laminectomy and Foraminotomy - Lumbar Three-Four/Lumbar Four-Five, Left Microdiscectomy Lumbar Three-Four;  Surgeon: Arman Bogus, MD;  Location: [facility] OR;  Service: Neurosurgery;  Laterality: Left;   PARTIAL KNEE ARTHROPLASTY Left 08/02/2017   Procedure: LEFT UNICOMPARTMENTAL KNEE;  Surgeon: Durene Romans, MD;  Location: [facility] ORS;  Service: Orthopedics;  Laterality: Left;   PARTIAL KNEE ARTHROPLASTY Right 03/02/2019   Procedure: UNICOMPARTMENTAL KNEE Medially;  Surgeon: Durene Romans, MD;  Location: [facility] ORS;  Service: Orthopedics;  Laterality: Right;  90 mins    Family History  Problem Relation Age of Onset   Coronary artery disease Father    Kidney disease Sister    Hyperlipidemia Sister    Heart disease Brother    Diabetes Brother    Coronary artery disease Brother    Heart attack Sister     Social History   Social History Narrative   Not on file     Review of Systems General: Denies fevers or chills Skin: No issues with wound VAC  Physical Exam    06/03/2023    2:06 PM 05/18/2023   12:28 PM 05/18/2023   10:28 AM  Vitals with BMI  Height   5\' 5"   Weight   186 lbs  BMI   30.95  Systolic 138 118 299  Diastolic 75 72 64  Pulse 73  95    General:  No acute distress, nontoxic appearing  Respiratory: No increased work of breathing Neuro: Alert and oriented Psychiatric: Normal mood and affect  Skin: Persistent defect, possible improvement since last encounter, but still can appreciate mild fluid from pinpoint hole when palpating immediately adjacent to the wound.  Surrounding skin and tissue is mildly irritated, likely moisture mediated, but no evidence concerning for infection.  Assessment/Plan  Chronic low back wound subsequent to L3-L5  spine surgery 12/2022:   Replaced wound VAC.  Follow-up in 1 week for reevaluation.  If there is still no significant change or improvement, will likely discontinue the wound VAC given that it is increasingly burdensome.  Nuclear stress test and upcoming appointment with his neurosurgeon upcoming in the next 1 to 2 weeks.  Picture(s) obtained of the patient and placed in the chart were with the patient's or guardian's permission.   Evelena Leyden PA-C 06/03/2023, 2:51 PM

## 2023-06-04 ENCOUNTER — Telehealth: Payer: Self-pay | Admitting: Cardiology

## 2023-06-04 NOTE — Telephone Encounter (Signed)
Pt c/o medication issue:  1. Name of Medication: Imdur  2. How are you currently taking this medication (dosage and times per day)?   3. Are you having a reaction (difficulty breathing--STAT)?   4. What is your medication issue?  Patient is scheduled for a Stress Test on Monday, should he take this medicine(Imdur)

## 2023-06-04 NOTE — Telephone Encounter (Signed)
Spoke with Misty Stanley, daughter (OK per [facility]) and advised OK for pt to take normal medications this day.  She states understanding and was appreciative of the call back and information.

## 2023-06-07 ENCOUNTER — Ambulatory Visit (HOSPITAL_COMMUNITY): Payer: Medicare HMO | Attending: Nurse Practitioner

## 2023-06-07 DIAGNOSIS — R5383 Other fatigue: Secondary | ICD-10-CM | POA: Diagnosis not present

## 2023-06-07 DIAGNOSIS — I10 Essential (primary) hypertension: Secondary | ICD-10-CM | POA: Diagnosis not present

## 2023-06-07 DIAGNOSIS — I2510 Atherosclerotic heart disease of native coronary artery without angina pectoris: Secondary | ICD-10-CM | POA: Diagnosis not present

## 2023-06-07 DIAGNOSIS — G4733 Obstructive sleep apnea (adult) (pediatric): Secondary | ICD-10-CM | POA: Diagnosis not present

## 2023-06-07 DIAGNOSIS — E785 Hyperlipidemia, unspecified: Secondary | ICD-10-CM | POA: Diagnosis not present

## 2023-06-07 DIAGNOSIS — R079 Chest pain, unspecified: Secondary | ICD-10-CM | POA: Diagnosis not present

## 2023-06-07 LAB — MYOCARDIAL PERFUSION IMAGING
LV dias vol: 87 mL (ref 62–150)
LV sys vol: 36 mL
Nuc Stress EF: 58 %
Peak HR: 126 {beats}/min
Rest HR: 85 {beats}/min
Rest Nuclear Isotope Dose: 10.4 mCi
SDS: 0
SRS: 7
SSS: 7
ST Depression (mm): 0 mm
Stress Nuclear Isotope Dose: 32 mCi
TID: 0.96

## 2023-06-07 MED ORDER — REGADENOSON 0.4 MG/5ML IV SOLN
0.4000 mg | Freq: Once | INTRAVENOUS | Status: AC
  Administered 2023-06-07: 0.4 mg via INTRAVENOUS

## 2023-06-07 MED ORDER — TECHNETIUM TC 99M TETROFOSMIN IV KIT
32.0000 | PACK | Freq: Once | INTRAVENOUS | Status: AC | PRN
  Administered 2023-06-07: 32 via INTRAVENOUS

## 2023-06-07 MED ORDER — TECHNETIUM TC 99M TETROFOSMIN IV KIT
10.4000 | PACK | Freq: Once | INTRAVENOUS | Status: AC | PRN
  Administered 2023-06-07: 10.4 via INTRAVENOUS

## 2023-06-08 ENCOUNTER — Encounter (INDEPENDENT_AMBULATORY_CARE_PROVIDER_SITE_OTHER): Payer: Medicare HMO | Admitting: Ophthalmology

## 2023-06-09 ENCOUNTER — Telehealth: Payer: Self-pay

## 2023-06-09 NOTE — Telephone Encounter (Signed)
Faxed OV notes for patient's wound vac to [facility]: [phone] with confirmed receipt.

## 2023-06-10 ENCOUNTER — Encounter: Payer: Self-pay | Admitting: Physician Assistant

## 2023-06-10 ENCOUNTER — Ambulatory Visit: Payer: Medicare HMO | Admitting: Physician Assistant

## 2023-06-10 VITALS — BP 133/76 | HR 95 | Ht 65.5 in | Wt 185.0 lb

## 2023-06-10 DIAGNOSIS — S21209D Unspecified open wound of unspecified back wall of thorax without penetration into thoracic cavity, subsequent encounter: Secondary | ICD-10-CM | POA: Diagnosis not present

## 2023-06-10 NOTE — Progress Notes (Signed)
Referring Provider Soundra Pilon, FNP [phone] [address] [place],  [place] [address]   CC:  Chief Complaint  Patient presents with   Follow-up      Bruce [name] is an 88 y.o. male.  HPI: Patient is a pleasant 88 year old male with a history of low back wound subsequent to L3-L5 spine surgery 12/2022 now s/p excision of back wound with placement of donated myriad performed in office 03/19/2023 Dr. Ulice Bold who returns to clinic for postprocedural follow-up.   He was last seen here in clinic on 06/03/2023.  At that time, he reported that he had an upcoming appointment with Dr. Yetta Barre.  Denied any complications with the wound VAC.  Persistent defect noted on exam, could appreciate fluid from pinpoint hole when palpating immediately adjacent to the wound.  Replaced wound VAC.  Follow-up in 1 week.  Can discontinue if still no considerable improvement or change.  Reviewed chart and myocardial perfusion imaging from 06/07/2023 demonstrated no evidence of ischemia and the study was overall considered low risk.  Today, patient is accompanied by family at bedside.  Denies any issues or complaints of the wound VAC since last encounter.  Hopeful that there has been some improvement.  Otherwise, tells me that he has an appointment with his neurosurgeon, Dr. Yetta Barre, in 5 days.   Allergies  Allergen Reactions   Nsaids Other (See Comments)    CKD   Prednisone Other (See Comments)    Lowers heart rate   Methyldopa Itching and Rash   Plavix [Clopidogrel Bisulfate] Itching and Rash   Spironolactone Rash   Zithromax [Azithromycin] Palpitations    Outpatient Encounter Medications as of 06/10/2023  Medication Sig   acetaminophen (TYLENOL) 500 MG tablet Take 500-1,000 mg by mouth every 6 (six) hours as needed (pain.).   amLODipine (NORVASC) 10 MG tablet Take 10 mg by mouth at bedtime.   aspirin 81 MG chewable tablet aspirin 81 mg chewable tablet  Chew 1 tablet every day by oral route.    atorvastatin (LIPITOR) 80 MG tablet Take 80 mg by mouth at bedtime.   beta carotene w/minerals (OCUVITE) tablet Take 1 tablet by mouth in the morning.   doxazosin (CARDURA) 8 MG tablet Take 8 mg by mouth in the morning.   HYDROcodone-acetaminophen (NORCO) 7.5-325 MG tablet Take 1 tablet by mouth every 4 (four) hours as needed for moderate pain.   isosorbide mononitrate (IMDUR) 30 MG 24 hr tablet TAKE 1/2 TABLET BY MOUTH DAILY, MAY TAKE 1/2 TABLET EXTRA DAILY ONLY AS NEEDED FOR CHEST PAIN   lisinopril (ZESTRIL) 20 MG tablet TAKE 1 TABLET EVERY DAY   Multiple Vitamin (MULTIVITAMIN WITH MINERALS) TABS tablet Take 1 tablet by mouth in the morning. One A Day Multivitamin   omeprazole (PRILOSEC) 40 MG capsule Take 40 mg by mouth daily before breakfast.   amoxicillin (AMOXIL) 500 MG capsule Take 2,000 mg by mouth as directed. Take 4 capsules (2000 mg) by mouth 1 hour prior to dental procedure. (Patient not taking: Reported on 06/10/2023)   Facility-Administered Encounter Medications as of 06/10/2023  Medication   methocarbamol (ROBAXIN) tablet 500 mg     Past Medical History:  Diagnosis Date   Arthritis    Atrioventricular block, Mobitz type 1, Wenckebach    Bradycardia    Coronary artery disease    ETT-Myoview EF 51%, inf infarct, no ischemia, low risk   GERD (gastroesophageal reflux disease)    History of echocardiogram    Echo 3/17: EF  55-60%, inferior HK, grade 1 diastolic dysfunctione   History of kidney stones    Hypertension    Macular degeneration    Myocardial infarction (HCC) 2000   with stent   PONV (postoperative nausea and vomiting)    Sleep apnea    cpap in use    Stage 3a chronic kidney disease (CKD) (HCC) 12/02/2021    Past Surgical History:  Procedure Laterality Date   CORONARY ANGIOPLASTY     EYE SURGERY     bilateral cataract surgery with lens implant   KNEE SURGERY Right    LEFT HEART CATHETERIZATION WITH CORONARY ANGIOGRAM N/A 07/01/2011   Procedure: LEFT HEART  CATHETERIZATION WITH CORONARY ANGIOGRAM;  Surgeon: Donato Schultz, MD;  Location: [facility] CATH LAB;  Service: Cardiovascular;  Laterality: N/A;   left wrist surgery     left wrist fracture   LUMBAR LAMINECTOMY/DECOMPRESSION MICRODISCECTOMY Left 01/11/2023   Procedure: Laminectomy and Foraminotomy - Lumbar Three-Four/Lumbar Four-Five, Left Microdiscectomy Lumbar Three-Four;  Surgeon: Arman Bogus, MD;  Location: [facility] OR;  Service: Neurosurgery;  Laterality: Left;   PARTIAL KNEE ARTHROPLASTY Left 08/02/2017   Procedure: LEFT UNICOMPARTMENTAL KNEE;  Surgeon: Durene Romans, MD;  Location: [facility] ORS;  Service: Orthopedics;  Laterality: Left;   PARTIAL KNEE ARTHROPLASTY Right 03/02/2019   Procedure: UNICOMPARTMENTAL KNEE Medially;  Surgeon: Durene Romans, MD;  Location: [facility] ORS;  Service: Orthopedics;  Laterality: Right;  90 mins    Family History  Problem Relation Age of Onset   Coronary artery disease Father    Kidney disease Sister    Hyperlipidemia Sister    Heart disease Brother    Diabetes Brother    Coronary artery disease Brother    Heart attack Sister     Social History   Social History Narrative   Not on file     Review of Systems General: Denies fevers or chills Skin: Denies any issues with wound VAC  Physical Exam    06/10/2023    1:40 PM 06/07/2023    7:29 AM 06/03/2023    2:06 PM  Vitals with BMI  Height 5' 5.5" 5\' 5"    Weight 185 lbs 186 lbs   BMI 30.31 30.95   Systolic 133  138  Diastolic 76  75  Pulse 95  73    General:  No acute distress, nontoxic appearing  Respiratory: No increased work of breathing Neuro: Alert and oriented Psychiatric: Normal mood and affect  Skin: Wound VAC is removed and wound remains unchanged.  Still able to express clearish drainage through base of wound with mild palpation.  Surrounding skin slightly irritated, likely moisture mediated.  Assessment/Plan  Chronic low back wound subsequent to L3-L5 spine surgery 12/2022:   Unfortunately  there has not been any considerable improvement.  He has had myriad tissue matrix placed on multiple occasions dating back to 03/19/2023 and has now been on negative pressure wound VAC therapy for several weeks without any resolution.  At this time, he is ready to discontinue the wound VAC.  Patient wants to report conservative therapy and hopefully proceed to surgical intervention with Dr. Yetta Barre for repair of the chronic wound.  Patient and family appreciative of our efforts.  Fortunately his recent cardiac workup was reassuring and feels more comfortable about the prospect of general anesthesia.  Will have him return to our clinic in 8 days to discuss what his plans are moving forward.  Dr. Ulice Bold to see patient, as well.  Picture(s) obtained of the patient and placed in  the chart were with the patient's or guardian's permission.   Evelena Leyden PA-C 06/10/2023, 3:26 PM

## 2023-06-15 ENCOUNTER — Other Ambulatory Visit: Payer: Self-pay | Admitting: Neurological Surgery

## 2023-06-15 DIAGNOSIS — T8130XA Disruption of wound, unspecified, initial encounter: Secondary | ICD-10-CM | POA: Diagnosis not present

## 2023-06-15 DIAGNOSIS — Z6829 Body mass index (BMI) 29.0-29.9, adult: Secondary | ICD-10-CM | POA: Diagnosis not present

## 2023-06-17 NOTE — Progress Notes (Signed)
Surgical Instructions   Your procedure is scheduled on Monday, June 21, 2023. Report to [facility] Main Entrance "A" at 8:30 A.M., then check in with the Admitting office. Any questions or running late day of surgery: call [phone]  Questions prior to your surgery date: call [phone], Monday-Friday, 8am-4pm. If you experience any cold or flu symptoms such as cough, fever, chills, shortness of breath, etc. between now and your scheduled surgery, please notify us at the above number.     Remember:  Do not eat or drink after midnight the night before your surgery    Take these medicines the morning of surgery with A SIP OF WATER  doxazosin (CARDURA)  isosorbide mononitrate (IMDUR)  omeprazole (PRILOSEC)   May take these medicines IF NEEDED: acetaminophen (TYLENOL)    Follow your surgeon's instructions on when to stop Asprin.  If no instructions were given by your surgeon then you will need to call the office to get those instructions.     One week prior to surgery, STOP taking any Aleve, Naproxen, Ibuprofen, Motrin, Advil, Goody's, BC's, all herbal medications, fish oil, and non-prescription vitamins.                     Do NOT Smoke (Tobacco/Vaping) for 24 hours prior to your procedure.  If you use a CPAP at night, you may bring your mask/headgear for your overnight stay.   You will be asked to remove any contacts, glasses, piercing's, hearing aid's, dentures/partials prior to surgery. Please bring cases for these items if needed.    Patients discharged the day of surgery will not be allowed to drive home, and someone needs to stay with them for 24 hours.  SURGICAL WAITING ROOM VISITATION Patients may have no more than 2 support people in the waiting area - these visitors may rotate.   Pre-op nurse will coordinate an appropriate time for 1 ADULT support person, who may not rotate, to accompany patient in pre-op.  Children under the age of 73 must have an adult with  them who is not the patient and must remain in the main waiting area with an adult.  If the patient needs to stay at the hospital during part of their recovery, the visitor guidelines for inpatient rooms apply.  Please refer to the [facility] website for the visitor guidelines for any additional information.   If you received a COVID test during your pre-op visit  it is requested that you wear a mask when out in public, stay away from anyone that may not be feeling well and notify your surgeon if you develop symptoms. If you have been in contact with anyone that has tested positive in the last 10 days please notify you surgeon.      Pre-operative CHG Bathing Instructions   You can play a key role in reducing the risk of infection after surgery. Your skin needs to be as free of germs as possible. You can reduce the number of germs on your skin by washing with CHG (chlorhexidine gluconate) soap before surgery. CHG is an antiseptic soap that kills germs and continues to kill germs even after washing.   DO NOT use if you have an allergy to chlorhexidine/CHG or antibacterial soaps. If your skin becomes reddened or irritated, stop using the CHG and notify one of our RNs at [phone].              TAKE A SHOWER THE NIGHT BEFORE SURGERY AND THE DAY  OF SURGERY    Please keep in mind the following:  DO NOT shave, including legs and underarms, 48 hours prior to surgery.   You may shave your face before/day of surgery.  Place clean sheets on your bed the night before surgery Use a clean washcloth (not used since being washed) for each shower. DO NOT sleep with pet's night before surgery.  CHG Shower Instructions:  Wash your face and private area with normal soap. If you choose to wash your hair, wash first with your normal shampoo.  After you use shampoo/soap, rinse your hair and body thoroughly to remove shampoo/soap residue.  Turn the water OFF and apply half the bottle of CHG soap to a CLEAN  washcloth.  Apply CHG soap ONLY FROM YOUR NECK DOWN TO YOUR TOES (washing for 3-5 minutes)  DO NOT use CHG soap on face, private areas, open wounds, or sores.  Pay special attention to the area where your surgery is being performed.  If you are having back surgery, having someone wash your back for you may be helpful. Wait 2 minutes after CHG soap is applied, then you may rinse off the CHG soap.  Pat dry with a clean towel  Put on clean pajamas    Additional instructions for the day of surgery: DO NOT APPLY any lotions, deodorants, or cologne.   Do not wear jewelry Do not bring valuables to the hospital. [facility] is not responsible for valuables/personal belongings. Put on clean/comfortable clothes.  Please brush your teeth.  Ask your nurse before applying any prescription medications to the skin.

## 2023-06-18 ENCOUNTER — Ambulatory Visit (HOSPITAL_BASED_OUTPATIENT_CLINIC_OR_DEPARTMENT_OTHER): Payer: Medicare HMO

## 2023-06-18 ENCOUNTER — Encounter (HOSPITAL_COMMUNITY)
Admission: RE | Admit: 2023-06-18 | Discharge: 2023-06-18 | Disposition: A | Discharge status: Home / Self Care | Payer: Medicare HMO | Source: Ambulatory Visit | Attending: Neurological Surgery | Admitting: Neurological Surgery

## 2023-06-18 ENCOUNTER — Other Ambulatory Visit: Payer: Self-pay

## 2023-06-18 ENCOUNTER — Encounter: Payer: Medicare HMO | Admitting: Physician Assistant

## 2023-06-18 ENCOUNTER — Encounter (HOSPITAL_COMMUNITY): Payer: Self-pay

## 2023-06-18 VITALS — BP 135/69 | HR 84 | Temp 97.6°F | Resp 17 | Ht 65.5 in | Wt 183.8 lb

## 2023-06-18 DIAGNOSIS — R079 Chest pain, unspecified: Secondary | ICD-10-CM | POA: Diagnosis present

## 2023-06-18 DIAGNOSIS — I455 Other specified heart block: Secondary | ICD-10-CM | POA: Insufficient documentation

## 2023-06-18 DIAGNOSIS — I2510 Atherosclerotic heart disease of native coronary artery without angina pectoris: Secondary | ICD-10-CM

## 2023-06-18 DIAGNOSIS — R5383 Other fatigue: Secondary | ICD-10-CM | POA: Insufficient documentation

## 2023-06-18 DIAGNOSIS — Z01818 Encounter for other preprocedural examination: Secondary | ICD-10-CM | POA: Diagnosis not present

## 2023-06-18 DIAGNOSIS — I252 Old myocardial infarction: Secondary | ICD-10-CM | POA: Diagnosis not present

## 2023-06-18 DIAGNOSIS — Y838 Other surgical procedures as the cause of abnormal reaction of the patient, or of later complication, without mention of misadventure at the time of the procedure: Secondary | ICD-10-CM | POA: Diagnosis present

## 2023-06-18 DIAGNOSIS — E785 Hyperlipidemia, unspecified: Secondary | ICD-10-CM

## 2023-06-18 DIAGNOSIS — N1831 Chronic kidney disease, stage 3a: Secondary | ICD-10-CM | POA: Diagnosis present

## 2023-06-18 DIAGNOSIS — Z83438 Family history of other disorder of lipoprotein metabolism and other lipidemia: Secondary | ICD-10-CM | POA: Diagnosis not present

## 2023-06-18 DIAGNOSIS — S31000D Unspecified open wound of lower back and pelvis without penetration into retroperitoneum, subsequent encounter: Secondary | ICD-10-CM | POA: Diagnosis not present

## 2023-06-18 DIAGNOSIS — S31000A Unspecified open wound of lower back and pelvis without penetration into retroperitoneum, initial encounter: Secondary | ICD-10-CM | POA: Diagnosis not present

## 2023-06-18 DIAGNOSIS — Z8249 Family history of ischemic heart disease and other diseases of the circulatory system: Secondary | ICD-10-CM | POA: Diagnosis not present

## 2023-06-18 DIAGNOSIS — Z833 Family history of diabetes mellitus: Secondary | ICD-10-CM | POA: Diagnosis not present

## 2023-06-18 DIAGNOSIS — G4733 Obstructive sleep apnea (adult) (pediatric): Secondary | ICD-10-CM

## 2023-06-18 DIAGNOSIS — Z9841 Cataract extraction status, right eye: Secondary | ICD-10-CM | POA: Diagnosis not present

## 2023-06-18 DIAGNOSIS — Z955 Presence of coronary angioplasty implant and graft: Secondary | ICD-10-CM | POA: Insufficient documentation

## 2023-06-18 DIAGNOSIS — I129 Hypertensive chronic kidney disease with stage 1 through stage 4 chronic kidney disease, or unspecified chronic kidney disease: Secondary | ICD-10-CM | POA: Insufficient documentation

## 2023-06-18 DIAGNOSIS — Z79899 Other long term (current) drug therapy: Secondary | ICD-10-CM | POA: Insufficient documentation

## 2023-06-18 DIAGNOSIS — T148XXA Other injury of unspecified body region, initial encounter: Secondary | ICD-10-CM | POA: Diagnosis present

## 2023-06-18 DIAGNOSIS — K219 Gastro-esophageal reflux disease without esophagitis: Secondary | ICD-10-CM | POA: Insufficient documentation

## 2023-06-18 DIAGNOSIS — N183 Chronic kidney disease, stage 3 unspecified: Secondary | ICD-10-CM | POA: Insufficient documentation

## 2023-06-18 DIAGNOSIS — Z87442 Personal history of urinary calculi: Secondary | ICD-10-CM | POA: Diagnosis not present

## 2023-06-18 DIAGNOSIS — Z841 Family history of disorders of kidney and ureter: Secondary | ICD-10-CM | POA: Diagnosis not present

## 2023-06-18 DIAGNOSIS — Z886 Allergy status to analgesic agent status: Secondary | ICD-10-CM | POA: Diagnosis not present

## 2023-06-18 DIAGNOSIS — K0889 Other specified disorders of teeth and supporting structures: Secondary | ICD-10-CM | POA: Diagnosis present

## 2023-06-18 DIAGNOSIS — I10 Essential (primary) hypertension: Secondary | ICD-10-CM

## 2023-06-18 DIAGNOSIS — I441 Atrioventricular block, second degree: Secondary | ICD-10-CM | POA: Diagnosis present

## 2023-06-18 DIAGNOSIS — Z961 Presence of intraocular lens: Secondary | ICD-10-CM | POA: Diagnosis present

## 2023-06-18 DIAGNOSIS — T81328A Disruption or dehiscence of closure of other specified internal operation (surgical) wound, initial encounter: Secondary | ICD-10-CM | POA: Diagnosis present

## 2023-06-18 DIAGNOSIS — Z96653 Presence of artificial knee joint, bilateral: Secondary | ICD-10-CM | POA: Diagnosis present

## 2023-06-18 DIAGNOSIS — Z9842 Cataract extraction status, left eye: Secondary | ICD-10-CM | POA: Diagnosis not present

## 2023-06-18 DIAGNOSIS — H3530 Unspecified macular degeneration: Secondary | ICD-10-CM | POA: Diagnosis present

## 2023-06-18 DIAGNOSIS — Z7982 Long term (current) use of aspirin: Secondary | ICD-10-CM | POA: Diagnosis not present

## 2023-06-18 LAB — BASIC METABOLIC PANEL
Anion gap: 10 (ref 5–15)
BUN: 12 mg/dL (ref 8–23)
CO2: 24 mmol/L (ref 22–32)
Calcium: 8.9 mg/dL (ref 8.9–10.3)
Chloride: 102 mmol/L (ref 98–111)
Creatinine, Ser: 1.43 mg/dL — ABNORMAL HIGH (ref 0.61–1.24)
GFR, Estimated: 48 mL/min — ABNORMAL LOW (ref >60)
Glucose, Bld: 126 mg/dL — ABNORMAL HIGH (ref 70–99)
Potassium: 3.9 mmol/L (ref 3.5–5.1)
Sodium: 136 mmol/L (ref 135–145)

## 2023-06-18 LAB — ECHOCARDIOGRAM COMPLETE
Area-P 1/2: 2.91 cm2
S' Lateral: 4.05 cm

## 2023-06-18 LAB — CBC
HCT: 42.7 % (ref 39.0–52.0)
Hemoglobin: 13.9 g/dL (ref 13.0–17.0)
MCH: 30.9 pg (ref 26.0–34.0)
MCHC: 32.6 g/dL (ref 30.0–36.0)
MCV: 94.9 fL (ref 80.0–100.0)
Platelets: 155 10*3/uL (ref 150–400)
RBC: 4.5 MIL/uL (ref 4.22–5.81)
RDW: 13.8 % (ref 11.5–15.5)
WBC: 5.6 10*3/uL (ref 4.0–10.5)
nRBC: 0 % (ref 0.0–0.2)

## 2023-06-18 LAB — SURGICAL PCR SCREEN
MRSA, PCR: NEGATIVE
Staphylococcus aureus: NEGATIVE

## 2023-06-18 NOTE — Progress Notes (Signed)
PCP - Peri Maris, FNP Cardiologist - denies Nephrologist- Dr. Crista Elliot  PPM/ICD - denies   Chest x-ray - 05/13/23 EKG - 05/14/23 Stress Test - 06/07/23 ECHO - 06/18/23 Cardiac Cath - 2017, per pt  Sleep Study - OSA+ CPAP - nightly, pt unsure of pressure settings  DM- denies  Last dose of GLP1 agonist-  n/a   Blood Thinner Instructions: n/a Aspirin Instructions: Last dose 1/26 (hold 5-7 days)  ERAS Protcol - no, NPO   COVID TEST- n/a   Anesthesia review: yes, cardiac hx (Pt had recent stress test and ECHO)  Patient denies shortness of breath, fever, cough and chest pain at PAT appointment   All instructions explained to the patient, with a verbal understanding of the material. Patient agrees to go over the instructions while at home for a better understanding. The opportunity to ask questions was provided.

## 2023-06-18 NOTE — Progress Notes (Signed)
Anesthesia Chart Review:  Follows with cardiology for hx of HTN, HLD, OSA on CPAP, diastolic function, bradycardia, Weinkebach block type I heart block, coronary artery disease status post MI in 2002 with stent to RCA. Recent episode of chest pain on 05/13/2023, resolved with nitroglycerin. Negative troponins and EKG in the emergency room.  He was seen in follow-up by Robin Searing, NP on 05/18/2023.  Nuclear stress test and echocardiogram were ordered at that time.  Stress test 06/07/2023 was low risk, nonischemic.  Echo 06/18/2023 showed EF 55 to 60%, grade 1 DD, no significant valvular abnormalities.   Patient is status post recent lumbar laminectomy 01/11/2023 and has had persistent issues with nonhealing wound dehiscence.  Other pertinent history includes PONV, CKD 3, GERD maintained on omeprazole.   Preop labs reviewed, creatinine mildly elevated 1.43 consistent with history of CKD, otherwise WNL.   EKG 01/07/2023: Sinus rhythm with 1st degree A-V block. Rate 71. Inferior infarct , age undetermined   TTE 06/18/2023:  1. Left ventricular ejection fraction, by estimation, is 55 to 60%. The  left ventricle has normal function. The left ventricle has no regional  wall motion abnormalities. Left ventricular diastolic parameters are  consistent with Grade I diastolic  dysfunction (impaired relaxation).   2. Right ventricular systolic function is normal. The right ventricular  size is normal. There is normal pulmonary artery systolic pressure. The  estimated right ventricular systolic pressure is 28.6 mmHg.   3. Left atrial size was mildly dilated.   4. The mitral valve is normal in structure. Trivial mitral valve  regurgitation. No evidence of mitral stenosis.   5. The aortic valve is normal in structure. Aortic valve regurgitation is  not visualized. No aortic stenosis is present.   6. Aortic dilatation noted. There is borderline dilatation of the  ascending aorta, measuring 38 mm.   7. The  inferior vena cava is normal in size with greater than 50%  respiratory variability, suggesting right atrial pressure of 3 mmHg.   Nuclear stress 06/07/2023:    Findings are consistent with infarction. The study is low risk.   No ST deviation was noted.   LV perfusion is abnormal. There is no evidence of ischemia. There is evidence of infarction. Defect 1: There is a medium defect with moderate reduction in uptake present in the apical to basal inferior location(s) that is fixed. There is abnormal wall motion in the defect area. Consistent with infarction.   Left ventricular function is normal. Nuclear stress EF: 58%. The left ventricular ejection fraction is normal (55-65%). End diastolic cavity size is normal. End systolic cavity size is normal.   Prior study available for comparison from 08/12/2015.   Fixed inferior perfusion defect with abnormal wall motion consistent with infarct Low risk study. No ischemia  Event monitor 01/05/22:   Sinus rhythm with first degree AV block.   Rare short bursts of atrial tachycardia - benign   Rare PAC/PVC's   Brief second degree HB type 1 (Wenckebach) was present   Bradycardia (40's) noted during sleep - asymptomatic. No indication for pacemaker at this time.    Zannie Cove [facility] Short Stay Center/Anesthesiology Phone [phone] 06/18/2023 4:04 PM

## 2023-06-18 NOTE — Anesthesia Preprocedure Evaluation (Signed)
Anesthesia Evaluation  Patient identified by MRN, date of birth, ID band Patient awake    Reviewed: Allergy & Precautions, NPO status , Patient's Chart, lab work & pertinent test results, reviewed documented beta blocker date and time   History of Anesthesia Complications (+) PONV and history of anesthetic complications  Airway Mallampati: II       Dental  (+) Teeth Intact, Dental Advisory Given, Partial Upper, Missing, Loose,    Pulmonary sleep apnea and Continuous Positive Airway Pressure Ventilation    Pulmonary exam normal breath sounds clear to auscultation       Cardiovascular hypertension, Pt. on medications + CAD, + Past MI and + Cardiac Stents  Normal cardiovascular exam+ dysrhythmias  Rhythm:Regular Rate:Normal  MI 2002 Stent RCA  Nuclear Stress test 06/07/23   Findings are consistent with infarction. The study is low risk.   No ST deviation was noted.   LV perfusion is abnormal. There is no evidence of ischemia. There is evidence of infarction. Defect 1: There is a medium defect with moderate reduction in uptake present in the apical to basal inferior location(s) that is fixed. There is abnormal wall motion in the defect area. Consistent with infarction.   Left ventricular function is normal. Nuclear stress EF: 58%. The left ventricular ejection fraction is normal (55-65%). End diastolic cavity size is normal. End systolic cavity size is normal.   Prior study available for comparison from 08/12/2015.   1. Fixed inferior perfusion defect with abnormal wall motion consistent with infarct 2. Low risk study. No ischemia    Neuro/Psych negative neurological ROS  negative psych ROS   GI/Hepatic Neg liver ROS, PUD,GERD  Medicated,,  Endo/Other  Hyperlipidemia  Renal/GU Renal InsufficiencyRenal disease  negative genitourinary   Musculoskeletal  (+) Arthritis , Osteoarthritis,  Lumbar wound   Abdominal    Peds  Hematology negative hematology ROS (+)   Anesthesia Other Findings   Reproductive/Obstetrics                             Anesthesia Physical Anesthesia Plan  ASA: 3  Anesthesia Plan: General   Post-op Pain Management: Minimal or no pain anticipated   Induction: Intravenous  PONV Risk Score and Plan: 4 or greater and Treatment may vary due to age or medical condition, Ondansetron and Dexamethasone  Airway Management Planned: Oral ETT  Additional Equipment: None  Intra-op Plan:   Post-operative Plan: Extubation in OR  Informed Consent: I have reviewed the patients History and Physical, chart, labs and discussed the procedure including the risks, benefits and alternatives for the proposed anesthesia with the patient or authorized representative who has indicated his/her understanding and acceptance.     Dental advisory given  Plan Discussed with: CRNA and Anesthesiologist  Anesthesia Plan Comments: (PAT note by Antionette Poles, PA-C: Follows with cardiology for hx of HTN, HLD, OSA on CPAP, diastolic function, bradycardia, Weinkebach block type I heart block, coronary artery disease status post MI in 2002 with stent to RCA. Recent episode of chest pain on 05/13/2023, resolved with nitroglycerin. Negative troponins and EKG in the emergency room.  He was seen in follow-up by Robin Searing, NP on 05/18/2023.  Nuclear stress test and echocardiogram were ordered at that time.  Stress test 06/07/2023 was low risk, nonischemic.  Echo 06/18/2023 showed EF 55 to 60%, grade 1 DD, no significant valvular abnormalities.  Patient is status post recent lumbar laminectomy 01/11/2023 and has had persistent issues with  nonhealing wound dehiscence.  Other pertinent history includes PONV, CKD 3, GERD maintained on omeprazole.  Preop labs reviewed, creatinine mildly elevated 1.43 consistent with history of CKD, otherwise WNL.  EKG 01/07/2023: Sinus rhythm with 1st  degree A-V block. Rate 71. Inferior infarct , age undetermined  TTE 06/18/2023: 1. Left ventricular ejection fraction, by estimation, is 55 to 60%. The  left ventricle has normal function. The left ventricle has no regional  wall motion abnormalities. Left ventricular diastolic parameters are  consistent with Grade I diastolic  dysfunction (impaired relaxation).  2. Right ventricular systolic function is normal. The right ventricular  size is normal. There is normal pulmonary artery systolic pressure. The  estimated right ventricular systolic pressure is 28.6 mmHg.  3. Left atrial size was mildly dilated.  4. The mitral valve is normal in structure. Trivial mitral valve  regurgitation. No evidence of mitral stenosis.  5. The aortic valve is normal in structure. Aortic valve regurgitation is  not visualized. No aortic stenosis is present.  6. Aortic dilatation noted. There is borderline dilatation of the  ascending aorta, measuring 38 mm.  7. The inferior vena cava is normal in size with greater than 50%  respiratory variability, suggesting right atrial pressure of 3 mmHg.   Nuclear stress 06/07/2023:    Findings are consistent with infarction. The study is low risk.   No ST deviation was noted.   LV perfusion is abnormal. There is no evidence of ischemia. There is evidence of infarction. Defect 1: There is a medium defect with moderate reduction in uptake present in the apical to basal inferior location(s) that is fixed. There is abnormal wall motion in the defect area. Consistent with infarction.   Left ventricular function is normal. Nuclear stress EF: 58%. The left ventricular ejection fraction is normal (55-65%). End diastolic cavity size is normal. End systolic cavity size is normal.   Prior study available for comparison from 08/12/2015.  1. Fixed inferior perfusion defect with abnormal wall motion consistent with infarct 2. Low risk study. No ischemia  Event monitor  01/05/22:   Sinus rhythm with first degree AV block.   Rare short bursts of atrial tachycardia - benign   Rare PAC/PVC's   Brief second degree HB type 1 (Wenckebach) was present   Bradycardia (40's) noted during sleep - asymptomatic. No indication for pacemaker at this time.   )        Anesthesia Quick Evaluation

## 2023-06-18 NOTE — Pre-Procedure Instructions (Signed)
Surgical Instructions   Your procedure is scheduled on Monday, February 3rd. Report to [facility] Main Entrance "A" at 08:30 A.M., then check in with the Admitting office. Any questions or running late day of surgery: call [phone]  Questions prior to your surgery date: call [phone], Monday-Friday, 8am-4pm. If you experience any cold or flu symptoms such as cough, fever, chills, shortness of breath, etc. between now and your scheduled surgery, please notify us at the above number.     Remember:  Do not eat or drink after midnight the night before your surgery      Take these medicines the morning of surgery with A SIP OF WATER  doxazosin (CARDURA)  omeprazole (PRILOSEC)   May take these medicines IF NEEDED: acetaminophen (TYLENOL)    Follow your surgeon's instructions on when to stop Aspirin.  If no instructions were given by your surgeon then you will need to call the office to get those instructions.    One week prior to surgery, STOP taking any Aleve, Naproxen, Ibuprofen, Motrin, Advil, Goody's, BC's, all herbal medications, fish oil, and non-prescription vitamins.                     Do NOT Smoke (Tobacco/Vaping) for 24 hours prior to your procedure.  If you use a CPAP at night, you may bring your mask/headgear for your overnight stay.   You will be asked to remove any contacts, glasses, piercing's, hearing aid's, dentures/partials prior to surgery. Please bring cases for these items if needed.    Patients discharged the day of surgery will not be allowed to drive home, and someone needs to stay with them for 24 hours.  SURGICAL WAITING ROOM VISITATION Patients may have no more than 2 support people in the waiting area - these visitors may rotate.   Pre-op nurse will coordinate an appropriate time for 1 ADULT support person, who may not rotate, to accompany patient in pre-op.  Children under the age of 72 must have an adult with them who is not the patient and  must remain in the main waiting area with an adult.  If the patient needs to stay at the hospital during part of their recovery, the visitor guidelines for inpatient rooms apply.  Please refer to the [facility] website for the visitor guidelines for any additional information.   If you received a COVID test during your pre-op visit  it is requested that you wear a mask when out in public, stay away from anyone that may not be feeling well and notify your surgeon if you develop symptoms. If you have been in contact with anyone that has tested positive in the last 10 days please notify you surgeon.      Pre-operative 5 CHG Bathing Instructions   You can play a key role in reducing the risk of infection after surgery. Your skin needs to be as free of germs as possible. You can reduce the number of germs on your skin by washing with CHG (chlorhexidine gluconate) soap before surgery. CHG is an antiseptic soap that kills germs and continues to kill germs even after washing.   DO NOT use if you have an allergy to chlorhexidine/CHG or antibacterial soaps. If your skin becomes reddened or irritated, stop using the CHG and notify one of our RNs at [phone].   Please shower with the CHG soap starting 4 days before surgery using the following schedule:     Please keep in mind the  following:  DO NOT shave, including legs and underarms, starting the day of your first shower.   You may shave your face at any point before/day of surgery.  Place clean sheets on your bed the day you start using CHG soap. Use a clean washcloth (not used since being washed) for each shower. DO NOT sleep with pets once you start using the CHG.   CHG Shower Instructions:  Wash your face and private area with normal soap. If you choose to wash your hair, wash first with your normal shampoo.  After you use shampoo/soap, rinse your hair and body thoroughly to remove shampoo/soap residue.  Turn the water OFF and apply  about 3 tablespoons (45 ml) of CHG soap to a CLEAN washcloth.  Apply CHG soap ONLY FROM YOUR NECK DOWN TO YOUR TOES (washing for 3-5 minutes)  DO NOT use CHG soap on face, private areas, open wounds, or sores.  Pay special attention to the area where your surgery is being performed.  If you are having back surgery, having someone wash your back for you may be helpful. Wait 2 minutes after CHG soap is applied, then you may rinse off the CHG soap.  Pat dry with a clean towel  Put on clean clothes/pajamas   If you choose to wear lotion, please use ONLY the CHG-compatible lotions that are listed below.  Additional instructions for the day of surgery: DO NOT APPLY any lotions, deodorants, cologne, or perfumes.   Do not bring valuables to the hospital. [facility] is not responsible for any belongings/valuables. Do not wear nail polish, gel polish, artificial nails, or any other type of covering on natural nails (fingers and toes) Do not wear jewelry or makeup Put on clean/comfortable clothes.  Please brush your teeth.  Ask your nurse before applying any prescription medications to the skin.     CHG Compatible Lotions   Aveeno Moisturizing lotion  Cetaphil Moisturizing Cream  Cetaphil Moisturizing Lotion  Clairol Herbal Essence Moisturizing Lotion, Dry Skin  Clairol Herbal Essence Moisturizing Lotion, Extra Dry Skin  Clairol Herbal Essence Moisturizing Lotion, Normal Skin  Curel Age Defying Therapeutic Moisturizing Lotion with Alpha Hydroxy  Curel Extreme Care Body Lotion  Curel Soothing Hands Moisturizing Hand Lotion  Curel Therapeutic Moisturizing Cream, Fragrance-Free  Curel Therapeutic Moisturizing Lotion, Fragrance-Free  Curel Therapeutic Moisturizing Lotion, Original Formula  Eucerin Daily Replenishing Lotion  Eucerin Dry Skin Therapy Plus Alpha Hydroxy Crme  Eucerin Dry Skin Therapy Plus Alpha Hydroxy Lotion  Eucerin Original Crme  Eucerin Original Lotion  Eucerin Plus  Crme Eucerin Plus Lotion  Eucerin TriLipid Replenishing Lotion  Keri Anti-Bacterial Hand Lotion  Keri Deep Conditioning Original Lotion Dry Skin Formula Softly Scented  Keri Deep Conditioning Original Lotion, Fragrance Free Sensitive Skin Formula  Keri Lotion Fast Absorbing Fragrance Free Sensitive Skin Formula  Keri Lotion Fast Absorbing Softly Scented Dry Skin Formula  Keri Original Lotion  Keri Skin Renewal Lotion Keri Silky Smooth Lotion  Keri Silky Smooth Sensitive Skin Lotion  Nivea Body Creamy Conditioning Oil  Nivea Body Extra Enriched Lotion  Nivea Body Original Lotion  Nivea Body Sheer Moisturizing Lotion Nivea Crme  Nivea Skin Firming Lotion  NutraDerm 30 Skin Lotion  NutraDerm Skin Lotion  NutraDerm Therapeutic Skin Cream  NutraDerm Therapeutic Skin Lotion  ProShield Protective Hand Cream  Provon moisturizing lotion  Please read over the following fact sheets that you were given.

## 2023-06-21 ENCOUNTER — Other Ambulatory Visit: Payer: Self-pay

## 2023-06-21 ENCOUNTER — Encounter (HOSPITAL_COMMUNITY): Payer: Self-pay | Admitting: Neurological Surgery

## 2023-06-21 ENCOUNTER — Inpatient Hospital Stay (HOSPITAL_COMMUNITY): Payer: Medicare HMO | Admitting: Anesthesiology

## 2023-06-21 ENCOUNTER — Inpatient Hospital Stay (HOSPITAL_COMMUNITY): Payer: Self-pay | Admitting: Physician Assistant

## 2023-06-21 ENCOUNTER — Encounter (HOSPITAL_COMMUNITY)
Admission: RE | Disposition: A | Discharge status: Home / Self Care | Payer: Self-pay | Source: Home / Self Care | Attending: Neurological Surgery

## 2023-06-21 ENCOUNTER — Inpatient Hospital Stay (HOSPITAL_COMMUNITY)
Admission: RE | Admit: 2023-06-21 | Discharge: 2023-06-21 | DRG: 909 | Disposition: A | Discharge status: Home / Self Care | Payer: Medicare HMO | Attending: Neurological Surgery | Admitting: Neurological Surgery

## 2023-06-21 DIAGNOSIS — Z9841 Cataract extraction status, right eye: Secondary | ICD-10-CM | POA: Diagnosis not present

## 2023-06-21 DIAGNOSIS — S31000D Unspecified open wound of lower back and pelvis without penetration into retroperitoneum, subsequent encounter: Secondary | ICD-10-CM | POA: Diagnosis not present

## 2023-06-21 DIAGNOSIS — R079 Chest pain, unspecified: Secondary | ICD-10-CM | POA: Diagnosis present

## 2023-06-21 DIAGNOSIS — Z881 Allergy status to other antibiotic agents status: Secondary | ICD-10-CM

## 2023-06-21 DIAGNOSIS — T148XXA Other injury of unspecified body region, initial encounter: Secondary | ICD-10-CM | POA: Diagnosis present

## 2023-06-21 DIAGNOSIS — Z96653 Presence of artificial knee joint, bilateral: Secondary | ICD-10-CM | POA: Diagnosis present

## 2023-06-21 DIAGNOSIS — Z8249 Family history of ischemic heart disease and other diseases of the circulatory system: Secondary | ICD-10-CM

## 2023-06-21 DIAGNOSIS — Z886 Allergy status to analgesic agent status: Secondary | ICD-10-CM | POA: Diagnosis not present

## 2023-06-21 DIAGNOSIS — K0889 Other specified disorders of teeth and supporting structures: Secondary | ICD-10-CM | POA: Diagnosis present

## 2023-06-21 DIAGNOSIS — Y838 Other surgical procedures as the cause of abnormal reaction of the patient, or of later complication, without mention of misadventure at the time of the procedure: Secondary | ICD-10-CM | POA: Diagnosis present

## 2023-06-21 DIAGNOSIS — Z833 Family history of diabetes mellitus: Secondary | ICD-10-CM | POA: Diagnosis not present

## 2023-06-21 DIAGNOSIS — I129 Hypertensive chronic kidney disease with stage 1 through stage 4 chronic kidney disease, or unspecified chronic kidney disease: Secondary | ICD-10-CM | POA: Diagnosis present

## 2023-06-21 DIAGNOSIS — N1831 Chronic kidney disease, stage 3a: Secondary | ICD-10-CM

## 2023-06-21 DIAGNOSIS — I441 Atrioventricular block, second degree: Secondary | ICD-10-CM | POA: Diagnosis present

## 2023-06-21 DIAGNOSIS — I252 Old myocardial infarction: Secondary | ICD-10-CM | POA: Diagnosis not present

## 2023-06-21 DIAGNOSIS — Z7982 Long term (current) use of aspirin: Secondary | ICD-10-CM

## 2023-06-21 DIAGNOSIS — Z01818 Encounter for other preprocedural examination: Secondary | ICD-10-CM | POA: Diagnosis not present

## 2023-06-21 DIAGNOSIS — K219 Gastro-esophageal reflux disease without esophagitis: Secondary | ICD-10-CM | POA: Diagnosis present

## 2023-06-21 DIAGNOSIS — T81328A Disruption or dehiscence of closure of other specified internal operation (surgical) wound, initial encounter: Secondary | ICD-10-CM | POA: Diagnosis present

## 2023-06-21 DIAGNOSIS — Z83438 Family history of other disorder of lipoprotein metabolism and other lipidemia: Secondary | ICD-10-CM | POA: Diagnosis not present

## 2023-06-21 DIAGNOSIS — Z87442 Personal history of urinary calculi: Secondary | ICD-10-CM | POA: Diagnosis not present

## 2023-06-21 DIAGNOSIS — I2510 Atherosclerotic heart disease of native coronary artery without angina pectoris: Secondary | ICD-10-CM | POA: Diagnosis present

## 2023-06-21 DIAGNOSIS — Z961 Presence of intraocular lens: Secondary | ICD-10-CM | POA: Diagnosis present

## 2023-06-21 DIAGNOSIS — H3530 Unspecified macular degeneration: Secondary | ICD-10-CM | POA: Diagnosis present

## 2023-06-21 DIAGNOSIS — Z841 Family history of disorders of kidney and ureter: Secondary | ICD-10-CM

## 2023-06-21 DIAGNOSIS — Z888 Allergy status to other drugs, medicaments and biological substances status: Secondary | ICD-10-CM

## 2023-06-21 DIAGNOSIS — Z79899 Other long term (current) drug therapy: Secondary | ICD-10-CM

## 2023-06-21 DIAGNOSIS — Z8711 Personal history of peptic ulcer disease: Secondary | ICD-10-CM

## 2023-06-21 DIAGNOSIS — G4733 Obstructive sleep apnea (adult) (pediatric): Secondary | ICD-10-CM | POA: Diagnosis present

## 2023-06-21 DIAGNOSIS — Z9842 Cataract extraction status, left eye: Secondary | ICD-10-CM | POA: Diagnosis not present

## 2023-06-21 DIAGNOSIS — Z955 Presence of coronary angioplasty implant and graft: Secondary | ICD-10-CM

## 2023-06-21 HISTORY — PX: LUMBAR WOUND DEBRIDEMENT: SHX1988

## 2023-06-21 SURGERY — LUMBAR WOUND DEBRIDEMENT
Anesthesia: General | Site: Spine Lumbar

## 2023-06-21 MED ORDER — ONDANSETRON HCL 4 MG/2ML IJ SOLN
INTRAMUSCULAR | Status: DC | PRN
  Administered 2023-06-21: 4 mg via INTRAVENOUS

## 2023-06-21 MED ORDER — SUGAMMADEX SODIUM 200 MG/2ML IV SOLN
INTRAVENOUS | Status: DC | PRN
  Administered 2023-06-21: 200 mg via INTRAVENOUS

## 2023-06-21 MED ORDER — ACETAMINOPHEN 500 MG PO TABS
1000.0000 mg | ORAL_TABLET | ORAL | Status: AC
  Administered 2023-06-21: 1000 mg via ORAL
  Filled 2023-06-21: qty 2

## 2023-06-21 MED ORDER — FENTANYL CITRATE (PF) 250 MCG/5ML IJ SOLN
INTRAMUSCULAR | Status: DC | PRN
  Administered 2023-06-21: 100 ug via INTRAVENOUS
  Administered 2023-06-21: 50 ug via INTRAVENOUS

## 2023-06-21 MED ORDER — LISINOPRIL 20 MG PO TABS: 20.0000 mg | ORAL_TABLET | Freq: Every day | ORAL | Status: DC

## 2023-06-21 MED ORDER — HYDROCODONE-ACETAMINOPHEN 5-325 MG PO TABS: 1.0000 | ORAL_TABLET | ORAL | Status: DC | PRN

## 2023-06-21 MED ORDER — THROMBIN 5000 UNITS EX SOLR
CUTANEOUS | Status: AC
  Filled 2023-06-21: qty 5000

## 2023-06-21 MED ORDER — SODIUM CHLORIDE 0.9% FLUSH: 3.0000 mL | Freq: Two times a day (BID) | INTRAVENOUS | Status: DC

## 2023-06-21 MED ORDER — ACETAMINOPHEN 650 MG RE SUPP: 650.0000 mg | RECTAL | Status: DC | PRN

## 2023-06-21 MED ORDER — ACETAMINOPHEN 325 MG PO TABS: 650.0000 mg | ORAL_TABLET | ORAL | Status: DC | PRN

## 2023-06-21 MED ORDER — ISOSORBIDE MONONITRATE ER 30 MG PO TB24: 30.0000 mg | ORAL_TABLET | Freq: Every day | ORAL | Status: DC

## 2023-06-21 MED ORDER — BUPIVACAINE HCL (PF) 0.25 % IJ SOLN
INTRAMUSCULAR | Status: DC | PRN
  Administered 2023-06-21: 17 mL

## 2023-06-21 MED ORDER — ONDANSETRON HCL 4 MG/2ML IJ SOLN: 4.0000 mg | Freq: Once | INTRAMUSCULAR | Status: DC | PRN

## 2023-06-21 MED ORDER — FENTANYL CITRATE (PF) 250 MCG/5ML IJ SOLN
INTRAMUSCULAR | Status: AC
  Filled 2023-06-21: qty 5

## 2023-06-21 MED ORDER — METHOCARBAMOL 500 MG PO TABS: 500.0000 mg | ORAL_TABLET | Freq: Three times a day (TID) | ORAL | Status: DC

## 2023-06-21 MED ORDER — MENTHOL 3 MG MT LOZG: 1.0000 | LOZENGE | OROMUCOSAL | Status: DC | PRN

## 2023-06-21 MED ORDER — SENNA 8.6 MG PO TABS: 1.0000 | ORAL_TABLET | Freq: Two times a day (BID) | ORAL | Status: DC

## 2023-06-21 MED ORDER — LACTATED RINGERS IV SOLN: INTRAVENOUS | Status: DC

## 2023-06-21 MED ORDER — ASPIRIN 81 MG PO TBEC: 81.0000 mg | DELAYED_RELEASE_TABLET | Freq: Every day | ORAL | Status: DC

## 2023-06-21 MED ORDER — CEFAZOLIN SODIUM-DEXTROSE 1-4 GM/50ML-% IV SOLN: 1.0000 g | Freq: Three times a day (TID) | INTRAVENOUS | Status: DC

## 2023-06-21 MED ORDER — OXYCODONE HCL 5 MG/5ML PO SOLN
5.0000 mg | Freq: Once | ORAL | Status: AC | PRN
  Administered 2023-06-21: 5 mg via ORAL

## 2023-06-21 MED ORDER — CHLORHEXIDINE GLUCONATE 0.12 % MT SOLN
15.0000 mL | Freq: Once | OROMUCOSAL | Status: AC
  Administered 2023-06-21: 15 mL via OROMUCOSAL
  Filled 2023-06-21: qty 15

## 2023-06-21 MED ORDER — FENTANYL CITRATE (PF) 100 MCG/2ML IJ SOLN
25.0000 ug | INTRAMUSCULAR | Status: DC | PRN
  Administered 2023-06-21 (×3): 25 ug via INTRAVENOUS

## 2023-06-21 MED ORDER — PANTOPRAZOLE SODIUM 40 MG PO TBEC: 40.0000 mg | DELAYED_RELEASE_TABLET | Freq: Every day | ORAL | Status: DC

## 2023-06-21 MED ORDER — CHLORHEXIDINE GLUCONATE CLOTH 2 % EX PADS: 6.0000 | MEDICATED_PAD | Freq: Once | CUTANEOUS | Status: DC

## 2023-06-21 MED ORDER — PHENYLEPHRINE HCL-NACL 20-0.9 MG/250ML-% IV SOLN
INTRAVENOUS | Status: AC
  Filled 2023-06-21: qty 500

## 2023-06-21 MED ORDER — PHENOL 1.4 % MT LIQD: 1.0000 | OROMUCOSAL | Status: DC | PRN

## 2023-06-21 MED ORDER — ORAL CARE MOUTH RINSE: 15.0000 mL | Freq: Once | OROMUCOSAL | Status: AC

## 2023-06-21 MED ORDER — PROPOFOL 10 MG/ML IV BOLUS
INTRAVENOUS | Status: AC
  Filled 2023-06-21: qty 20

## 2023-06-21 MED ORDER — ONDANSETRON HCL 4 MG PO TABS: 4.0000 mg | ORAL_TABLET | Freq: Four times a day (QID) | ORAL | Status: DC | PRN

## 2023-06-21 MED ORDER — SODIUM CHLORIDE 0.9% FLUSH: 3.0000 mL | INTRAVENOUS | Status: DC | PRN

## 2023-06-21 MED ORDER — AMISULPRIDE (ANTIEMETIC) 5 MG/2ML IV SOLN: 10.0000 mg | Freq: Once | INTRAVENOUS | Status: DC | PRN

## 2023-06-21 MED ORDER — ONDANSETRON HCL 4 MG/2ML IJ SOLN: 4.0000 mg | Freq: Four times a day (QID) | INTRAMUSCULAR | Status: DC | PRN

## 2023-06-21 MED ORDER — OXYCODONE HCL 5 MG/5ML PO SOLN
ORAL | Status: AC
  Filled 2023-06-21: qty 5

## 2023-06-21 MED ORDER — OCUVITE PO TABS: 1.0000 | ORAL_TABLET | Freq: Every morning | ORAL | Status: DC

## 2023-06-21 MED ORDER — PHENYLEPHRINE 80 MCG/ML (10ML) SYRINGE FOR IV PUSH (FOR BLOOD PRESSURE SUPPORT)
PREFILLED_SYRINGE | INTRAVENOUS | Status: DC | PRN
  Administered 2023-06-21: 160 ug via INTRAVENOUS

## 2023-06-21 MED ORDER — EPHEDRINE SULFATE-NACL 50-0.9 MG/10ML-% IV SOSY
PREFILLED_SYRINGE | INTRAVENOUS | Status: DC | PRN
  Administered 2023-06-21: 5 mg via INTRAVENOUS

## 2023-06-21 MED ORDER — OXYCODONE HCL 5 MG PO TABS: 5.0000 mg | ORAL_TABLET | Freq: Once | ORAL | Status: AC | PRN

## 2023-06-21 MED ORDER — CEFAZOLIN SODIUM-DEXTROSE 2-4 GM/100ML-% IV SOLN
2.0000 g | INTRAVENOUS | Status: AC
  Administered 2023-06-21: 2 g via INTRAVENOUS
  Filled 2023-06-21: qty 100

## 2023-06-21 MED ORDER — MORPHINE SULFATE (PF) 2 MG/ML IV SOLN: 2.0000 mg | INTRAVENOUS | Status: DC | PRN

## 2023-06-21 MED ORDER — PROPOFOL 10 MG/ML IV BOLUS
INTRAVENOUS | Status: DC | PRN
  Administered 2023-06-21: 100 mg via INTRAVENOUS

## 2023-06-21 MED ORDER — BUPIVACAINE HCL (PF) 0.25 % IJ SOLN
INTRAMUSCULAR | Status: AC
  Filled 2023-06-21: qty 30

## 2023-06-21 MED ORDER — ROCURONIUM BROMIDE 10 MG/ML (PF) SYRINGE
PREFILLED_SYRINGE | INTRAVENOUS | Status: DC | PRN
  Administered 2023-06-21: 60 mg via INTRAVENOUS

## 2023-06-21 MED ORDER — AMLODIPINE BESYLATE 10 MG PO TABS: 10.0000 mg | ORAL_TABLET | Freq: Every day | ORAL | Status: DC

## 2023-06-21 MED ORDER — DOXAZOSIN MESYLATE 8 MG PO TABS: 8.0000 mg | ORAL_TABLET | Freq: Every morning | ORAL | Status: DC

## 2023-06-21 MED ORDER — GABAPENTIN 300 MG PO CAPS
300.0000 mg | ORAL_CAPSULE | ORAL | Status: AC
  Administered 2023-06-21: 300 mg via ORAL
  Filled 2023-06-21: qty 1

## 2023-06-21 MED ORDER — LIDOCAINE 2% (20 MG/ML) 5 ML SYRINGE
INTRAMUSCULAR | Status: DC | PRN
  Administered 2023-06-21: 60 mg via INTRAVENOUS

## 2023-06-21 MED ORDER — ADULT MULTIVITAMIN W/MINERALS CH: 1.0000 | ORAL_TABLET | Freq: Every morning | ORAL | Status: DC

## 2023-06-21 MED ORDER — THROMBIN 5000 UNITS EX SOLR
OROMUCOSAL | Status: DC | PRN
  Administered 2023-06-21: 5 mL

## 2023-06-21 MED ORDER — FENTANYL CITRATE (PF) 100 MCG/2ML IJ SOLN
INTRAMUSCULAR | Status: AC
  Filled 2023-06-21: qty 2

## 2023-06-21 MED ORDER — SODIUM CHLORIDE 0.9 % IV SOLN: 250.0000 mL | INTRAVENOUS | Status: DC

## 2023-06-21 SURGICAL SUPPLY — 42 items
BAG COUNTER SPONGE SURGICOUNT (BAG) ×2 IMPLANT
BENZOIN TINCTURE PRP APPL 2/3 (GAUZE/BANDAGES/DRESSINGS) ×2 IMPLANT
CANISTER SUCT 3000ML PPV (MISCELLANEOUS) ×2 IMPLANT
DRAPE LAPAROTOMY 100X72X124 (DRAPES) ×2 IMPLANT
DRESSING PEEL AND PLC PRVNA 13 (GAUZE/BANDAGES/DRESSINGS) IMPLANT
DRESSING PREVENA PLUS CUSTOM (GAUZE/BANDAGES/DRESSINGS) IMPLANT
DRSG PEEL AND PLACE PREVENA 13 (GAUZE/BANDAGES/DRESSINGS) ×1
DRSG PREVENA PLUS CUSTOM (GAUZE/BANDAGES/DRESSINGS) ×1
DURAPREP 26ML APPLICATOR (WOUND CARE) IMPLANT
DURAPREP 6ML APPLICATOR 50/CS (WOUND CARE) IMPLANT
ELECT REM PT RETURN 9FT ADLT (ELECTROSURGICAL) ×1
ELECTRODE REM PT RTRN 9FT ADLT (ELECTROSURGICAL) ×2 IMPLANT
GAUZE 4X4 16PLY ~~LOC~~+RFID DBL (SPONGE) IMPLANT
GLOVE BIO SURGEON STRL SZ7 (GLOVE) IMPLANT
GLOVE BIO SURGEON STRL SZ8 (GLOVE) ×2 IMPLANT
GLOVE BIOGEL PI IND STRL 7.0 (GLOVE) IMPLANT
GOWN STRL REUS W/ TWL LRG LVL3 (GOWN DISPOSABLE) IMPLANT
GOWN STRL REUS W/ TWL XL LVL3 (GOWN DISPOSABLE) IMPLANT
GOWN STRL REUS W/TWL 2XL LVL3 (GOWN DISPOSABLE) ×2 IMPLANT
KIT BASIN OR (CUSTOM PROCEDURE TRAY) ×2 IMPLANT
KIT DRSG PREVENA PLUS 7DAY 125 (MISCELLANEOUS) IMPLANT
KIT TURNOVER KIT B (KITS) ×2 IMPLANT
NDL HYPO 18GX1.5 BLUNT FILL (NEEDLE) IMPLANT
NDL HYPO 25X1 1.5 SAFETY (NEEDLE) ×2 IMPLANT
NDL SPNL 20GX3.5 QUINCKE YW (NEEDLE) IMPLANT
NEEDLE HYPO 18GX1.5 BLUNT FILL (NEEDLE) IMPLANT
NEEDLE HYPO 25X1 1.5 SAFETY (NEEDLE) ×1 IMPLANT
NEEDLE SPNL 20GX3.5 QUINCKE YW (NEEDLE) IMPLANT
NS IRRIG 1000ML POUR BTL (IV SOLUTION) ×2 IMPLANT
PACK LAMINECTOMY NEURO (CUSTOM PROCEDURE TRAY) ×2 IMPLANT
PAD ARMBOARD 7.5X6 YLW CONV (MISCELLANEOUS) ×6 IMPLANT
POWDER MYRIAD MORCELLS 500MG (Miscellaneous) IMPLANT
STRIP CLOSURE SKIN 1/2X4 (GAUZE/BANDAGES/DRESSINGS) ×2 IMPLANT
SUT VIC AB 0 CT1 18XCR BRD8 (SUTURE) ×2 IMPLANT
SUT VIC AB 2-0 CP2 18 (SUTURE) ×2 IMPLANT
SUT VIC AB 3-0 SH 8-18 (SUTURE) ×2 IMPLANT
SWAB COLLECTION DEVICE MRSA (MISCELLANEOUS) ×2 IMPLANT
SWAB CULTURE ESWAB REG 1ML (MISCELLANEOUS) ×2 IMPLANT
SYR 3ML LL SCALE MARK (SYRINGE) IMPLANT
TOWEL GREEN STERILE (TOWEL DISPOSABLE) ×2 IMPLANT
TOWEL GREEN STERILE FF (TOWEL DISPOSABLE) ×2 IMPLANT
WATER STERILE IRR 1000ML POUR (IV SOLUTION) ×2 IMPLANT

## 2023-06-21 NOTE — Progress Notes (Signed)
Patient did well ambulating in PACU, pain level tolerable, unable to void at this time. Discharge instructions reviewed with daughter and placed in bag with patients belongings. Wound vac explained to patient and family. Discharged home with daughter and wife.  Hermina Barters, RN

## 2023-06-21 NOTE — Op Note (Signed)
06/21/2023  11:21 AM  PATIENT:  Bruce [name]  88 y.o. male  PRE-OPERATIVE DIAGNOSIS: Poorly healing lumbar wound  POST-OPERATIVE DIAGNOSIS:  same  PROCEDURE: Irrigation and debridement of lumbar wound with primary closure  SURGEON:  Marikay Alar, MD  ASSISTANTS: Verlin Dike, FNP  ANESTHESIA:   General  EBL: 2 ml  Total I/O In: 700 [I.V.:600; IV Piggyback:100] Out: 2 [Blood:2]  BLOOD ADMINISTERED: none  DRAINS: none  SPECIMEN:  none  INDICATION FOR PROCEDURE: This patient presented with a poorly healing lumbar wound from surgery in August.  He tried several weeks of management through the wound care clinic.  This included a VAC dressing..  Recommended primary revision of the wound. Patient understood the risks, benefits, and alternatives and potential outcomes and wished to proceed.  PROCEDURE DETAILS: The patient was taken the operating room after induction of adequate generalized endotracheal anesthesia he was rolled into the prone position on chest rolls and all pressure points were padded.  His lumbar region was cleaned with Hibiclens and then Betadine scrub and then prepped with DuraPrep and then draped in the usual sterile fashion.  7 cc of local anesthesia was injected and his old incision was ellipsed out.  The underlying tissues were clean and clear of signs of infection but cultures were sent.  The wound was about 6 cm long and about 1-1/2 cm deep.  We irrigated with Vashe solution.  We then placed 500 mg of myriad morsels into the wound.  We then closed with 2-0 Vicryl in the subcutaneous tissues and 3-0 Vicryl in the subcuticular tissues.  A small Prevena external wound VAC negative pressure dressing was then placed.  The patient was awakened from general anesthesia and transferred to cover room stable condition.  At the end of procedure all sponge needle and instrument counts were correct.   PLAN OF CARE: Discharge to home after PACU  PATIENT DISPOSITION:  PACU -  hemodynamically stable.   Delay start of Pharmacological VTE agent (>24hrs) due to surgical blood loss or risk of bleeding:  yes

## 2023-06-21 NOTE — Discharge Summary (Signed)
Physician Discharge Summary  Patient ID: Bruce [name] MRN: [id number] DOB/AGE: [id number] y.o.  Admit date: 06/21/2023 Discharge date: 06/21/2023  Admission Diagnoses: poorly healing wound    Discharge Diagnoses: same   Discharged Condition: good  Hospital Course: The patient was admitted on 06/21/2023 and taken to the operating room where the patient underwent wound revision. The patient tolerated the procedure well and was taken to the recovery room and then to the pacu in stable condition. The hospital course was routine. There were no complications. The wound remained clean dry and intact. Pt had appropriate back soreness. No complaints of leg pain or new N/T/W. The patient remained afebrile with stable vital signs, and tolerated a regular diet. The patient continued to increase activities, and pain was well controlled with oral pain medications.   Consults: None  Significant Diagnostic Studies:  Results for orders placed or performed during the hospital encounter of 06/18/23  Surgical pcr screen   Collection Time: 06/18/23  1:31 PM   Specimen: Nasal Mucosa; Nasal Swab  Result Value Ref Range   MRSA, PCR NEGATIVE NEGATIVE   Staphylococcus aureus NEGATIVE NEGATIVE  Basic metabolic panel per protocol   Collection Time: 06/18/23  2:00 PM  Result Value Ref Range   Sodium 136 135 - 145 mmol/L   Potassium 3.9 3.5 - 5.1 mmol/L   Chloride 102 98 - 111 mmol/L   CO2 24 22 - 32 mmol/L   Glucose, Bld 126 (H) 70 - 99 mg/dL   BUN 12 8 - 23 mg/dL   Creatinine, Ser 4.09 (H) 0.61 - 1.24 mg/dL   Calcium 8.9 8.9 - 81.1 mg/dL   GFR, Estimated 48 (L) >60 mL/min   Anion gap 10 5 - 15  CBC per protocol   Collection Time: 06/18/23  2:00 PM  Result Value Ref Range   WBC 5.6 4.0 - 10.5 K/uL   RBC 4.50 4.22 - 5.81 MIL/uL   Hemoglobin 13.9 13.0 - 17.0 g/dL   HCT 91.4 78.2 - 95.6 %   MCV 94.9 80.0 - 100.0 fL   MCH 30.9 26.0 - 34.0 pg   MCHC 32.6 30.0 - 36.0 g/dL   RDW 21.3 08.6 - 57.8 %    Platelets 155 150 - 400 K/uL   nRBC 0.0 0.0 - 0.2 %    ECHOCARDIOGRAM COMPLETE Result Date: 06/18/2023    ECHOCARDIOGRAM REPORT   Patient Name:   Bruce [name] Date of Exam: 06/18/2023 Medical Rec #:  [id number]     Height:       65.5 in Accession #:    [id number]    Weight:       185.0 lb Date of Birth:  12/30/1935    BSA:          1.925 m Patient Age:    88 years      BP:           133/76 mmHg Patient Gender: M             HR:           61 bpm. Exam Location:  [address] Procedure: 2D Echo, 3D Echo, Cardiac Doppler and Color Doppler Indications:    R07.9 Chest Pain  History:        Patient has prior history of Echocardiogram examinations, most                 recent 05/15/2021. CAD and Previous Myocardial Infarction,  Arrythmias:Bradycardia, Signs/Symptoms:Chest Pain and Fatigue;                 Risk Factors:Hypertension, Dyslipidemia, Sleep Apnea and Family                 History of Coronary Artery Disease. Chronic Kidney Disease.  Sonographer:    Farrel Conners RDCS Referring Phys: Ames Coupe DICK IMPRESSIONS  1. Left ventricular ejection fraction, by estimation, is 55 to 60%. The left ventricle has normal function. The left ventricle has no regional wall motion abnormalities. Left ventricular diastolic parameters are consistent with Grade I diastolic dysfunction (impaired relaxation).  2. Right ventricular systolic function is normal. The right ventricular size is normal. There is normal pulmonary artery systolic pressure. The estimated right ventricular systolic pressure is 28.6 mmHg.  3. Left atrial size was mildly dilated.  4. The mitral valve is normal in structure. Trivial mitral valve regurgitation. No evidence of mitral stenosis.  5. The aortic valve is normal in structure. Aortic valve regurgitation is not visualized. No aortic stenosis is present.  6. Aortic dilatation noted. There is borderline dilatation of the ascending aorta, measuring 38 mm.  7. The inferior vena cava is  normal in size with greater than 50% respiratory variability, suggesting right atrial pressure of 3 mmHg. FINDINGS  Left Ventricle: Left ventricular ejection fraction, by estimation, is 55 to 60%. The left ventricle has normal function. The left ventricle has no regional wall motion abnormalities. The left ventricular internal cavity size was normal in size. There is  no left ventricular hypertrophy. Left ventricular diastolic parameters are consistent with Grade I diastolic dysfunction (impaired relaxation). Right Ventricle: The right ventricular size is normal. No increase in right ventricular wall thickness. Right ventricular systolic function is normal. There is normal pulmonary artery systolic pressure. The tricuspid regurgitant velocity is 2.53 m/s, and  with an assumed right atrial pressure of 3 mmHg, the estimated right ventricular systolic pressure is 28.6 mmHg. Left Atrium: Left atrial size was mildly dilated. Right Atrium: Right atrial size was normal in size. Pericardium: There is no evidence of pericardial effusion. Mitral Valve: The mitral valve is normal in structure. Trivial mitral valve regurgitation. No evidence of mitral valve stenosis. Tricuspid Valve: The tricuspid valve is normal in structure. Tricuspid valve regurgitation is trivial. No evidence of tricuspid stenosis. Aortic Valve: The aortic valve is normal in structure. Aortic valve regurgitation is not visualized. No aortic stenosis is present. Pulmonic Valve: The pulmonic valve was normal in structure. Pulmonic valve regurgitation is not visualized. No evidence of pulmonic stenosis. Aorta: Aortic dilatation noted. There is borderline dilatation of the ascending aorta, measuring 38 mm. Venous: The inferior vena cava is normal in size with greater than 50% respiratory variability, suggesting right atrial pressure of 3 mmHg. IAS/Shunts: No atrial level shunt detected by color flow Doppler.  LEFT VENTRICLE PLAX 2D LVIDd:         5.40 cm    Diastology LVIDs:         4.05 cm   LV e' medial:    6.85 cm/s LV PW:         0.80 cm   LV E/e' medial:  10.1 LV IVS:        0.85 cm   LV e' lateral:   10.90 cm/s LVOT diam:     2.40 cm   LV E/e' lateral: 6.3 LV SV:         87 LV SV Index:   45 LVOT Area:  4.52 cm                           3D Volume EF:                          3D EF:        67 %                          LV EDV:       134 ml                          LV ESV:       44 ml                          LV SV:        89 ml RIGHT VENTRICLE RV Basal diam:  4.50 cm RV Mid diam:    3.40 cm RV S prime:     15.10 cm/s TAPSE (M-mode): 2.8 cm RVSP:           28.6 mmHg LEFT ATRIUM             Index        RIGHT ATRIUM           Index LA diam:        4.75 cm 2.47 cm/m   RA Pressure: 3.00 mmHg LA Vol (A2C):   65.0 ml 33.77 ml/m  RA Area:     22.70 cm LA Vol (A4C):   66.8 ml 34.71 ml/m  RA Volume:   71.45 ml  37.13 ml/m LA Biplane Vol: 66.1 ml 34.35 ml/m  AORTIC VALVE LVOT Vmax:   88.55 cm/s LVOT Vmean:  56.250 cm/s LVOT VTI:    0.192 m  AORTA Ao Root diam: 4.00 cm Ao Asc diam:  3.75 cm MITRAL VALVE                TRICUSPID VALVE MV Area (PHT): cm          TR Peak grad:   25.6 mmHg MV Decel Time: 261 msec     TR Vmax:        253.00 cm/s MV E velocity: 69.20 cm/s   Estimated RAP:  3.00 mmHg MV A velocity: 114.00 cm/s  RVSP:           28.6 mmHg MV E/A ratio:  0.61                             SHUNTS                             Systemic VTI:  0.19 m                             Systemic Diam: 2.40 cm Aditya Sabharwal Electronically signed by Dorthula Nettles Signature Date/Time: 06/18/2023/11:55:41 AM    Final    MYOCARDIAL PERFUSION IMAGING Result Date: 06/07/2023   Findings are consistent with infarction. The study is low risk.   No ST deviation was noted.   LV perfusion is abnormal. There is no evidence of ischemia. There is evidence of infarction. Defect 1: There is  a medium defect with moderate reduction in uptake present in the apical to basal inferior  location(s) that is fixed. There is abnormal wall motion in the defect area. Consistent with infarction.   Left ventricular function is normal. Nuclear stress EF: 58%. The left ventricular ejection fraction is normal (55-65%). End diastolic cavity size is normal. End systolic cavity size is normal.   Prior study available for comparison from 08/12/2015. Fixed inferior perfusion defect with abnormal wall motion consistent with infarct Low risk study. No ischemia    Antibiotics:  Anti-infectives (From admission, onward)    Start     Dose/Rate Route Frequency Ordered Stop   06/21/23 0715  ceFAZolin (ANCEF) IVPB 2g/100 mL premix        2 g 200 mL/hr over 30 Minutes Intravenous On call to O.R. 06/21/23 0709 06/21/23 1055       Discharge Exam: Blood pressure 116/64, pulse (!) 55, temperature (!) 97.5 F (36.4 C), resp. rate 12, height 5\' 5"  (1.651 m), weight 81.6 kg, SpO2 92%. Neurologic: Grossly normal Vac in place  Discharge Medications:   Allergies as of 06/21/2023       Reactions   Nsaids Other (See Comments)   CKD   Prednisone Other (See Comments)   Lowers heart rate   Methyldopa Itching, Rash   Plavix [clopidogrel Bisulfate] Itching, Rash   Spironolactone Rash   Zithromax [azithromycin] Palpitations        Medication List     TAKE these medications    acetaminophen 500 MG tablet Commonly known as: TYLENOL Take 500-1,000 mg by mouth every 6 (six) hours as needed (pain.).   amLODipine 10 MG tablet Commonly known as: NORVASC Take 10 mg by mouth at bedtime.   amoxicillin 500 MG capsule Commonly known as: AMOXIL Take 2,000 mg by mouth as directed. Take 4 capsules (2000 mg) by mouth 1 hour prior to dental procedure.   aspirin EC 81 MG tablet Take 81 mg by mouth daily. Swallow whole.   atorvastatin 80 MG tablet Commonly known as: LIPITOR Take 80 mg by mouth at bedtime.   beta carotene w/minerals tablet Take 1 tablet by mouth in the morning.   doxazosin 8 MG  tablet Commonly known as: CARDURA Take 8 mg by mouth in the morning.   isosorbide mononitrate 30 MG 24 hr tablet Commonly known as: IMDUR TAKE 1/2 TABLET BY MOUTH DAILY, MAY TAKE 1/2 TABLET EXTRA DAILY ONLY AS NEEDED FOR CHEST PAIN   lisinopril 20 MG tablet Commonly known as: ZESTRIL TAKE 1 TABLET EVERY DAY   multivitamin with minerals Tabs tablet Take 1 tablet by mouth in the morning. One A Day Multivitamin   omeprazole 40 MG capsule Commonly known as: PRILOSEC Take 40 mg by mouth daily before breakfast.               Discharge Care Instructions  (From admission, onward)           Start     Ordered   06/21/23 0000  Discharge wound care:       Comments: Remove Vac in 1 week   06/21/23 1234            Disposition: home   Final Dx: wound revision  Discharge Instructions     Call MD for:  difficulty breathing, headache or visual disturbances   Complete by: As directed    Call MD for:  persistant nausea and vomiting   Complete by: As directed    Call MD for:  redness, tenderness, or signs of infection (pain, swelling, redness, odor or green/yellow discharge around incision site)   Complete by: As directed    Call MD for:  severe uncontrolled pain   Complete by: As directed    Call MD for:  temperature >100.4   Complete by: As directed    Diet - low sodium heart healthy   Complete by: As directed    Discharge wound care:   Complete by: As directed    Remove Vac in 1 week   Incentive spirometry RT   Complete by: As directed    Increase activity slowly   Complete by: As directed           Signed: Tia Alert 06/21/2023, 12:34 PM

## 2023-06-21 NOTE — H&P (Signed)
Subjective: Patient is a 88 y.o. male admitted for poorly healing wound. Onset of symptoms was a few months ago, unchanged since that time.  The pain is rated mild, and is located at the across the lower back. The pain is described as aching and occurs intermittently. The symptoms have been progressive. Symptoms are exacerbated by nothing in particular. He has been in wound care for weeks but the wound simply wont heal, no signs of infection.   Past Medical History:  Diagnosis Date   Arthritis    Atrioventricular block, Mobitz type 1, Wenckebach    Bradycardia    Coronary artery disease    ETT-Myoview EF 51%, inf infarct, no ischemia, low risk   GERD (gastroesophageal reflux disease)    History of echocardiogram    Echo 3/17: EF 55-60%, inferior HK, grade 1 diastolic dysfunctione   History of kidney stones    Hypertension    Macular degeneration    Myocardial infarction (HCC) 2000   with stent   PONV (postoperative nausea and vomiting)    Sleep apnea    cpap in use    Stage 3a chronic kidney disease (CKD) (HCC) 12/02/2021    Past Surgical History:  Procedure Laterality Date   CARDIAC CATHETERIZATION  2017   CORONARY ANGIOPLASTY     EYE SURGERY     bilateral cataract surgery with lens implant   KNEE SURGERY Right    LEFT HEART CATHETERIZATION WITH CORONARY ANGIOGRAM N/A 07/01/2011   Procedure: LEFT HEART CATHETERIZATION WITH CORONARY ANGIOGRAM;  Surgeon: Donato Schultz, MD;  Location: [facility] CATH LAB;  Service: Cardiovascular;  Laterality: N/A;   left wrist surgery     left wrist fracture   LUMBAR LAMINECTOMY/DECOMPRESSION MICRODISCECTOMY Left 01/11/2023   Procedure: Laminectomy and Foraminotomy - Lumbar Three-Four/Lumbar Four-Five, Left Microdiscectomy Lumbar Three-Four;  Surgeon: Arman Bogus, MD;  Location: [facility] OR;  Service: Neurosurgery;  Laterality: Left;   PARTIAL KNEE ARTHROPLASTY Left 08/02/2017   Procedure: LEFT UNICOMPARTMENTAL KNEE;  Surgeon: Durene Romans, MD;  Location:  [facility] ORS;  Service: Orthopedics;  Laterality: Left;   PARTIAL KNEE ARTHROPLASTY Right 03/02/2019   Procedure: UNICOMPARTMENTAL KNEE Medially;  Surgeon: Durene Romans, MD;  Location: [facility] ORS;  Service: Orthopedics;  Laterality: Right;  90 mins    Prior to Admission medications   Medication Sig Start Date End Date Taking? Authorizing Provider  acetaminophen (TYLENOL) 500 MG tablet Take 500-1,000 mg by mouth every 6 (six) hours as needed (pain.).   Yes [provider]  amLODipine (NORVASC) 10 MG tablet Take 10 mg by mouth at bedtime. 04/24/20  Yes [provider]  amoxicillin (AMOXIL) 500 MG capsule Take 2,000 mg by mouth as directed. Take 4 capsules (2000 mg) by mouth 1 hour prior to dental procedure.   Yes [provider]  aspirin EC 81 MG tablet Take 81 mg by mouth daily. Swallow whole.   Yes [provider]  atorvastatin (LIPITOR) 80 MG tablet Take 80 mg by mouth at bedtime.   Yes [provider]  beta carotene w/minerals (OCUVITE) tablet Take 1 tablet by mouth in the morning.   Yes [provider]  doxazosin (CARDURA) 8 MG tablet Take 8 mg by mouth in the morning.   Yes [provider]  lisinopril (ZESTRIL) 20 MG tablet TAKE 1 TABLET EVERY DAY 04/14/23  Yes Jake Bathe, MD  Multiple Vitamin (MULTIVITAMIN WITH MINERALS) TABS tablet Take 1 tablet by mouth in the morning. One A Day Multivitamin   Yes [provider]  omeprazole (PRILOSEC) 40 MG capsule Take 40 mg by mouth daily before breakfast.   Yes [provider]  isosorbide mononitrate (IMDUR) 30 MG 24 hr tablet TAKE 1/2 TABLET BY MOUTH DAILY, MAY TAKE 1/2 TABLET EXTRA DAILY ONLY AS NEEDED FOR CHEST PAIN Patient not taking: Reported on 06/15/2023 05/18/23   Gaston Islam., NP   Allergies  Allergen Reactions   Nsaids Other (See Comments)    CKD   Prednisone Other (See Comments)    Lowers heart rate   Methyldopa Itching and Rash   Plavix [Clopidogrel  Bisulfate] Itching and Rash   Spironolactone Rash   Zithromax [Azithromycin] Palpitations    Social History   Tobacco Use   Smoking status: Never   Smokeless tobacco: Never  Substance Use Topics   Alcohol use: No    Family History  Problem Relation Age of Onset   Coronary artery disease Father    Kidney disease Sister    Hyperlipidemia Sister    Heart disease Brother    Diabetes Brother    Coronary artery disease Brother    Heart attack Sister      Review of Systems  Positive ROS: neg  All other systems have been reviewed and were otherwise negative with the exception of those mentioned in the HPI and as above.  Objective: Vital signs in last 24 hours: Temp:  [97.5 F (36.4 C)] 97.5 F (36.4 C) (02/03 0837) Pulse Rate:  [74] 74 (02/03 0837) Resp:  [18] 18 (02/03 0837) BP: (139)/(75) 139/75 (02/03 0837) SpO2:  [95 %] 95 % (02/03 0837) Weight:  [81.6 kg] 81.6 kg (02/03 0837)  General Appearance: Alert, cooperative, no distress, appears stated age Head: Normocephalic, without obvious abnormality, atraumatic Eyes: PERRL, conjunctiva/corneas clear, EOM's intact    Neck: Supple, symmetrical, trachea midline Back: Symmetric, no curvature, ROM normal, no CVA tenderness Lungs:  respirations unlabored Heart: Regular rate and rhythm Abdomen: Soft, non-tender Extremities: Extremities normal, atraumatic, no cyanosis or edema Pulses: 2+ and symmetric all extremities Skin: Skin color, texture, turgor normal, no rashes or lesions  NEUROLOGIC:   Mental status: Alert and oriented x4,  no aphasia, good attention span, fund of knowledge, and memory Motor Exam - grossly normal Sensory Exam - grossly normal Reflexes: 1+ Coordination - grossly normal Gait - grossly normal Balance - grossly normal Cranial Nerves: I: smell Not tested  II: visual acuity  OS: nl    OD: nl  II: visual fields Full to confrontation  II: pupils Equal, round, reactive to light  III,VII: ptosis None   III,IV,VI: extraocular muscles  Full ROM  V: mastication Normal  V: facial light touch sensation  Normal  V,VII: corneal reflex  Present  VII: facial muscle function - upper  Normal  VII: facial muscle function - lower Normal  VIII: hearing Not tested  IX: soft palate elevation  Normal  IX,X: gag reflex Present  XI: trapezius strength  5/5  XI: sternocleidomastoid strength 5/5  XI: neck flexion strength  5/5  XII: tongue strength  Normal    Data Review Lab Results  Component Value Date   WBC 5.6 06/18/2023   HGB 13.9 06/18/2023   HCT 42.7 06/18/2023   MCV 94.9 06/18/2023   PLT 155 06/18/2023   Lab Results  Component Value Date   NA 136 06/18/2023   K 3.9 06/18/2023   CL 102 06/18/2023   CO2 24 06/18/2023   BUN 12 06/18/2023   CREATININE 1.43 (H) 06/18/2023  GLUCOSE 126 (H) 06/18/2023   Lab Results  Component Value Date   INR 0.9 01/07/2023    Assessment/Plan:  Estimated body mass index is 29.95 kg/m as calculated from the following:   Height as of this encounter: 5\' 5"  (1.651 m).   Weight as of this encounter: 81.6 kg. Patient admitted for revision of lumbar wound. Patient has failed a reasonable attempt at conservative therapy.  I explained the condition and procedure to the patient and answered any questions.  Patient wishes to proceed with procedure as planned. Understands risks/ benefits and typical outcomes of procedure.   Tia Alert 06/21/2023 10:04 AM

## 2023-06-21 NOTE — Anesthesia Postprocedure Evaluation (Signed)
Anesthesia Post Note  Patient: Bruce [name]  Procedure(s) Performed: Irrigation and debridement of lumbar wound (Spine Lumbar)     Patient location during evaluation: PACU Anesthesia Type: General Level of consciousness: awake and alert and oriented Pain management: pain level controlled Vital Signs Assessment: post-procedure vital signs reviewed and stable Respiratory status: spontaneous breathing, nonlabored ventilation and respiratory function stable Cardiovascular status: blood pressure returned to baseline and stable Postop Assessment: no apparent nausea or vomiting Anesthetic complications: no   No notable events documented.  Last Vitals:  Vitals:   06/21/23 1200 06/21/23 1215  BP: 113/65 122/60  Pulse: (!) 55 (!) 58  Resp: 13 13  Temp:    SpO2: 94% 92%    Last Pain:  Vitals:   06/21/23 1134  PainSc: Asleep                 Tyleek Smick A.

## 2023-06-21 NOTE — Transfer of Care (Signed)
Immediate Anesthesia Transfer of Care Note  Patient: Wali R Mcquarrie  Procedure(s) Performed: Irrigation and debridement of lumbar wound (Spine Lumbar)  Patient Location: PACU  Anesthesia Type:General  Level of Consciousness: awake and alert   Airway & Oxygen Therapy: Patient Spontanous Breathing and Patient connected to face mask oxygen  Post-op Assessment: Report given to RN and Post -op Vital signs reviewed and stable  Post vital signs: Reviewed and stable  Last Vitals:  Vitals Value Taken Time  BP 92/56 06/21/23 1136  Temp    Pulse 71 06/21/23 1138  Resp 16 06/21/23 1138  SpO2 96 % 06/21/23 1138  Vitals shown include unfiled device data.  Last Pain:  Vitals:   06/21/23 0847  PainSc: 3          Complications: No notable events documented.

## 2023-06-22 ENCOUNTER — Encounter (HOSPITAL_COMMUNITY): Payer: Self-pay | Admitting: Neurological Surgery

## 2023-06-26 LAB — AEROBIC/ANAEROBIC CULTURE W GRAM STAIN (SURGICAL/DEEP WOUND): Gram Stain: NONE SEEN

## 2023-07-20 DIAGNOSIS — T8130XA Disruption of wound, unspecified, initial encounter: Secondary | ICD-10-CM | POA: Diagnosis not present

## 2023-08-04 DIAGNOSIS — G4733 Obstructive sleep apnea (adult) (pediatric): Secondary | ICD-10-CM | POA: Diagnosis not present

## 2023-08-16 ENCOUNTER — Encounter (INDEPENDENT_AMBULATORY_CARE_PROVIDER_SITE_OTHER): Payer: Medicare HMO | Admitting: Ophthalmology

## 2023-09-03 DIAGNOSIS — R69 Illness, unspecified: Secondary | ICD-10-CM | POA: Diagnosis not present

## 2023-09-30 DIAGNOSIS — N1831 Chronic kidney disease, stage 3a: Secondary | ICD-10-CM | POA: Diagnosis not present

## 2023-09-30 DIAGNOSIS — E7800 Pure hypercholesterolemia, unspecified: Secondary | ICD-10-CM | POA: Diagnosis not present

## 2023-09-30 DIAGNOSIS — L089 Local infection of the skin and subcutaneous tissue, unspecified: Secondary | ICD-10-CM | POA: Diagnosis not present

## 2023-09-30 DIAGNOSIS — G629 Polyneuropathy, unspecified: Secondary | ICD-10-CM | POA: Diagnosis not present

## 2023-09-30 DIAGNOSIS — R7303 Prediabetes: Secondary | ICD-10-CM | POA: Diagnosis not present

## 2023-09-30 DIAGNOSIS — I10 Essential (primary) hypertension: Secondary | ICD-10-CM | POA: Diagnosis not present

## 2023-09-30 DIAGNOSIS — I700 Atherosclerosis of aorta: Secondary | ICD-10-CM | POA: Diagnosis not present

## 2023-10-08 DIAGNOSIS — L089 Local infection of the skin and subcutaneous tissue, unspecified: Secondary | ICD-10-CM | POA: Diagnosis not present

## 2023-10-08 DIAGNOSIS — Z6828 Body mass index (BMI) 28.0-28.9, adult: Secondary | ICD-10-CM | POA: Diagnosis not present

## 2023-10-12 DIAGNOSIS — I10 Essential (primary) hypertension: Secondary | ICD-10-CM | POA: Diagnosis not present

## 2023-10-12 DIAGNOSIS — I25119 Atherosclerotic heart disease of native coronary artery with unspecified angina pectoris: Secondary | ICD-10-CM | POA: Diagnosis not present

## 2023-10-12 DIAGNOSIS — J479 Bronchiectasis, uncomplicated: Secondary | ICD-10-CM | POA: Diagnosis not present

## 2023-10-12 DIAGNOSIS — N1831 Chronic kidney disease, stage 3a: Secondary | ICD-10-CM | POA: Diagnosis not present

## 2023-10-14 ENCOUNTER — Encounter (INDEPENDENT_AMBULATORY_CARE_PROVIDER_SITE_OTHER): Admitting: Ophthalmology

## 2023-10-14 DIAGNOSIS — H43812 Vitreous degeneration, left eye: Secondary | ICD-10-CM

## 2023-10-14 DIAGNOSIS — H35033 Hypertensive retinopathy, bilateral: Secondary | ICD-10-CM

## 2023-10-14 DIAGNOSIS — I10 Essential (primary) hypertension: Secondary | ICD-10-CM | POA: Diagnosis not present

## 2023-10-14 DIAGNOSIS — H353132 Nonexudative age-related macular degeneration, bilateral, intermediate dry stage: Secondary | ICD-10-CM

## 2023-10-14 DIAGNOSIS — H33302 Unspecified retinal break, left eye: Secondary | ICD-10-CM

## 2023-10-16 DIAGNOSIS — E7800 Pure hypercholesterolemia, unspecified: Secondary | ICD-10-CM | POA: Diagnosis not present

## 2023-10-16 DIAGNOSIS — I10 Essential (primary) hypertension: Secondary | ICD-10-CM | POA: Diagnosis not present

## 2023-10-16 DIAGNOSIS — N1831 Chronic kidney disease, stage 3a: Secondary | ICD-10-CM | POA: Diagnosis not present

## 2023-10-16 DIAGNOSIS — I25119 Atherosclerotic heart disease of native coronary artery with unspecified angina pectoris: Secondary | ICD-10-CM | POA: Diagnosis not present

## 2023-10-16 DIAGNOSIS — N400 Enlarged prostate without lower urinary tract symptoms: Secondary | ICD-10-CM | POA: Diagnosis not present

## 2023-10-16 DIAGNOSIS — J479 Bronchiectasis, uncomplicated: Secondary | ICD-10-CM | POA: Diagnosis not present

## 2023-11-10 DIAGNOSIS — J479 Bronchiectasis, uncomplicated: Secondary | ICD-10-CM | POA: Diagnosis not present

## 2023-11-10 DIAGNOSIS — N1831 Chronic kidney disease, stage 3a: Secondary | ICD-10-CM | POA: Diagnosis not present

## 2023-11-10 DIAGNOSIS — G4733 Obstructive sleep apnea (adult) (pediatric): Secondary | ICD-10-CM | POA: Diagnosis not present

## 2023-11-10 DIAGNOSIS — I25119 Atherosclerotic heart disease of native coronary artery with unspecified angina pectoris: Secondary | ICD-10-CM | POA: Diagnosis not present

## 2023-11-10 DIAGNOSIS — I10 Essential (primary) hypertension: Secondary | ICD-10-CM | POA: Diagnosis not present

## 2023-11-15 DIAGNOSIS — N400 Enlarged prostate without lower urinary tract symptoms: Secondary | ICD-10-CM | POA: Diagnosis not present

## 2023-11-15 DIAGNOSIS — E7800 Pure hypercholesterolemia, unspecified: Secondary | ICD-10-CM | POA: Diagnosis not present

## 2023-11-15 DIAGNOSIS — I25119 Atherosclerotic heart disease of native coronary artery with unspecified angina pectoris: Secondary | ICD-10-CM | POA: Diagnosis not present

## 2023-11-15 DIAGNOSIS — N1831 Chronic kidney disease, stage 3a: Secondary | ICD-10-CM | POA: Diagnosis not present

## 2023-11-21 ENCOUNTER — Encounter (HOSPITAL_BASED_OUTPATIENT_CLINIC_OR_DEPARTMENT_OTHER): Payer: Self-pay | Admitting: Emergency Medicine

## 2023-11-21 ENCOUNTER — Observation Stay (HOSPITAL_BASED_OUTPATIENT_CLINIC_OR_DEPARTMENT_OTHER)
Admission: EM | Admit: 2023-11-21 | Discharge: 2023-11-22 | Disposition: A | Discharge status: Home / Self Care | Attending: Internal Medicine | Admitting: Internal Medicine

## 2023-11-21 ENCOUNTER — Emergency Department (HOSPITAL_BASED_OUTPATIENT_CLINIC_OR_DEPARTMENT_OTHER)

## 2023-11-21 ENCOUNTER — Other Ambulatory Visit: Payer: Self-pay

## 2023-11-21 ENCOUNTER — Observation Stay (HOSPITAL_BASED_OUTPATIENT_CLINIC_OR_DEPARTMENT_OTHER)

## 2023-11-21 DIAGNOSIS — Z7982 Long term (current) use of aspirin: Secondary | ICD-10-CM | POA: Insufficient documentation

## 2023-11-21 DIAGNOSIS — K279 Peptic ulcer, site unspecified, unspecified as acute or chronic, without hemorrhage or perforation: Secondary | ICD-10-CM | POA: Diagnosis not present

## 2023-11-21 DIAGNOSIS — Z9889 Other specified postprocedural states: Secondary | ICD-10-CM | POA: Diagnosis not present

## 2023-11-21 DIAGNOSIS — H3530 Unspecified macular degeneration: Secondary | ICD-10-CM | POA: Diagnosis present

## 2023-11-21 DIAGNOSIS — G4731 Primary central sleep apnea: Secondary | ICD-10-CM | POA: Diagnosis not present

## 2023-11-21 DIAGNOSIS — Z8711 Personal history of peptic ulcer disease: Secondary | ICD-10-CM | POA: Diagnosis not present

## 2023-11-21 DIAGNOSIS — J479 Bronchiectasis, uncomplicated: Secondary | ICD-10-CM | POA: Insufficient documentation

## 2023-11-21 DIAGNOSIS — Z79899 Other long term (current) drug therapy: Secondary | ICD-10-CM | POA: Diagnosis not present

## 2023-11-21 DIAGNOSIS — Z955 Presence of coronary angioplasty implant and graft: Secondary | ICD-10-CM | POA: Insufficient documentation

## 2023-11-21 DIAGNOSIS — E785 Hyperlipidemia, unspecified: Secondary | ICD-10-CM

## 2023-11-21 DIAGNOSIS — R079 Chest pain, unspecified: Secondary | ICD-10-CM

## 2023-11-21 DIAGNOSIS — J40 Bronchitis, not specified as acute or chronic: Secondary | ICD-10-CM | POA: Diagnosis not present

## 2023-11-21 DIAGNOSIS — N400 Enlarged prostate without lower urinary tract symptoms: Secondary | ICD-10-CM

## 2023-11-21 DIAGNOSIS — Z8679 Personal history of other diseases of the circulatory system: Secondary | ICD-10-CM | POA: Diagnosis not present

## 2023-11-21 DIAGNOSIS — E1165 Type 2 diabetes mellitus with hyperglycemia: Secondary | ICD-10-CM | POA: Insufficient documentation

## 2023-11-21 DIAGNOSIS — K219 Gastro-esophageal reflux disease without esophagitis: Secondary | ICD-10-CM | POA: Diagnosis not present

## 2023-11-21 DIAGNOSIS — I129 Hypertensive chronic kidney disease with stage 1 through stage 4 chronic kidney disease, or unspecified chronic kidney disease: Secondary | ICD-10-CM | POA: Insufficient documentation

## 2023-11-21 DIAGNOSIS — R0789 Other chest pain: Secondary | ICD-10-CM | POA: Diagnosis not present

## 2023-11-21 DIAGNOSIS — H353190 Nonexudative age-related macular degeneration, unspecified eye, stage unspecified: Secondary | ICD-10-CM | POA: Diagnosis not present

## 2023-11-21 DIAGNOSIS — E1122 Type 2 diabetes mellitus with diabetic chronic kidney disease: Secondary | ICD-10-CM | POA: Diagnosis not present

## 2023-11-21 DIAGNOSIS — I10 Essential (primary) hypertension: Secondary | ICD-10-CM | POA: Diagnosis not present

## 2023-11-21 DIAGNOSIS — N1831 Chronic kidney disease, stage 3a: Secondary | ICD-10-CM

## 2023-11-21 DIAGNOSIS — I2510 Atherosclerotic heart disease of native coronary artery without angina pectoris: Secondary | ICD-10-CM | POA: Insufficient documentation

## 2023-11-21 DIAGNOSIS — I252 Old myocardial infarction: Secondary | ICD-10-CM | POA: Diagnosis not present

## 2023-11-21 DIAGNOSIS — R600 Localized edema: Secondary | ICD-10-CM | POA: Insufficient documentation

## 2023-11-21 LAB — CBC
HCT: 40.1 % (ref 39.0–52.0)
Hemoglobin: 13.3 g/dL (ref 13.0–17.0)
MCH: 31.3 pg (ref 26.0–34.0)
MCHC: 33.2 g/dL (ref 30.0–36.0)
MCV: 94.4 fL (ref 80.0–100.0)
Platelets: 144 K/uL — ABNORMAL LOW (ref 150–400)
RBC: 4.15 (ref 3.87–5.11)
RBC: 4.25 MIL/uL (ref 4.22–5.81)
RDW: 14.2 % (ref 11.5–15.5)
WBC: 6.9 K/uL (ref 4.0–10.5)
nRBC: 0 % (ref 0.0–0.2)

## 2023-11-21 LAB — ECHOCARDIOGRAM COMPLETE
AR max vel: 3.98 cm2
AV Peak grad: 7.2 mmHg
Ao pk vel: 1.34 m/s
Area-P 1/2: 2.73 cm2
Height: 65 in
S' Lateral: 4 cm
Weight: 2814.83 [oz_av]

## 2023-11-21 LAB — CBC AND DIFFERENTIAL
HCT: 38 — AB (ref 41–53)
Hemoglobin: 12.9 — AB (ref 13.5–17.5)
Neutrophils Absolute: 2.9
Platelets: 151 K/uL (ref 150–400)
WBC: 5.3

## 2023-11-21 LAB — BASIC METABOLIC PANEL WITH GFR
Anion gap: 13 (ref 5–15)
BUN: 17 (ref 4–21)
BUN: 17 mg/dL (ref 8–23)
CO2: 22 mmol/L (ref 22–32)
CO2: 27 — AB (ref 13–22)
Calcium: 8.8 mg/dL — ABNORMAL LOW (ref 8.9–10.3)
Chloride: 105 mmol/L (ref 98–111)
Chloride: 106 (ref 99–108)
Creatinine, Ser: 1.33 mg/dL — ABNORMAL HIGH (ref 0.61–1.24)
Creatinine: 1.4 — AB (ref 0.6–1.3)
GFR, Estimated: 52 mL/min — ABNORMAL LOW (ref >60)
Glucose, Bld: 139 mg/dL — ABNORMAL HIGH (ref 70–99)
Glucose: 140
Potassium: 3.8 meq/L (ref 3.5–5.1)
Potassium: 4.2 mmol/L (ref 3.5–5.1)
Sodium: 138 (ref 137–147)
Sodium: 140 mmol/L (ref 135–145)

## 2023-11-21 LAB — HEMOGLOBIN A1C
Hemoglobin A1C: 6
Hgb A1c MFr Bld: 9.5 % — ABNORMAL HIGH (ref 4.8–5.6)
Mean Plasma Glucose: 225.95 mg/dL

## 2023-11-21 LAB — TROPONIN T, HIGH SENSITIVITY
Troponin T High Sensitivity: 23 ng/L — ABNORMAL HIGH (ref <19)
Troponin T High Sensitivity: 21 ng/L — ABNORMAL HIGH (ref <19)

## 2023-11-21 LAB — HEPATIC FUNCTION PANEL
ALT: 34 U/L (ref 10–40)
AST: 31 (ref 14–40)
Alkaline Phosphatase: 109 (ref 25–125)

## 2023-11-21 LAB — COMPREHENSIVE METABOLIC PANEL WITH GFR
Albumin: 4 (ref 3.5–5.0)
Calcium: 8.3 — AB (ref 8.7–10.7)
eGFR: 51

## 2023-11-21 LAB — MAGNESIUM: Magnesium: 2 mg/dL (ref 1.7–2.4)

## 2023-11-21 MED ORDER — ATORVASTATIN CALCIUM 80 MG PO TABS
80.0000 mg | ORAL_TABLET | Freq: Every day | ORAL | Status: DC
  Administered 2023-11-21: 80 mg via ORAL
  Filled 2023-11-21: qty 1

## 2023-11-21 MED ORDER — LISINOPRIL 20 MG PO TABS
20.0000 mg | ORAL_TABLET | Freq: Every day | ORAL | Status: DC
  Administered 2023-11-21: 20 mg via ORAL
  Filled 2023-11-21: qty 1

## 2023-11-21 MED ORDER — MORPHINE SULFATE (PF) 4 MG/ML IV SOLN
4.0000 mg | Freq: Once | INTRAVENOUS | Status: AC
  Administered 2023-11-21: 4 mg via INTRAVENOUS
  Filled 2023-11-21: qty 1

## 2023-11-21 MED ORDER — PANTOPRAZOLE SODIUM 40 MG PO TBEC
40.0000 mg | DELAYED_RELEASE_TABLET | Freq: Every day | ORAL | Status: DC
  Administered 2023-11-22: 40 mg via ORAL
  Filled 2023-11-21: qty 1

## 2023-11-21 MED ORDER — HEPARIN SODIUM (PORCINE) 5000 UNIT/ML IJ SOLN
5000.0000 [IU] | Freq: Three times a day (TID) | INTRAMUSCULAR | Status: DC
  Administered 2023-11-21 – 2023-11-22 (×3): 5000 [IU] via SUBCUTANEOUS
  Filled 2023-11-21 (×3): qty 1

## 2023-11-21 MED ORDER — ASPIRIN 81 MG PO TBEC
81.0000 mg | DELAYED_RELEASE_TABLET | Freq: Every day | ORAL | Status: DC
  Administered 2023-11-21 – 2023-11-22 (×2): 81 mg via ORAL
  Filled 2023-11-21 (×2): qty 1

## 2023-11-21 MED ORDER — ACETAMINOPHEN 325 MG PO TABS: 650.0000 mg | ORAL_TABLET | ORAL | Status: DC | PRN

## 2023-11-21 MED ORDER — ACETAMINOPHEN 325 MG PO TABS
650.0000 mg | ORAL_TABLET | Freq: Once | ORAL | Status: AC
  Administered 2023-11-21: 650 mg via ORAL
  Filled 2023-11-21: qty 2

## 2023-11-21 MED ORDER — ONDANSETRON HCL 4 MG/2ML IJ SOLN: 4.0000 mg | Freq: Four times a day (QID) | INTRAMUSCULAR | Status: DC | PRN

## 2023-11-21 NOTE — ED Provider Notes (Signed)
 [place] [facility] Provider Note   CSN: [id number] Arrival date & time: 11/21/23  9757     Patient presents with: Chest Pain   Bruce [name] is a 88 y.o. male.   Patient is an 88 year old male with past medical history of coronary artery disease with stent, hypertension, hyperlipidemia, chronic renal insufficiency.  Patient presenting today with complaints of chest pain.  This woke him from sleep at approximately 1130 this evening.  He describes an 8 out of 10 pain noted to the right side of his chest.  He denies any shortness of breath, nausea, diaphoresis, or radiation to the arm or jaw.  No leg swelling or pain.  No aggravating or alleviating factors.       Prior to Admission medications   Medication Sig Start Date End Date Taking? Authorizing Provider  acetaminophen  (TYLENOL ) 500 MG tablet Take 500-1,000 mg by mouth every 6 (six) hours as needed (pain.).    [provider]  amLODipine  (NORVASC ) 10 MG tablet Take 10 mg by mouth at bedtime. 04/24/20   [provider]  amoxicillin (AMOXIL) 500 MG capsule Take 2,000 mg by mouth as directed. Take 4 capsules (2000 mg) by mouth 1 hour prior to dental procedure.    [provider]  aspirin  EC 81 MG tablet Take 81 mg by mouth daily. Swallow whole.    [provider]  atorvastatin  (LIPITOR ) 80 MG tablet Take 80 mg by mouth at bedtime.    [provider]  beta carotene w/minerals (OCUVITE) tablet Take 1 tablet by mouth in the morning.    [provider]  doxazosin  (CARDURA ) 8 MG tablet Take 8 mg by mouth in the morning.    [provider]  isosorbide  mononitrate (IMDUR ) 30 MG 24 hr tablet TAKE 1/2 TABLET BY MOUTH DAILY, MAY TAKE 1/2 TABLET EXTRA DAILY ONLY AS NEEDED FOR CHEST PAIN Patient not taking: Reported on 06/15/2023 05/18/23   Wyn Jackee VEAR Mickey., NP  lisinopril  (ZESTRIL ) 20 MG tablet TAKE 1 TABLET EVERY DAY 04/14/23   Jeffrie Oneil BROCKS, MD   Multiple Vitamin (MULTIVITAMIN WITH MINERALS) TABS tablet Take 1 tablet by mouth in the morning. One A Day Multivitamin    [provider]  omeprazole  (PRILOSEC) 40 MG capsule Take 40 mg by mouth daily before breakfast.    [provider]    Allergies: Nsaids, Prednisone , Methyldopa, Plavix [clopidogrel bisulfate], Spironolactone, and Zithromax [azithromycin]    Review of Systems  All other systems reviewed and are negative.   Updated Vital Signs BP (!) 146/83 (BP Location: Right Arm)   Pulse 79   Temp 98.2 F (36.8 C) (Oral)   Resp 20   Ht 5' 5 (1.651 m)   Wt 79.4 kg   SpO2 96%   BMI 29.12 kg/m   Physical Exam Vitals and nursing note reviewed.  Constitutional:      General: He is not in acute distress.    Appearance: He is well-developed. He is not diaphoretic.  HENT:     Head: Normocephalic and atraumatic.  Cardiovascular:     Rate and Rhythm: Normal rate and regular rhythm.     Heart sounds: No murmur heard.    No friction rub.  Pulmonary:     Effort: Pulmonary effort is normal. No respiratory distress.     Breath sounds: Normal breath sounds. No wheezing or rales.  Abdominal:     General: Bowel sounds are normal. There is no distension.  Palpations: Abdomen is soft.     Tenderness: There is no abdominal tenderness.  Musculoskeletal:        General: Normal range of motion.     Cervical back: Normal range of motion and neck supple.     Right lower leg: No tenderness. No edema.     Left lower leg: No tenderness. No edema.  Skin:    General: Skin is warm and dry.  Neurological:     Mental Status: He is alert and oriented to person, place, and time.     Coordination: Coordination normal.     (all labs ordered are listed, but only abnormal results are displayed) Labs Reviewed  CBC - Abnormal; Notable for the following components:      Result Value   Platelets 144 (*)    All other components within normal limits  BASIC METABOLIC PANEL  WITH GFR  TROPONIN T, HIGH SENSITIVITY    EKG: EKG Interpretation Date/Time:  Sunday November 21 2023 02:53:17 EDT Ventricular Rate:  76 PR Interval:  210 QRS Duration:  111 QT Interval:  388 QTC Calculation: 437 R Axis:   12  Text Interpretation: Sinus rhythm Ventricular premature complex Inferior infarct, age indeterminate No significant change since 05/13/2023 Confirmed by Geroldine Berg ([id number]) on 11/21/2023 3:02:37 AM  Radiology: No results found.   Procedures   Medications Ordered in the ED  morphine  (PF) 4 MG/ML injection 4 mg (has no administration in time range)                                    Medical Decision Making Amount and/or Complexity of Data Reviewed Labs: ordered. Radiology: ordered.  Risk Prescription drug management. Decision regarding hospitalization.   Patient is a an 88 year old male presenting with complaints of chest pain.  He has history of coronary artery disease with stent in the past.  Patient arrives here today with stable vital signs and is afebrile.  Physical examination basically unremarkable.  Laboratory studies obtained including CBC, metabolic panel, and troponin.  He has a troponin of 23, followed up with second troponin of 21.  Laboratory studies otherwise unremarkable.  Chest x-ray showing no acute process.  Patient has received morphine  for pain and seems to be feeling improved.  Given his prior cardiac history, I feel as though admission for observation is indicated.  I have spoken with Dr. Alfornia who agrees to admit.     Final diagnoses:  None    ED Discharge Orders     None          Geroldine Berg, MD 11/21/23 [phone]

## 2023-11-21 NOTE — ED Notes (Signed)
 Patient c/o mild, generalized headache; MD made aware.

## 2023-11-21 NOTE — ED Notes (Signed)
 Patient asleep at this time, remains on cardiac monitor. Respirations even and unlabored

## 2023-11-21 NOTE — Plan of Care (Signed)
 Plan of Care Note for accepted transfer   Patient name: Bruce [name] FMW:[id number] DOB: 1936-04-19  Facility requesting transfer: [facility] ED Requesting Provider: Dr. Geroldine Facility course: 88 year old male with history of CAD s/p PCI, Mobitz type I AV block, hypertension, CKD stage IIIa, sleep apnea presented with acute onset substernal/right-sided chest pain tonight.  Vital signs stable on arrival.  EKG without acute ischemic changes.  Troponin 23> 21.  Chest x-ray showing cardiac enlargement with probable early interstitial edema in the lung bases but no focal consolidation.  Showing chronic bronchitic changes in the lungs.  Patient was given morphine .  Plan of care: The patient is accepted for admission to Telemetry unit at [facility].  [facility] will assume care on arrival to accepting facility. Until arrival, care as per EDP. However, [facility] available 24/7 for questions and assistance.  Check [email or web address] for on-call coverage.  Nursing staff, please call [facility] Admits & Consults System-Wide number under Amion on patient's arrival so appropriate admitting provider can evaluate the pt.

## 2023-11-21 NOTE — ED Notes (Signed)
 ED TO INPATIENT HANDOFF REPORT  ED Nurse Name and Phone #: Venetia CROME, RN   S Name/Age/Gender Bruce [name] 88 y.o. male Room/Bed: MH01/MH01  Code Status   Code Status: Prior  Home/SNF/Other Home Patient oriented to: self, place, time, and situation Is this baseline? Yes   Triage Complete: Triage complete  Chief Complaint Chest pain [R07.9]  Triage Note CP that woke him from sleep around 2330. Pt pinpoints pain to R chest. Reports personal hx of 2 MIs in the past, last one around 2013. Also has cardiac stents. Pt did not feel pain with previous MI. Denies other sx.    Allergies Allergies  Allergen Reactions   Nsaids Other (See Comments)    CKD   Prednisone  Other (See Comments)    Lowers heart rate   Methyldopa Itching and Rash   Plavix [Clopidogrel Bisulfate] Itching and Rash   Spironolactone Rash   Zithromax [Azithromycin] Palpitations    Level of Care/Admitting Diagnosis ED Disposition     ED Disposition  Admit   Condition  --   Comment  Hospital Area: [facility] [[id number]]  Level of Care: Telemetry Cardiac [103]  Interfacility transfer: Yes  May place patient in observation at [facility] or [facility] if equivalent level of care is available:: Yes  Covid Evaluation: Asymptomatic - no recent exposure (last 10 days) testing not required  Diagnosis: Chest pain [744799]  Admitting Physician: BOBBETTE EHRICH [[id number]]  Attending Physician: GEROLDINE BERG [[id number]]          B Medical/Surgery History Past Medical History:  Diagnosis Date   Arthritis    Atrioventricular block, Mobitz type 1, Wenckebach    Bradycardia    Coronary artery disease    ETT-Myoview  EF 51%, inf infarct, no ischemia, low risk   GERD (gastroesophageal reflux disease)    History of echocardiogram    Echo 3/17: EF 55-60%, inferior HK, grade 1 diastolic dysfunctione   History of kidney stones    Hypertension    Macular degeneration    Myocardial infarction  (HCC) 2000   with stent   PONV (postoperative nausea and vomiting)    Sleep apnea    cpap in use    Stage 3a chronic kidney disease (CKD) (HCC) 12/02/2021   Past Surgical History:  Procedure Laterality Date   CARDIAC CATHETERIZATION  2017   CORONARY ANGIOPLASTY     EYE SURGERY     bilateral cataract surgery with lens implant   KNEE SURGERY Right    LEFT HEART CATHETERIZATION WITH CORONARY ANGIOGRAM N/A 07/01/2011   Procedure: LEFT HEART CATHETERIZATION WITH CORONARY ANGIOGRAM;  Surgeon: Oneil Parchment, MD;  Location: [facility] CATH LAB;  Service: Cardiovascular;  Laterality: N/A;   left wrist surgery     left wrist fracture   LUMBAR LAMINECTOMY/DECOMPRESSION MICRODISCECTOMY Left 01/11/2023   Procedure: Laminectomy and Foraminotomy - Lumbar Three-Four/Lumbar Four-Five, Left Microdiscectomy Lumbar Three-Four;  Surgeon: Joshua Alm Hamilton, MD;  Location: [facility] OR;  Service: Neurosurgery;  Laterality: Left;   LUMBAR WOUND DEBRIDEMENT N/A 06/21/2023   Procedure: Irrigation and debridement of lumbar wound;  Surgeon: Joshua Alm Hamilton, MD;  Location: [facility] OR;  Service: Neurosurgery;  Laterality: N/A;   PARTIAL KNEE ARTHROPLASTY Left 08/02/2017   Procedure: LEFT UNICOMPARTMENTAL KNEE;  Surgeon: Ernie Cough, MD;  Location: [facility] ORS;  Service: Orthopedics;  Laterality: Left;   PARTIAL KNEE ARTHROPLASTY Right 03/02/2019   Procedure: UNICOMPARTMENTAL KNEE Medially;  Surgeon: Ernie Cough, MD;  Location: [facility] ORS;  Service: Orthopedics;  Laterality:  Right;  90 mins     A IV Location/Drains/Wounds Patient Lines/Drains/Airways Status     Active Line/Drains/Airways     Name Placement date Placement time Site Days   Peripheral IV 11/21/23 18 G 1.16 Right Antecubital 11/21/23  0301  Antecubital  less than 1            Intake/Output Last 24 hours No intake or output data in the 24 hours ending 11/21/23 0646  Labs/Imaging Results for orders placed or performed during the hospital encounter of 11/21/23  (from the past 48 hours)  Basic metabolic panel     Status: Abnormal   Collection Time: 11/21/23  3:00 AM  Result Value Ref Range   Sodium 140 135 - 145 mmol/L   Potassium 4.2 3.5 - 5.1 mmol/L    Comment: HEMOLYSIS AT THIS LEVEL MAY AFFECT RESULT   Chloride 105 98 - 111 mmol/L   CO2 22 22 - 32 mmol/L   Glucose, Bld 139 (H) 70 - 99 mg/dL    Comment: Glucose reference range applies only to samples taken after fasting for at least 8 hours.   BUN 17 8 - 23 mg/dL   Creatinine, Ser 8.66 (H) 0.61 - 1.24 mg/dL   Calcium  8.8 (L) 8.9 - 10.3 mg/dL   GFR, Estimated 52 (L) >60 mL/min    Comment: (NOTE) Calculated using the CKD-EPI Creatinine Equation (2021)    Anion gap 13 5 - 15    Comment: Performed at [facility], [address] [place], [place] [address]  CBC     Status: Abnormal   Collection Time: 11/21/23  3:00 AM  Result Value Ref Range   WBC 6.9 4.0 - 10.5 K/uL   RBC 4.25 4.22 - 5.81 MIL/uL   Hemoglobin 13.3 13.0 - 17.0 g/dL   HCT 59.8 60.9 - 47.9 %   MCV 94.4 80.0 - 100.0 fL   MCH 31.3 26.0 - 34.0 pg   MCHC 33.2 30.0 - 36.0 g/dL   RDW 85.7 88.4 - 84.4 %   Platelets 144 (L) 150 - 400 K/uL   nRBC 0.0 0.0 - 0.2 %    Comment: Performed at [facility], [address] [place], [place] [address]  Troponin T, High Sensitivity     Status: Abnormal   Collection Time: 11/21/23  3:00 AM  Result Value Ref Range   Troponin T High Sensitivity 23 (H) <19 ng/L    Comment: (NOTE) Biotin concentrations > 1000 ng/mL falsely decrease TnT results.  Serial cardiac troponin measurements are suggested.  Refer to the Links section for chest pain algorithms and additional  guidance. Performed at [facility], [address] [place], [place] [address]   Troponin T, High Sensitivity     Status: Abnormal   Collection Time: 11/21/23  4:44 AM  Result Value Ref Range   Troponin T High Sensitivity 21 (H) <19 ng/L    Comment: (NOTE) Biotin concentrations > 1000  ng/mL falsely decrease TnT results.  Serial cardiac troponin measurements are suggested.  Refer to the Links section for chest pain algorithms and additional  guidance. Performed at [facility], [address] [place], [place] [address]    DG Chest Portable 1 View Result Date: 11/21/2023 CLINICAL DATA:  Chest pain. Previous history of myocardial infarcts. EXAM: PORTABLE CHEST 1 VIEW COMPARISON:  05/13/2023 FINDINGS: Cardiac enlargement. Mild vascular congestion. Interstitial changes in the lung bases may represent early edema. No  focal consolidation. No pleural effusion or pneumothorax. Mild bronchiectasis with peribronchial thickening suggesting chronic bronchitis. Calcified and tortuous aorta. Degenerative changes in the spine and shoulders. IMPRESSION: 1. Cardiac enlargement with probable early interstitial edema in the lung bases. No focal consolidation. 2. Chronic bronchitic changes in the lungs. Electronically Signed   By: Elsie Gravely M.D.   On: 11/21/2023 03:21    Pending Labs Unresulted Labs (From admission, onward)    None       Vitals/Pain Today's Vitals   11/21/23 0530 11/21/23 0545 11/21/23 0600 11/21/23 0602  BP: 117/64 115/65 124/66   Pulse: (!) 57 (!) 54 (!) 57   Resp: 17 17 17    Temp:      TempSrc:      SpO2: 93% 94% 98%   Weight:      Height:      PainSc:    0-No pain    Isolation Precautions No active isolations  Medications Medications  morphine  (PF) 4 MG/ML injection 4 mg (4 mg Intravenous Given 11/21/23 0325)    Mobility walks     Focused Assessments Cardiac Assessment Handoff:  Cardiac Rhythm: Sinus bradycardia Lab Results  Component Value Date   CKTOTAL 263 (H) 12/01/2010   CKMB 5.0 (H) 12/01/2010   TROPONINI <0.30 12/01/2010   No results found for: DDIMER Does the Patient currently have chest pain? No    R Recommendations: See Admitting Provider Note  Report given to:   Additional Notes: 87yoM A/ox4,  ambulatory, presented with R sided CP that manifested ~2330 last night while asleep. Hx CAD. Previous stents placed in RCA 2002. Initially received 4mg  Morphine  and has been pain free since. Trops 23 -> 21.

## 2023-11-21 NOTE — Progress Notes (Signed)
 Echocardiogram 2D Echocardiogram has been performed.  Bruce [name] 11/21/2023, 4:04 PM

## 2023-11-21 NOTE — H&P (Signed)
 History and Physical   TATEN MERROW FMW:[id number] DOB: 09/02/1935 DOA: 11/21/2023  PCP: Marvene Prentice [name], FNP   Patient coming from: Home   Chief Complaint: Chest pain  HPI: Bruce [name] is a 88 y.o. male with medical history significant of hypertension, hyperlipidemia, CAD status post stent, CKD 3B, PUD, BPH, bronchiolectasis, central sleep apnea, macular degeneration, low back pain, status post splenectomy presenting with chest pain.  Presented to the ED overnight after episode of significant chest pain woke him from sleep around 11:30 PM last night.  Pain was initially reported as an 8 out of 10 right-sided chest pain that was nonradiating.  No aggravating or alleviating factors.  No nausea, vomiting, diaphoresis.  Additionally reports he was prescribed Imdur  when he was seen by cardiology in January but took this inconsistently and is no longer taking it.  Patient and family report he has had some increased lower extremity edema in the last 6 months but this still tends to resolve completely overnight with his legs up.  Denies fever, chills, abdominal pain, constipation, diarrhea  ED Course: Vital signs in the ED notable for blood pressure in the 100s-140 systolic, heart rate in the 50s-70s.  Lab workup included BMP with creatinine stable 1.33, glucose 139, calcium  8.8.  CBC with platelets 144.  Troponin flat at 23, 21.  Chest x-ray showed cardiomegaly and probable interstitial edema but no evidence of consolidation.  Also noted was chronic bronchitic changes.  Patient received Tylenol  and morphine  in the ED.  Review of Systems: As per HPI otherwise all other systems reviewed and are negative.  Past Medical History:  Diagnosis Date   Arthritis    Atrioventricular block, Mobitz type 1, Wenckebach    Bradycardia    Coronary artery disease    ETT-Myoview  EF 51%, inf infarct, no ischemia, low risk   GERD (gastroesophageal reflux disease)    History of echocardiogram    Echo 3/17: EF  55-60%, inferior HK, grade 1 diastolic dysfunctione   History of kidney stones    Hypertension    Macular degeneration    Myocardial infarction (HCC) 2000   with stent   PONV (postoperative nausea and vomiting)    Sleep apnea    cpap in use    Stage 3a chronic kidney disease (CKD) (HCC) 12/02/2021    Past Surgical History:  Procedure Laterality Date   CARDIAC CATHETERIZATION  2017   CORONARY ANGIOPLASTY     EYE SURGERY     bilateral cataract surgery with lens implant   KNEE SURGERY Right    LEFT HEART CATHETERIZATION WITH CORONARY ANGIOGRAM N/A 07/01/2011   Procedure: LEFT HEART CATHETERIZATION WITH CORONARY ANGIOGRAM;  Surgeon: Oneil Parchment, MD;  Location: [facility] CATH LAB;  Service: Cardiovascular;  Laterality: N/A;   left wrist surgery     left wrist fracture   LUMBAR LAMINECTOMY/DECOMPRESSION MICRODISCECTOMY Left 01/11/2023   Procedure: Laminectomy and Foraminotomy - Lumbar Three-Four/Lumbar Four-Five, Left Microdiscectomy Lumbar Three-Four;  Surgeon: Joshua Alm Hamilton, MD;  Location: [facility] OR;  Service: Neurosurgery;  Laterality: Left;   LUMBAR WOUND DEBRIDEMENT N/A 06/21/2023   Procedure: Irrigation and debridement of lumbar wound;  Surgeon: Joshua Alm Hamilton, MD;  Location: [facility] OR;  Service: Neurosurgery;  Laterality: N/A;   PARTIAL KNEE ARTHROPLASTY Left 08/02/2017   Procedure: LEFT UNICOMPARTMENTAL KNEE;  Surgeon: Ernie Cough, MD;  Location: [facility] ORS;  Service: Orthopedics;  Laterality: Left;   PARTIAL KNEE ARTHROPLASTY Right 03/02/2019   Procedure: UNICOMPARTMENTAL KNEE Medially;  Surgeon: Ernie Cough, MD;  Location: [facility] ORS;  Service: Orthopedics;  Laterality: Right;  90 mins    Social History  reports that he has never smoked. He has never used smokeless tobacco. He reports that he does not drink alcohol and does not use drugs.  Allergies  Allergen Reactions   Nsaids Other (See Comments)    CKD   Prednisone  Other (See Comments)    Lowers heart rate   Methyldopa Itching  and Rash   Plavix [Clopidogrel Bisulfate] Itching and Rash   Spironolactone Rash   Zithromax [Azithromycin] Palpitations    Family History  Problem Relation Age of Onset   Coronary artery disease Father    Kidney disease Sister    Hyperlipidemia Sister    Heart disease Brother    Diabetes Brother    Coronary artery disease Brother    Heart attack Sister   Reviewed on admission  Prior to Admission medications   Medication Sig Start Date End Date Taking? Authorizing Provider  acetaminophen  (TYLENOL ) 500 MG tablet Take 500-1,000 mg by mouth every 6 (six) hours as needed (pain.).   Yes [provider]  amLODipine  (NORVASC ) 10 MG tablet Take 10 mg by mouth at bedtime. 04/24/20  Yes [provider]  aspirin  EC 81 MG tablet Take 81 mg by mouth daily. Swallow whole.   Yes [provider]  atorvastatin  (LIPITOR ) 80 MG tablet Take 80 mg by mouth at bedtime.   Yes [provider]  beta carotene w/minerals (OCUVITE) tablet Take 1 tablet by mouth in the morning.   Yes [provider]  doxazosin  (CARDURA ) 8 MG tablet Take 8 mg by mouth in the morning.   Yes [provider]  lisinopril  (ZESTRIL ) 20 MG tablet TAKE 1 TABLET EVERY DAY 04/14/23  Yes Jeffrie Oneil BROCKS, MD  Multiple Vitamin (MULTIVITAMIN WITH MINERALS) TABS tablet Take 1 tablet by mouth in the morning. One A Day Multivitamin   Yes [provider]  omeprazole  (PRILOSEC) 40 MG capsule Take 40 mg by mouth daily before breakfast.   Yes [provider]  amoxicillin (AMOXIL) 500 MG capsule Take 2,000 mg by mouth as directed. Take 4 capsules (2000 mg) by mouth 1 hour prior to dental procedure.    [provider]  isosorbide  mononitrate (IMDUR ) 30 MG 24 hr tablet TAKE 1/2 TABLET BY MOUTH DAILY, MAY TAKE 1/2 TABLET EXTRA DAILY ONLY AS NEEDED FOR CHEST PAIN Patient not taking: Reported on 06/15/2023 05/18/23   Wyn Jackee VEAR Mickey., NP    Physical Exam: Vitals:   11/21/23  1108 11/21/23 1215 11/21/23 1218 11/21/23 1337  BP: 139/77  129/68 137/68  Pulse: (!) 57 (!) 57 (!) 56 (!) 58  Resp: 14 19 16 18   Temp:   (!) 97.4 F (36.3 C) 98.5 F (36.9 C)  TempSrc:   Oral Oral  SpO2: 98% 95% 96% 98%  Weight:    79.8 kg  Height:    5' 5 (1.651 m)    Physical Exam Constitutional:      General: He is not in acute distress.    Appearance: Normal appearance.  HENT:     Head: Normocephalic and atraumatic.     Mouth/Throat:     Mouth: Mucous membranes are moist.     Pharynx: Oropharynx is clear.  Eyes:     Extraocular Movements: Extraocular movements intact.     Pupils: Pupils are equal, round, and reactive to light.  Cardiovascular:     Rate and Rhythm: Regular rhythm. Bradycardia present.  Pulses: Normal pulses.     Heart sounds: Normal heart sounds.  Pulmonary:     Effort: Pulmonary effort is normal. No respiratory distress.     Breath sounds: Normal breath sounds.  Abdominal:     General: Bowel sounds are normal. There is no distension.     Palpations: Abdomen is soft.     Tenderness: There is no abdominal tenderness.  Musculoskeletal:        General: No swelling or deformity.  Skin:    General: Skin is warm and dry.  Neurological:     General: No focal deficit present.     Mental Status: Mental status is at baseline.    Labs on Admission: I have personally reviewed following labs and imaging studies  CBC: Recent Labs  Lab 11/21/23 0300  WBC 6.9  HGB 13.3  HCT 40.1  MCV 94.4  PLT 144*    Basic Metabolic Panel: Recent Labs  Lab 11/21/23 0300  NA 140  K 4.2  CL 105  CO2 22  GLUCOSE 139*  BUN 17  CREATININE 1.33*  CALCIUM  8.8*    GFR: Estimated Creatinine Clearance: 39.5 mL/min (A) (by C-G formula based on SCr of 1.33 mg/dL (H)).  Liver Function Tests: No results for input(s): AST, ALT, ALKPHOS, BILITOT, PROT, ALBUMIN  in the last 168 hours.  Urine analysis: No results found for: COLORURINE,  APPEARANCEUR, LABSPEC, PHURINE, GLUCOSEU, HGBUR, BILIRUBINUR, KETONESUR, PROTEINUR, UROBILINOGEN, NITRITE, LEUKOCYTESUR  Radiological Exams on Admission: DG Chest Portable 1 View Result Date: 11/21/2023 CLINICAL DATA:  Chest pain. Previous history of myocardial infarcts. EXAM: PORTABLE CHEST 1 VIEW COMPARISON:  05/13/2023 FINDINGS: Cardiac enlargement. Mild vascular congestion. Interstitial changes in the lung bases may represent early edema. No focal consolidation. No pleural effusion or pneumothorax. Mild bronchiectasis with peribronchial thickening suggesting chronic bronchitis. Calcified and tortuous aorta. Degenerative changes in the spine and shoulders. IMPRESSION: 1. Cardiac enlargement with probable early interstitial edema in the lung bases. No focal consolidation. 2. Chronic bronchitic changes in the lungs. Electronically Signed   By: Elsie Gravely M.D.   On: 11/21/2023 03:21   EKG: Independently reviewed.  Sinus rhythm at 76 bpm.  Nonspecific T wave changes.  Minimal baseline wander.  PVC noted.  Nonspecific intraventricular conduction delay with QRS 111.  Assessment/Plan Principal Problem:   Chest pain Active Problems:   Dyslipidemia   Peptic ulcer disease   Essential hypertension, benign   Stage 3a chronic kidney disease (CKD) (HCC)   S/P lumbar laminectomy   Age-related macular degeneration   Benign prostatic hyperplasia   Gastroesophageal reflux disease   Primary central sleep apnea   Chest pain, rule out ACS CAD > Patient presenting with severe, 8 out of 10, chest pain that was right-sided, nonradiating.  No associated symptoms.   > Additionally reported some increased edema in the last 6 months.  And, only briefly took Imdur  after evaluation earlier this year. > History of CAD with prior stent as well as history of G1 DD but not currently on a diuretic.  Last A1c showed prediabetes 7 months ago at 6.0. > Last lipid panel was a year ago.  Is on  atorvastatin . > Last seen by cardiology in January at that time had an echocardiogram ordered which showed EF 55-60%, G1 DD, normal RV function.  Also had Myoview  ordered which was low risk and showed only 1 fixed defect. - Monitor on telemetry overnight - Echocardiogram - Recheck lipid panel, A1c - Continue home ASA, lisinopril , atorvastatin  - Discussed  with cardiology by phone, will start with echocardiogram, given troponins are already negative plan would be for close outpatient follow-up with cardiology which they will help expedite. Re-consult if significant abnormality on echocardiogram or other clinical changes while monitoring overnight.  Bradycardia > Chronic issue.  50s now.  Reportedly dropped into the 40s at times overnight but is asymptomatic. - Continue to monitor  Hyperlipidemia - Continue home atorvastatin   CKD 3A - Creatinine stable in the ED - Trend renal function and electrolytes  History of PUD - Continue home PPI  BPH - Continue home doxazosin   Central sleep apnea - Continue home CPAP  History of bronchiolectasis,  laminectomy,  low back pain,  macular degeneration - Noted  DVT prophylaxis: Heparin  Code Status:   Full Family Communication:  Updated at bedside  Disposition Plan:   Patient is from:  Home  Anticipated DC to:  Home  Anticipated DC date:  1 to 2 days  Anticipated DC barriers: None  Consults called:        Discussed with cardiology by phone, no formal consult requested.  Admission status:  Observation, telemetry  Severity of Illness: The appropriate patient status for this patient is OBSERVATION. Observation status is judged to be reasonable and necessary in order to provide the required intensity of service to ensure the patient's safety. The patient's presenting symptoms, physical exam findings, and initial radiographic and laboratory data in the context of their medical condition is felt to place them at decreased risk for further  clinical deterioration. Furthermore, it is anticipated that the patient will be medically stable for discharge from the hospital within 2 midnights of admission.    Marsa KATHEE Scurry MD [facility]  How to contact the [facility] Attending or Consulting provider 7A - 7P or covering provider during after hours 7P -7A, for this patient?   Check the care team in [facility] and look for a) attending/consulting [facility] provider listed and b) the [facility] team listed Log into [email or web address] and use [place]'s universal password to access. If you do not have the password, please contact the hospital operator. Locate the [facility] provider you are looking for under [facility] and page to a number that you can be directly reached. If you still have difficulty reaching the provider, please page the [facility] (Director on Call) for the Hospitalists listed on amion for assistance.  11/21/2023, 2:06 PM

## 2023-11-21 NOTE — ED Notes (Signed)
 Attempted to call report, per Harlene at [facility], patient's bed has been pulled and the unit has been capped for tonight.  Charge nurse made aware.  [facility] currently on site and holding.

## 2023-11-21 NOTE — ED Triage Notes (Signed)
 CP that woke him from sleep around 2330. Pt pinpoints pain to R chest. Reports personal hx of 2 MIs in the past, last one around 2013. Also has cardiac stents. Pt did not feel pain with previous MI. Denies other sx.

## 2023-11-22 ENCOUNTER — Telehealth: Payer: Self-pay | Admitting: *Deleted

## 2023-11-22 ENCOUNTER — Other Ambulatory Visit (HOSPITAL_COMMUNITY): Payer: Self-pay

## 2023-11-22 ENCOUNTER — Other Ambulatory Visit: Payer: Self-pay | Admitting: Cardiology

## 2023-11-22 DIAGNOSIS — Z955 Presence of coronary angioplasty implant and graft: Secondary | ICD-10-CM

## 2023-11-22 DIAGNOSIS — R079 Chest pain, unspecified: Secondary | ICD-10-CM | POA: Diagnosis not present

## 2023-11-22 DIAGNOSIS — R072 Precordial pain: Secondary | ICD-10-CM

## 2023-11-22 DIAGNOSIS — I10 Essential (primary) hypertension: Secondary | ICD-10-CM | POA: Diagnosis not present

## 2023-11-22 DIAGNOSIS — E785 Hyperlipidemia, unspecified: Secondary | ICD-10-CM | POA: Diagnosis not present

## 2023-11-22 DIAGNOSIS — I25118 Atherosclerotic heart disease of native coronary artery with other forms of angina pectoris: Secondary | ICD-10-CM | POA: Diagnosis not present

## 2023-11-22 DIAGNOSIS — E1165 Type 2 diabetes mellitus with hyperglycemia: Secondary | ICD-10-CM | POA: Insufficient documentation

## 2023-11-22 LAB — LIPID PANEL
Cholesterol: 98 mg/dL (ref 0–200)
HDL: 31 mg/dL — ABNORMAL LOW (ref >40)
LDL Cholesterol: 53 mg/dL (ref 0–99)
Total CHOL/HDL Ratio: 3.2 ratio
Triglycerides: 72 mg/dL (ref <150)
VLDL: 14 mg/dL (ref 0–40)

## 2023-11-22 LAB — COMPREHENSIVE METABOLIC PANEL WITH GFR
ALT: 20 U/L (ref 0–44)
AST: 22 U/L (ref 15–41)
Albumin: 3.3 g/dL — ABNORMAL LOW (ref 3.5–5.0)
Alkaline Phosphatase: 79 U/L (ref 38–126)
Anion gap: 8 (ref 5–15)
BUN: 13 mg/dL (ref 8–23)
CO2: 25 mmol/L (ref 22–32)
Calcium: 8.4 mg/dL — ABNORMAL LOW (ref 8.9–10.3)
Chloride: 108 mmol/L (ref 98–111)
Creatinine, Ser: 1.3 mg/dL — ABNORMAL HIGH (ref 0.61–1.24)
GFR, Estimated: 54 mL/min — ABNORMAL LOW (ref >60)
Glucose, Bld: 99 mg/dL (ref 70–99)
Potassium: 4.1 mmol/L (ref 3.5–5.1)
Sodium: 141 mmol/L (ref 135–145)
Total Bilirubin: 0.8 mg/dL (ref 0.0–1.2)
Total Protein: 5.6 g/dL — ABNORMAL LOW (ref 6.5–8.1)

## 2023-11-22 LAB — CBC
HCT: 37.9 % — ABNORMAL LOW (ref 39.0–52.0)
Hemoglobin: 12.3 g/dL — ABNORMAL LOW (ref 13.0–17.0)
MCH: 30.9 pg (ref 26.0–34.0)
MCHC: 32.5 g/dL (ref 30.0–36.0)
MCV: 95.2 fL (ref 80.0–100.0)
Platelets: 151 K/uL (ref 150–400)
RBC: 3.98 MIL/uL — ABNORMAL LOW (ref 4.22–5.81)
RDW: 14 % (ref 11.5–15.5)
WBC: 5.7 K/uL (ref 4.0–10.5)
nRBC: 0 % (ref 0.0–0.2)

## 2023-11-22 LAB — VITAMIN B12: Vitamin B-12: 207 pg/mL (ref 180–914)

## 2023-11-22 LAB — BRAIN NATRIURETIC PEPTIDE: B Natriuretic Peptide: 216.9 pg/mL — ABNORMAL HIGH (ref 0.0–100.0)

## 2023-11-22 MED ORDER — FUROSEMIDE 10 MG/ML IJ SOLN
40.0000 mg | Freq: Once | INTRAMUSCULAR | Status: AC
  Administered 2023-11-22: 40 mg via INTRAVENOUS
  Filled 2023-11-22: qty 4

## 2023-11-22 MED ORDER — ISOSORBIDE MONONITRATE ER 30 MG PO TB24
30.0000 mg | ORAL_TABLET | Freq: Every day | ORAL | Status: DC
  Administered 2023-11-22: 30 mg via ORAL
  Filled 2023-11-22: qty 1

## 2023-11-22 MED ORDER — METFORMIN HCL ER 500 MG PO TB24
500.0000 mg | ORAL_TABLET | Freq: Every day | ORAL | 1 refills | Status: AC
  Filled 2023-11-22: qty 30, 30d supply, fill #0

## 2023-11-22 MED ORDER — ISOSORBIDE MONONITRATE ER 30 MG PO TB24
30.0000 mg | ORAL_TABLET | Freq: Every day | ORAL | 1 refills | Status: DC
  Filled 2023-11-22: qty 30, 30d supply, fill #0

## 2023-11-22 NOTE — Discharge Summary (Signed)
 Physician Discharge Summary  Bruce [name] FMW:[id number] DOB: 07-Apr-1936 DOA: 11/21/2023  PCP: Bruce Prentice [name], FNP  Admit date: 11/21/2023 Discharge date: 11/22/2023  Time spent: 45 minutes  Recommendations for Outpatient Follow-up:  Outpatient cardiac PET scan Follow-up with [facility] has an appointment next week PCP in 1 week, diagnosed with new diabetes   Discharge Diagnoses:  Principal Problem:   Chest pain, rule out acute myocardial infarction Active Problems:   Dyslipidemia   Peptic ulcer disease   Essential hypertension, benign   Stage 3a chronic kidney disease (CKD) (HCC)   S/P lumbar laminectomy   Age-related macular degeneration   Benign prostatic hyperplasia   Gastroesophageal reflux disease   Primary central sleep apnea   Uncontrolled type 2 diabetes mellitus with hyperglycemia, without long-term current use of insulin (HCC)   Discharge Condition: improved  Diet recommendation: Diabetic, heart healthy  Filed Weights   11/21/23 0252 11/21/23 1337  Weight: 79.4 kg 79.8 kg    History of present illness:  87/M with history of CAD, prior PCI and DES to RCA in 2002, moderate LAD disease, hypertension, diastolic dysfunction, OSA, CKD 3, renal cell carcinoma presented to the ED with chest pain that started yesterday.  Described as pressure-like and sharp, persisted for a few hours and then resolved after morphine  and nitro in the ER.  Also had some intermittent lower extremity edema in the last few months. - In the ER, vital signs stable, creatinine 1.3, troponin 23, 21, chest x-ray with cardiomegaly in part probable interstitial edema  Hospital Course:   Chest pain Known CAD -Remote history of RCA stent, and moderate LAD disease - Myoview  1/25: Low risk - Troponin largely negative, 2D echo unremarkable - Seen by Cardiology   Dyslipidemia Continue statin   CKD 3 A Stable, monitor   History of PUD Continue PPI   BPH Continue doxazosin     OSA Continue CPAP   Hyperglycemia, uncontrolled DM2 -new diagnosis -HbA1c is 9.5, started metformin , will need carb mod diet  Discharge Exam: Vitals:   11/22/23 0350 11/22/23 0730  BP: 130/63 (!) 140/70  Pulse: (!) 53 (!) 50  Resp: 18 17  Temp: 98.1 F (36.7 C) 97.7 F (36.5 C)  SpO2: 98% 96%   Gen: Awake, Alert, Oriented X 3,  HEENT: no JVD Lungs: Good air movement bilaterally, CTAB CVS: S1S2/RRR Abd: soft, Non tender, non distended, BS present Extremities: No edema Skin: no new rashes on exposed skin   Discharge Instructions   Discharge Instructions     Diet - low sodium heart healthy   Complete by: As directed    Diet Carb Modified   Complete by: As directed    Increase activity slowly   Complete by: As directed       Allergies as of 11/22/2023       Reactions   Nsaids Other (See Comments)   CKD   Prednisone  Other (See Comments)   Lowers heart rate   Methyldopa Itching, Rash   Plavix [clopidogrel Bisulfate] Itching, Rash   Spironolactone Rash   Zithromax [azithromycin] Palpitations        Medication List     TAKE these medications    acetaminophen  500 MG tablet Commonly known as: TYLENOL  Take 500-1,000 mg by mouth every 6 (six) hours as needed (pain.).   amLODipine  10 MG tablet Commonly known as: NORVASC  Take 10 mg by mouth at bedtime.   amoxicillin 500 MG capsule Commonly known as: AMOXIL Take 2,000 mg by mouth  as directed. Take 4 capsules (2000 mg) by mouth 1 hour prior to dental procedure.   aspirin  EC 81 MG tablet Take 81 mg by mouth daily. Swallow whole.   atorvastatin  80 MG tablet Commonly known as: LIPITOR  Take 80 mg by mouth at bedtime.   beta carotene w/minerals tablet Take 1 tablet by mouth in the morning.   doxazosin  8 MG tablet Commonly known as: CARDURA  Take 8 mg by mouth in the morning.   isosorbide  mononitrate 30 MG 24 hr tablet Commonly known as: IMDUR  Take 1 tablet (30 mg total) by mouth daily. TAKE 1/2 TABLET  BY MOUTH DAILY, MAY TAKE 1/2 TABLET EXTRA DAILY ONLY AS NEEDED FOR CHEST PAIN What changed:  how much to take how to take this when to take this   lisinopril  20 MG tablet Commonly known as: ZESTRIL  TAKE 1 TABLET EVERY DAY   metformin  500 MG (OSM) 24 hr tablet Commonly known as: FORTAMET  Take 1 tablet (500 mg total) by mouth daily with breakfast.   multivitamin with minerals Tabs tablet Take 1 tablet by mouth in the morning. One A Day Multivitamin   omeprazole  40 MG capsule Commonly known as: PRILOSEC Take 40 mg by mouth daily before breakfast.       Allergies  Allergen Reactions   Nsaids Other (See Comments)    CKD   Prednisone  Other (See Comments)    Lowers heart rate   Methyldopa Itching and Rash   Plavix [Clopidogrel Bisulfate] Itching and Rash   Spironolactone Rash   Zithromax [Azithromycin] Palpitations      The results of significant diagnostics from this hospitalization (including imaging, microbiology, ancillary and laboratory) are listed below for reference.    Significant Diagnostic Studies: ECHOCARDIOGRAM COMPLETE Result Date: 11/21/2023    ECHOCARDIOGRAM REPORT   Patient Name:   Bruce [name] Date of Exam: 11/21/2023 Medical Rec #:  [id number]     Height:       65.0 in Accession #:    [id number]    Weight:       175.9 lb Date of Birth:  1935/07/30    BSA:          1.873 m Patient Age:    87 years      BP:           137/68 mmHg Patient Gender: M             HR:           58 bpm. Exam Location:  Inpatient Procedure: 2D Echo, Cardiac Doppler and Color Doppler (Both Spectral and Color            Flow Doppler were utilized during procedure). Indications:    Chest Pain R07.9  History:        Patient has prior history of Echocardiogram examinations, most                 recent 06/18/2023. CKD, stage 3, Signs/Symptoms:Chest Pain; Risk                 Factors:Hypertension and Dyslipidemia.  Sonographer:    Bruce [name] RCS Referring Phys: MELVIN, ALEXANDER, B IMPRESSIONS   1. Left ventricular ejection fraction, by estimation, is 55 to 60%. The left ventricle has normal function. The left ventricle has no regional wall motion abnormalities. Left ventricular diastolic parameters were normal.  2. Right ventricular systolic function is normal. The right ventricular size is normal.  3. The mitral valve is normal in structure. Trivial  mitral valve regurgitation. No evidence of mitral stenosis.  4. The aortic valve is tricuspid. Aortic valve regurgitation is not visualized. Aortic valve sclerosis/calcification is present, without any evidence of aortic stenosis. FINDINGS  Left Ventricle: Left ventricular ejection fraction, by estimation, is 55 to 60%. The left ventricle has normal function. The left ventricle has no regional wall motion abnormalities. The left ventricular internal cavity size was normal in size. There is  no left ventricular hypertrophy. Left ventricular diastolic parameters were normal. Right Ventricle: The right ventricular size is normal. No increase in right ventricular wall thickness. Right ventricular systolic function is normal. Left Atrium: Left atrial size was normal in size. Right Atrium: Right atrial size was normal in size. Pericardium: There is no evidence of pericardial effusion. Mitral Valve: The mitral valve is normal in structure. Trivial mitral valve regurgitation. No evidence of mitral valve stenosis. Tricuspid Valve: The tricuspid valve is normal in structure. Tricuspid valve regurgitation is trivial. Aortic Valve: The aortic valve is tricuspid. Aortic valve regurgitation is not visualized. Aortic valve sclerosis/calcification is present, without any evidence of aortic stenosis. Aortic valve peak gradient measures 7.2 mmHg. Pulmonic Valve: The pulmonic valve was not well visualized. Pulmonic valve regurgitation is not visualized. Aorta: The aortic root and ascending aorta are structurally normal, with no evidence of dilitation. IAS/Shunts: The interatrial  septum was not well visualized.  LEFT VENTRICLE PLAX 2D LVIDd:         5.50 cm   Diastology LVIDs:         4.00 cm   LV e' medial:    7.94 cm/s LV PW:         0.80 cm   LV E/e' medial:  8.7 LV IVS:        0.90 cm   LV e' lateral:   11.30 cm/s LVOT diam:     2.40 cm   LV E/e' lateral: 6.1 LV SV:         109 LV SV Index:   58 LVOT Area:     4.52 cm  RIGHT VENTRICLE RV S prime:     13.80 cm/s TAPSE (M-mode): 3.0 cm LEFT ATRIUM             Index        RIGHT ATRIUM           Index LA diam:        3.80 cm 2.03 cm/m   RA Area:     13.20 cm LA Vol (A2C):   29.0 ml 15.48 ml/m  RA Volume:   20.60 ml  11.00 ml/m LA Vol (A4C):   30.8 ml 16.44 ml/m LA Biplane Vol: 31.7 ml 16.92 ml/m  AORTIC VALVE AV Area (Vmax): 3.98 cm AV Vmax:        134.00 cm/s AV Peak Grad:   7.2 mmHg LVOT Vmax:      118.00 cm/s LVOT Vmean:     75.900 cm/s LVOT VTI:       0.242 m  AORTA Ao Root diam: 3.70 cm Ao Asc diam:  3.50 cm MITRAL VALVE                TRICUSPID VALVE MV Area (PHT): 2.73 cm     TR Peak grad:   21.2 mmHg MV Decel Time: 278 msec     TR Vmax:        230.00 cm/s MV E velocity: 69.20 cm/s MV A velocity: 107.00 cm/s  SHUNTS MV E/A ratio:  0.65  Systemic VTI:  0.24 m                             Systemic Diam: 2.40 cm Lonni Nanas MD Electronically signed by Lonni Nanas MD Signature Date/Time: 11/21/2023/7:25:24 PM    Final    DG Chest Portable 1 View Result Date: 11/21/2023 CLINICAL DATA:  Chest pain. Previous history of myocardial infarcts. EXAM: PORTABLE CHEST 1 VIEW COMPARISON:  05/13/2023 FINDINGS: Cardiac enlargement. Mild vascular congestion. Interstitial changes in the lung bases may represent early edema. No focal consolidation. No pleural effusion or pneumothorax. Mild bronchiectasis with peribronchial thickening suggesting chronic bronchitis. Calcified and tortuous aorta. Degenerative changes in the spine and shoulders. IMPRESSION: 1. Cardiac enlargement with probable early interstitial edema in the  lung bases. No focal consolidation. 2. Chronic bronchitic changes in the lungs. Electronically Signed   By: Elsie Gravely M.D.   On: 11/21/2023 03:21    Microbiology: No results found for this or any previous visit (from the past 240 hours).   Labs: Basic Metabolic Panel: Recent Labs  Lab 11/21/23 0300 11/21/23 1650 11/22/23 0244  NA 140  --  141  K 4.2  --  4.1  CL 105  --  108  CO2 22  --  25  GLUCOSE 139*  --  99  BUN 17  --  13  CREATININE 1.33*  --  1.30*  CALCIUM  8.8*  --  8.4*  MG  --  2.0  --    Liver Function Tests: Recent Labs  Lab 11/22/23 0244  AST 22  ALT 20  ALKPHOS 79  BILITOT 0.8  PROT 5.6*  ALBUMIN  3.3*   No results for input(s): LIPASE, AMYLASE in the last 168 hours. No results for input(s): AMMONIA in the last 168 hours. CBC: Recent Labs  Lab 11/21/23 0300 11/22/23 0244  WBC 6.9 5.7  HGB 13.3 12.3*  HCT 40.1 37.9*  MCV 94.4 95.2  PLT 144* 151   Cardiac Enzymes: No results for input(s): CKTOTAL, CKMB, CKMBINDEX, TROPONINI in the last 168 hours. BNP: BNP (last 3 results) No results for input(s): BNP in the last 8760 hours.  ProBNP (last 3 results) No results for input(s): PROBNP in the last 8760 hours.  CBG: No results for input(s): GLUCAP in the last 168 hours.     Signed:  Sigurd Pac MD.  [facility] 11/22/2023, 1:57 PM

## 2023-11-22 NOTE — Care Management Obs Status (Signed)
 MEDICARE OBSERVATION STATUS NOTIFICATION   Patient Details  Name: Bruce [name] MRN: [id number] Date of Birth: 1936/01/01   Medicare Observation Status Notification Given:  Yes    Waddell Barnie Rama, RN 11/22/2023, 11:58 AM

## 2023-11-22 NOTE — Consult Note (Signed)
 Cardiology Consultation   Patient ID: CEASAR DECANDIA MRN: [id number]; DOB: 02-06-1936  Admit date: 11/21/2023 Date of Consult: 11/22/2023  PCP:  Marvene Prentice [name], FNP   [facility] Providers Cardiologist:  Oneil Parchment, MD        Patient Profile: Bruce [name] is a 88 y.o. male with a hx of CAD status post myocardial infarction with PCI and drug-eluting stent to RCA, hypertension, hyperlipidemia, obstructive sleep apnea on CPAP, chronic kidney disease stage IIIa, renal cell carcinoma, macular degeneration, prior splenectomy who is being seen 11/22/2023 for the evaluation of chest pain at the request of Dr. Fairy.  History of Present Illness: [name] presented to the emergency department on 7/6 after an episode of significant chest pain that woke him from sleep.  Pain rated 8 out of 10 in severity, right-sided and nonradiating.  No other associated acute symptoms.   Patient has significant cardiac history with prior myocardial infarction in 2002 with PCI to RCA.  At that time also with moderate LAD stenosis.  2013 left heart catheterization showed patent stents.  2019 stress test showed no ischemia.  Patient was last seen in cardiology office on 05/18/2023.  This visit was arranged following patient presenting to the emergency department at the end of 2024 with chest pain.  During this ED visit, troponin not found to be elevated and his EKG did not reveal acute ischemia.  Patient was started on 15 mg of Imdur  prior to arranging outpatient follow-up.  At his office follow-up on 12/31, it was noted that the patient had ongoing chest pain despite new Imdur  prescription.  Imdur  was continued at a 15 mg dose, but patient was advised that he can take an additional 15 mg as needed for breakthrough chest pain.  Nuclear stress test was arranged to further evaluate his cardiac perfusion.  This stress test was deemed overall low risk with no evidence of ischemia.  There was evidence of infarction  with moderate reduction in uptake present in apical to basal inferior locations.  This was felt to be consistent with patient's prior infarction.  Since the stress test, no subsequent follow-up with cardiology.  Per patient, he is no longer taking Imdur .  In the emergency department, basic metabolic panel unremarkable with stable creatinine, appropriate electrolytes.  Troponin checked x 2 with results of 23, 21.  Initial plan was for admission for observation and an echocardiogram with outpatient follow-up if echocardiogram normal.  Patient had his echocardiogram completed yesterday, found with preserved LVEF of 55 to 60% and no regional wall motion abnormalities.  No significant valvular abnormalities noted.  Although echocardiogram reassuring, cardiology consulted today given CAD history and ongoing symptoms.  On exam today, patient and spouse confirm above acute onset chest pressure that woke patient from sleep. He says that this was similar to what he felt this past December when he also came to the ED. Denies any recurrence of chest pain between December and yesterday. Denies focal dyspnea though has noted progressive fatigue with activity over the last year. Has not had chest pain with physical activity. Denies orthopnea. Reports some LE edema on days he is up on his feet more, resolves by morning.  Past Medical History:  Diagnosis Date   Arthritis    Atrioventricular block, Mobitz type 1, Wenckebach    Bradycardia    Coronary artery disease    ETT-Myoview  EF 51%, inf infarct, no ischemia, low risk   GERD (gastroesophageal reflux disease)    History of  echocardiogram    Echo 3/17: EF 55-60%, inferior HK, grade 1 diastolic dysfunctione   History of kidney stones    Hypertension    Macular degeneration    Myocardial infarction (HCC) 2000   with stent   PONV (postoperative nausea and vomiting)    Sleep apnea    cpap in use    Stage 3a chronic kidney disease (CKD) (HCC) 12/02/2021     Past Surgical History:  Procedure Laterality Date   CARDIAC CATHETERIZATION  2017   CORONARY ANGIOPLASTY     EYE SURGERY     bilateral cataract surgery with lens implant   KNEE SURGERY Right    LEFT HEART CATHETERIZATION WITH CORONARY ANGIOGRAM N/A 07/01/2011   Procedure: LEFT HEART CATHETERIZATION WITH CORONARY ANGIOGRAM;  Surgeon: Oneil Parchment, MD;  Location: [facility] CATH LAB;  Service: Cardiovascular;  Laterality: N/A;   left wrist surgery     left wrist fracture   LUMBAR LAMINECTOMY/DECOMPRESSION MICRODISCECTOMY Left 01/11/2023   Procedure: Laminectomy and Foraminotomy - Lumbar Three-Four/Lumbar Four-Five, Left Microdiscectomy Lumbar Three-Four;  Surgeon: Joshua Alm Hamilton, MD;  Location: [facility] OR;  Service: Neurosurgery;  Laterality: Left;   LUMBAR WOUND DEBRIDEMENT N/A 06/21/2023   Procedure: Irrigation and debridement of lumbar wound;  Surgeon: Joshua Alm Hamilton, MD;  Location: [facility] OR;  Service: Neurosurgery;  Laterality: N/A;   PARTIAL KNEE ARTHROPLASTY Left 08/02/2017   Procedure: LEFT UNICOMPARTMENTAL KNEE;  Surgeon: Ernie Cough, MD;  Location: [facility] ORS;  Service: Orthopedics;  Laterality: Left;   PARTIAL KNEE ARTHROPLASTY Right 03/02/2019   Procedure: UNICOMPARTMENTAL KNEE Medially;  Surgeon: Ernie Cough, MD;  Location: [facility] ORS;  Service: Orthopedics;  Laterality: Right;  90 mins     Home Medications:  Prior to Admission medications   Medication Sig Start Date End Date Taking? Authorizing Provider  acetaminophen  (TYLENOL ) 500 MG tablet Take 500-1,000 mg by mouth every 6 (six) hours as needed (pain.).   Yes [provider]  amLODipine  (NORVASC ) 10 MG tablet Take 10 mg by mouth at bedtime. 04/24/20  Yes [provider]  aspirin  EC 81 MG tablet Take 81 mg by mouth daily. Swallow whole.   Yes [provider]  atorvastatin  (LIPITOR ) 80 MG tablet Take 80 mg by mouth at bedtime.   Yes [provider]  beta carotene w/minerals (OCUVITE) tablet Take 1  tablet by mouth in the morning.   Yes [provider]  doxazosin  (CARDURA ) 8 MG tablet Take 8 mg by mouth in the morning.   Yes [provider]  lisinopril  (ZESTRIL ) 20 MG tablet TAKE 1 TABLET EVERY DAY 04/14/23  Yes Parchment Oneil BROCKS, MD  Multiple Vitamin (MULTIVITAMIN WITH MINERALS) TABS tablet Take 1 tablet by mouth in the morning. One A Day Multivitamin   Yes [provider]  omeprazole  (PRILOSEC) 40 MG capsule Take 40 mg by mouth daily before breakfast.   Yes [provider]  amoxicillin (AMOXIL) 500 MG capsule Take 2,000 mg by mouth as directed. Take 4 capsules (2000 mg) by mouth 1 hour prior to dental procedure.    [provider]  isosorbide  mononitrate (IMDUR ) 30 MG 24 hr tablet TAKE 1/2 TABLET BY MOUTH DAILY, MAY TAKE 1/2 TABLET EXTRA DAILY ONLY AS NEEDED FOR CHEST PAIN Patient not taking: Reported on 06/15/2023 05/18/23   Wyn Jackee VEAR Mickey., NP    Scheduled Meds:  aspirin  EC  81 mg Oral Daily   atorvastatin   80 mg Oral QHS   furosemide   40 mg Intravenous Once  heparin   5,000 Units Subcutaneous Q8H   pantoprazole   40 mg Oral Daily   Continuous Infusions:  PRN Meds: acetaminophen , ondansetron  (ZOFRAN ) IV  Allergies:    Allergies  Allergen Reactions   Nsaids Other (See Comments)    CKD   Prednisone  Other (See Comments)    Lowers heart rate   Methyldopa Itching and Rash   Plavix [Clopidogrel Bisulfate] Itching and Rash   Spironolactone Rash   Zithromax [Azithromycin] Palpitations    Social History:   Social History   Socioeconomic History   Marital status: Married    Spouse name: Not on file   Number of children: 2   Years of education: Not on file   Highest education level: Not on file  Occupational History   Not on file  Tobacco Use   Smoking status: Never   Smokeless tobacco: Never  Vaping Use   Vaping status: Never Used  Substance and Sexual Activity   Alcohol use: No   Drug use: No   Sexual activity: Yes   Other Topics Concern   Not on file  Social History Narrative   Not on file   Social Drivers of Health   Financial Resource Strain: Not on file  Food Insecurity: No Food Insecurity (11/22/2023)   Hunger Vital Sign    Worried About Running Out of Food in the Last Year: Never true    Ran Out of Food in the Last Year: Never true  Transportation Needs: No Transportation Needs (11/22/2023)   PRAPARE - Administrator, Civil Service (Medical): No    Lack of Transportation (Non-Medical): No  Physical Activity: Not on file  Stress: Not on file  Social Connections: Socially Isolated (11/22/2023)   Social Connection and Isolation Panel    Frequency of Communication with Friends and Family: Never    Frequency of Social Gatherings with Friends and Family: Never    Attends Religious Services: Never    Database administrator or Organizations: No    Attends Banker Meetings: Never    Marital Status: Married  Catering manager Violence: Not At Risk (11/22/2023)   Humiliation, Afraid, Rape, and Kick questionnaire    Fear of Current or Ex-Partner: No    Emotionally Abused: No    Physically Abused: No    Sexually Abused: No    Family History:    Family History  Problem Relation Age of Onset   Coronary artery disease Father    Kidney disease Sister    Hyperlipidemia Sister    Heart disease Brother    Diabetes Brother    Coronary artery disease Brother    Heart attack Sister      ROS:  Please see the history of present illness.   All other ROS reviewed and negative.     Physical Exam/Data: Vitals:   11/21/23 2306 11/22/23 0006 11/22/23 0350 11/22/23 0730  BP: 123/62 132/71 130/63 (!) 140/70  Pulse:  (!) 53 (!) 53 (!) 50  Resp: 19 19 18 17   Temp: 98.2 F (36.8 C) 98.1 F (36.7 C) 98.1 F (36.7 C) 97.7 F (36.5 C)  TempSrc: Oral Oral Oral Oral  SpO2:  97% 98% 96%  Weight:      Height:        Intake/Output Summary (Last 24 hours) at 11/22/2023 1025 Last data  filed at 11/22/2023 0924 Gross per 24 hour  Intake 60 ml  Output --  Net 60 ml      11/21/2023  1:37 PM 11/21/2023    2:52 AM 06/21/2023    8:37 AM  Last 3 Weights  Weight (lbs) 175 lb 14.8 oz 175 lb 180 lb  Weight (kg) 79.8 kg 79.379 kg 81.647 kg     Body mass index is 29.28 kg/m.  General:  Well nourished, well developed, in no acute distress HEENT: normal Neck: no JVD Vascular: No carotid bruits; Distal pulses 2+ bilaterally Cardiac:  normal S1, S2; RRR; no murmur  Lungs:  clear to auscultation bilaterally, no wheezing, rhonchi or rales  Abd: soft, nontender, no hepatomegaly  Ext: trace LE edema in bilateral LE with faint sockline visible Musculoskeletal:  No deformities, BUE and BLE strength normal and equal Skin: warm and dry  Neuro:  CNs 2-12 intact, no focal abnormalities noted Psych:  Normal affect   EKG:  The EKG was personally reviewed and demonstrates:  sinus rhythm with chronic appear inferior lead Q waves Telemetry:  Telemetry was personally reviewed and demonstrates:  sinus rhythm, sinus bradycardia with first degree AVB.  Relevant CV Studies:  11/22/23 TTE  IMPRESSIONS     1. Left ventricular ejection fraction, by estimation, is 55 to 60%. The  left ventricle has normal function. The left ventricle has no regional  wall motion abnormalities. Left ventricular diastolic parameters were  normal.   2. Right ventricular systolic function is normal. The right ventricular  size is normal.   3. The mitral valve is normal in structure. Trivial mitral valve  regurgitation. No evidence of mitral stenosis.   4. The aortic valve is tricuspid. Aortic valve regurgitation is not  visualized. Aortic valve sclerosis/calcification is present, without any  evidence of aortic stenosis.   FINDINGS   Left Ventricle: Left ventricular ejection fraction, by estimation, is 55  to 60%. The left ventricle has normal function. The left ventricle has no  regional wall motion  abnormalities. The left ventricular internal cavity  size was normal in size. There is   no left ventricular hypertrophy. Left ventricular diastolic parameters  were normal.   Right Ventricle: The right ventricular size is normal. No increase in  right ventricular wall thickness. Right ventricular systolic function is  normal.   Left Atrium: Left atrial size was normal in size.   Right Atrium: Right atrial size was normal in size.   Pericardium: There is no evidence of pericardial effusion.   Mitral Valve: The mitral valve is normal in structure. Trivial mitral  valve regurgitation. No evidence of mitral valve stenosis.   Tricuspid Valve: The tricuspid valve is normal in structure. Tricuspid  valve regurgitation is trivial.   Aortic Valve: The aortic valve is tricuspid. Aortic valve regurgitation is  not visualized. Aortic valve sclerosis/calcification is present, without  any evidence of aortic stenosis. Aortic valve peak gradient measures 7.2  mmHg.   Pulmonic Valve: The pulmonic valve was not well visualized. Pulmonic valve  regurgitation is not visualized.   Aorta: The aortic root and ascending aorta are structurally normal, with  no evidence of dilitation.   IAS/Shunts: The interatrial septum was not well visualized.   Laboratory Data: High Sensitivity Troponin:  No results for input(s): TROPONINIHS in the last 720 hours.   Chemistry Recent Labs  Lab 11/21/23 0300 11/21/23 1650 11/22/23 0244  NA 140  --  141  K 4.2  --  4.1  CL 105  --  108  CO2 22  --  25  GLUCOSE 139*  --  99  BUN 17  --  13  CREATININE 1.33*  --  1.30*  CALCIUM  8.8*  --  8.4*  MG  --  2.0  --   GFRNONAA 52*  --  54*  ANIONGAP 13  --  8    Recent Labs  Lab 11/22/23 0244  PROT 5.6*  ALBUMIN  3.3*  AST 22  ALT 20  ALKPHOS 79  BILITOT 0.8   Lipids  Recent Labs  Lab 11/22/23 0244  CHOL 98  TRIG 72  HDL 31*  LDLCALC 53  CHOLHDL 3.2    Hematology Recent Labs  Lab  11/21/23 0300 11/22/23 0244  WBC 6.9 5.7  RBC 4.25 3.98*  HGB 13.3 12.3*  HCT 40.1 37.9*  MCV 94.4 95.2  MCH 31.3 30.9  MCHC 33.2 32.5  RDW 14.2 14.0  PLT 144* 151   Thyroid  No results for input(s): TSH, FREET4 in the last 168 hours.  BNPNo results for input(s): BNP, PROBNP in the last 168 hours.  DDimer No results for input(s): DDIMER in the last 168 hours.  Radiology/Studies:  ECHOCARDIOGRAM COMPLETE Result Date: 11/21/2023    ECHOCARDIOGRAM REPORT   Patient Name:   STERLING MONDO Date of Exam: 11/21/2023 Medical Rec #:  [id number]     Height:       65.0 in Accession #:    [id number]    Weight:       175.9 lb Date of Birth:  10/26/1935    BSA:          1.873 m Patient Age:    87 years      BP:           137/68 mmHg Patient Gender: M             HR:           58 bpm. Exam Location:  Inpatient Procedure: 2D Echo, Cardiac Doppler and Color Doppler (Both Spectral and Color            Flow Doppler were utilized during procedure). Indications:    Chest Pain R07.9  History:        Patient has prior history of Echocardiogram examinations, most                 recent 06/18/2023. CKD, stage 3, Signs/Symptoms:Chest Pain; Risk                 Factors:Hypertension and Dyslipidemia.  Sonographer:    Thea Norlander RCS Referring Phys: MELVIN, ALEXANDER, B IMPRESSIONS  1. Left ventricular ejection fraction, by estimation, is 55 to 60%. The left ventricle has normal function. The left ventricle has no regional wall motion abnormalities. Left ventricular diastolic parameters were normal.  2. Right ventricular systolic function is normal. The right ventricular size is normal.  3. The mitral valve is normal in structure. Trivial mitral valve regurgitation. No evidence of mitral stenosis.  4. The aortic valve is tricuspid. Aortic valve regurgitation is not visualized. Aortic valve sclerosis/calcification is present, without any evidence of aortic stenosis. FINDINGS  Left Ventricle: Left ventricular ejection  fraction, by estimation, is 55 to 60%. The left ventricle has normal function. The left ventricle has no regional wall motion abnormalities. The left ventricular internal cavity size was normal in size. There is  no left ventricular hypertrophy. Left ventricular diastolic parameters were normal. Right Ventricle: The right ventricular size is normal. No increase in right ventricular wall thickness. Right ventricular systolic function is normal. Left Atrium: Left atrial size was normal in size. Right Atrium: Right atrial  size was normal in size. Pericardium: There is no evidence of pericardial effusion. Mitral Valve: The mitral valve is normal in structure. Trivial mitral valve regurgitation. No evidence of mitral valve stenosis. Tricuspid Valve: The tricuspid valve is normal in structure. Tricuspid valve regurgitation is trivial. Aortic Valve: The aortic valve is tricuspid. Aortic valve regurgitation is not visualized. Aortic valve sclerosis/calcification is present, without any evidence of aortic stenosis. Aortic valve peak gradient measures 7.2 mmHg. Pulmonic Valve: The pulmonic valve was not well visualized. Pulmonic valve regurgitation is not visualized. Aorta: The aortic root and ascending aorta are structurally normal, with no evidence of dilitation. IAS/Shunts: The interatrial septum was not well visualized.  LEFT VENTRICLE PLAX 2D LVIDd:         5.50 cm   Diastology LVIDs:         4.00 cm   LV e' medial:    7.94 cm/s LV PW:         0.80 cm   LV E/e' medial:  8.7 LV IVS:        0.90 cm   LV e' lateral:   11.30 cm/s LVOT diam:     2.40 cm   LV E/e' lateral: 6.1 LV SV:         109 LV SV Index:   58 LVOT Area:     4.52 cm  RIGHT VENTRICLE RV S prime:     13.80 cm/s TAPSE (M-mode): 3.0 cm LEFT ATRIUM             Index        RIGHT ATRIUM           Index LA diam:        3.80 cm 2.03 cm/m   RA Area:     13.20 cm LA Vol (A2C):   29.0 ml 15.48 ml/m  RA Volume:   20.60 ml  11.00 ml/m LA Vol (A4C):   30.8 ml 16.44  ml/m LA Biplane Vol: 31.7 ml 16.92 ml/m  AORTIC VALVE AV Area (Vmax): 3.98 cm AV Vmax:        134.00 cm/s AV Peak Grad:   7.2 mmHg LVOT Vmax:      118.00 cm/s LVOT Vmean:     75.900 cm/s LVOT VTI:       0.242 m  AORTA Ao Root diam: 3.70 cm Ao Asc diam:  3.50 cm MITRAL VALVE                TRICUSPID VALVE MV Area (PHT): 2.73 cm     TR Peak grad:   21.2 mmHg MV Decel Time: 278 msec     TR Vmax:        230.00 cm/s MV E velocity: 69.20 cm/s MV A velocity: 107.00 cm/s  SHUNTS MV E/A ratio:  0.65         Systemic VTI:  0.24 m                             Systemic Diam: 2.40 cm Lonni Nanas MD Electronically signed by Lonni Nanas MD Signature Date/Time: 11/21/2023/7:25:24 PM    Final    DG Chest Portable 1 View Result Date: 11/21/2023 CLINICAL DATA:  Chest pain. Previous history of myocardial infarcts. EXAM: PORTABLE CHEST 1 VIEW COMPARISON:  05/13/2023 FINDINGS: Cardiac enlargement. Mild vascular congestion. Interstitial changes in the lung bases may represent early edema. No focal consolidation. No pleural effusion or pneumothorax. Mild bronchiectasis with peribronchial  thickening suggesting chronic bronchitis. Calcified and tortuous aorta. Degenerative changes in the spine and shoulders. IMPRESSION: 1. Cardiac enlargement with probable early interstitial edema in the lung bases. No focal consolidation. 2. Chronic bronchitic changes in the lungs. Electronically Signed   By: Elsie Gravely M.D.   On: 11/21/2023 03:21     Assessment and Plan:  Coronary artery disease Chest pain Patient with history of RCA stenting now admitted in the setting of chest pain.  Recent stress test from 06/07/2023 without evidence of new ischemia, prior infarction noted on this low risk study.  01/31 echo with preserved LVEF.  This admission patient with minimally elevated and flat troponin, 23, 21.  His EKG is nonischemic.  A repeat echocardiogram completed on 7/6 is stable with January study.  Preserved LVEF with  no regional wall motion abnormalities or significant valvular dysfunction. Clinical suspicion for ACS is low.  Given reassuring workup and no recurrent symptoms, would not pursue inpatient ischemic evaluation. There may be a component of microvascular disease. Recommend restarting Imdur  at 30mg  daily dose with outpatient follow up. If patient continues to have breakthrough discomfort, would consider PET stress to assess microvascular perfusion (though with Imdur  and ongoing Amlodipine  use, minimal options to escalate therapy if MV disease found).  Continue ASA 81mg , Atorvastatin  80mg  Not a good candidate for beta blocker with lower resting HR and first degree AVB.  Hypertension BP elevated this admission with home BP meds held. Recommend resuming Amlodipine  10mg , beneficial for BP and angina. Suspect that this is likely source of his LE edema. Otherwise appears euvolemic. Imdur  30mg  as above.   Hyperlipidemia LDL at goal of 55mg /DL. Continue Atorvastatin  80mg .  Lab Results  Component Value Date   CHOL 98 11/22/2023   HDL 31 (L) 11/22/2023   LDLCALC 53 11/22/2023   TRIG 72 11/22/2023   CHOLHDL 3.2 11/22/2023    Per primary team: OSA CKD IIIA PUD  Risk Assessment/Risk Scores:              For questions or updates, please contact [facility] Please consult [email or web address] for contact info under    Signed, Artist Pouch, PA-C  11/22/2023 10:25 AM

## 2023-11-22 NOTE — Plan of Care (Signed)

## 2023-11-22 NOTE — H&P (Deleted)
 PROGRESS NOTE    Bruce [name]  FMW:[id number] DOB: 27-Oct-1935 DOA: 11/21/2023 PCP: Marvene Prentice [name], FNP  87/M with history of CAD, prior PCI and DES to RCA in 2002, moderate LAD disease, hypertension, diastolic dysfunction, OSA, CKD 3, renal cell carcinoma presented to the ED with chest pain that started yesterday.  Described as pressure-like and sharp, persisted for a few hours and then resolved after morphine  and nitro in the ER.  Also had some intermittent lower extremity edema in the last few months. - In the ER, vital signs stable, creatinine 1.3, troponin 23, 21, chest x-ray with cardiomegaly in part probable interstitial edema   Subjective: Feels better, no further chest pain, history of intermittent episodes, wife reports ongoing fatigue  Assessment and Plan:  Chest pain Known CAD -Remote history of RCA stent, and moderate LAD disease - Myoview  1/25: Low risk - Troponin largely negative, 2D echo unremarkable - Will request cardiology input considering known history of CAD and ongoing symptoms - Continue aspirin  and statin  Dyslipidemia Continue statin  CKD 3 A Stable, monitor  History of PUD Continue PPI  BPH Continue doxazosin   OSA Continue CPAP  DVT prophylaxis: Heparin  subcutaneous Code Status: Full code Family Communication: Wife at bedside Disposition Plan: Home pending cards eval  Consultants:    Procedures:   Antimicrobials:    Objective: Vitals:   11/21/23 2306 11/22/23 0006 11/22/23 0350 11/22/23 0730  BP: 123/62 132/71 130/63 (!) 140/70  Pulse:  (!) 53 (!) 53 (!) 50  Resp: 19 19 18 17   Temp: 98.2 F (36.8 C) 98.1 F (36.7 C) 98.1 F (36.7 C) 97.7 F (36.5 C)  TempSrc: Oral Oral Oral Oral  SpO2:  97% 98% 96%  Weight:      Height:        Intake/Output Summary (Last 24 hours) at 11/22/2023 1003 Last data filed at 11/22/2023 0924 Gross per 24 hour  Intake 60 ml  Output --  Net 60 ml   Filed Weights   11/21/23 0252 11/21/23 1337   Weight: 79.4 kg 79.8 kg    Examination:  General exam: Appears calm and comfortable  Respiratory system: Clear to auscultation Cardiovascular system: S1 & S2 heard, RRR.  Abd: nondistended, soft and nontender.Normal bowel sounds heard. Central nervous system: Alert and oriented. No focal neurological deficits. Extremities: no edema Skin: No rashes Psychiatry:  Mood & affect appropriate.     Data Reviewed:   CBC: Recent Labs  Lab 11/21/23 0300 11/22/23 0244  WBC 6.9 5.7  HGB 13.3 12.3*  HCT 40.1 37.9*  MCV 94.4 95.2  PLT 144* 151   Basic Metabolic Panel: Recent Labs  Lab 11/21/23 0300 11/21/23 1650 11/22/23 0244  NA 140  --  141  K 4.2  --  4.1  CL 105  --  108  CO2 22  --  25  GLUCOSE 139*  --  99  BUN 17  --  13  CREATININE 1.33*  --  1.30*  CALCIUM  8.8*  --  8.4*  MG  --  2.0  --    GFR: Estimated Creatinine Clearance: 40.4 mL/min (A) (by C-G formula based on SCr of 1.3 mg/dL (H)). Liver Function Tests: Recent Labs  Lab 11/22/23 0244  AST 22  ALT 20  ALKPHOS 79  BILITOT 0.8  PROT 5.6*  ALBUMIN  3.3*   No results for input(s): LIPASE, AMYLASE in the last 168 hours. No results for input(s): AMMONIA in the last 168 hours. Coagulation Profile: No  results for input(s): INR, PROTIME in the last 168 hours. Cardiac Enzymes: No results for input(s): CKTOTAL, CKMB, CKMBINDEX, TROPONINI in the last 168 hours. BNP (last 3 results) No results for input(s): PROBNP in the last 8760 hours. HbA1C: Recent Labs    11/21/23 1430  HGBA1C 9.5*   CBG: No results for input(s): GLUCAP in the last 168 hours. Lipid Profile: Recent Labs    11/22/23 0244  CHOL 98  HDL 31*  LDLCALC 53  TRIG 72  CHOLHDL 3.2   Thyroid  Function Tests: No results for input(s): TSH, T4TOTAL, FREET4, T3FREE, THYROIDAB in the last 72 hours. Anemia Panel: No results for input(s): VITAMINB12, FOLATE, FERRITIN, TIBC, IRON, RETICCTPCT in the  last 72 hours. Urine analysis: No results found for: COLORURINE, APPEARANCEUR, LABSPEC, PHURINE, GLUCOSEU, HGBUR, BILIRUBINUR, KETONESUR, PROTEINUR, UROBILINOGEN, NITRITE, LEUKOCYTESUR Sepsis Labs: @LABRCNTIP (procalcitonin:4,lacticidven:4)  )No results found for this or any previous visit (from the past 240 hours).   Radiology Studies: ECHOCARDIOGRAM COMPLETE Result Date: 11/21/2023    ECHOCARDIOGRAM REPORT   Patient Name:   Bruce [name] Date of Exam: 11/21/2023 Medical Rec #:  [id number]     Height:       65.0 in Accession #:    [id number]    Weight:       175.9 lb Date of Birth:  1936/01/16    BSA:          1.873 m Patient Age:    87 years      BP:           137/68 mmHg Patient Gender: M             HR:           58 bpm. Exam Location:  Inpatient Procedure: 2D Echo, Cardiac Doppler and Color Doppler (Both Spectral and Color            Flow Doppler were utilized during procedure). Indications:    Chest Pain R07.9  History:        Patient has prior history of Echocardiogram examinations, most                 recent 06/18/2023. CKD, stage 3, Signs/Symptoms:Chest Pain; Risk                 Factors:Hypertension and Dyslipidemia.  Sonographer:    Thea Norlander RCS Referring Phys: MELVIN, ALEXANDER, B IMPRESSIONS  1. Left ventricular ejection fraction, by estimation, is 55 to 60%. The left ventricle has normal function. The left ventricle has no regional wall motion abnormalities. Left ventricular diastolic parameters were normal.  2. Right ventricular systolic function is normal. The right ventricular size is normal.  3. The mitral valve is normal in structure. Trivial mitral valve regurgitation. No evidence of mitral stenosis.  4. The aortic valve is tricuspid. Aortic valve regurgitation is not visualized. Aortic valve sclerosis/calcification is present, without any evidence of aortic stenosis. FINDINGS  Left Ventricle: Left ventricular ejection fraction, by estimation, is 55 to 60%.  The left ventricle has normal function. The left ventricle has no regional wall motion abnormalities. The left ventricular internal cavity size was normal in size. There is  no left ventricular hypertrophy. Left ventricular diastolic parameters were normal. Right Ventricle: The right ventricular size is normal. No increase in right ventricular wall thickness. Right ventricular systolic function is normal. Left Atrium: Left atrial size was normal in size. Right Atrium: Right atrial size was normal in size. Pericardium: There is no evidence of pericardial effusion. Mitral  Valve: The mitral valve is normal in structure. Trivial mitral valve regurgitation. No evidence of mitral valve stenosis. Tricuspid Valve: The tricuspid valve is normal in structure. Tricuspid valve regurgitation is trivial. Aortic Valve: The aortic valve is tricuspid. Aortic valve regurgitation is not visualized. Aortic valve sclerosis/calcification is present, without any evidence of aortic stenosis. Aortic valve peak gradient measures 7.2 mmHg. Pulmonic Valve: The pulmonic valve was not well visualized. Pulmonic valve regurgitation is not visualized. Aorta: The aortic root and ascending aorta are structurally normal, with no evidence of dilitation. IAS/Shunts: The interatrial septum was not well visualized.  LEFT VENTRICLE PLAX 2D LVIDd:         5.50 cm   Diastology LVIDs:         4.00 cm   LV e' medial:    7.94 cm/s LV PW:         0.80 cm   LV E/e' medial:  8.7 LV IVS:        0.90 cm   LV e' lateral:   11.30 cm/s LVOT diam:     2.40 cm   LV E/e' lateral: 6.1 LV SV:         109 LV SV Index:   58 LVOT Area:     4.52 cm  RIGHT VENTRICLE RV S prime:     13.80 cm/s TAPSE (M-mode): 3.0 cm LEFT ATRIUM             Index        RIGHT ATRIUM           Index LA diam:        3.80 cm 2.03 cm/m   RA Area:     13.20 cm LA Vol (A2C):   29.0 ml 15.48 ml/m  RA Volume:   20.60 ml  11.00 ml/m LA Vol (A4C):   30.8 ml 16.44 ml/m LA Biplane Vol: 31.7 ml 16.92  ml/m  AORTIC VALVE AV Area (Vmax): 3.98 cm AV Vmax:        134.00 cm/s AV Peak Grad:   7.2 mmHg LVOT Vmax:      118.00 cm/s LVOT Vmean:     75.900 cm/s LVOT VTI:       0.242 m  AORTA Ao Root diam: 3.70 cm Ao Asc diam:  3.50 cm MITRAL VALVE                TRICUSPID VALVE MV Area (PHT): 2.73 cm     TR Peak grad:   21.2 mmHg MV Decel Time: 278 msec     TR Vmax:        230.00 cm/s MV E velocity: 69.20 cm/s MV A velocity: 107.00 cm/s  SHUNTS MV E/A ratio:  0.65         Systemic VTI:  0.24 m                             Systemic Diam: 2.40 cm Lonni Nanas MD Electronically signed by Lonni Nanas MD Signature Date/Time: 11/21/2023/7:25:24 PM    Final    DG Chest Portable 1 View Result Date: 11/21/2023 CLINICAL DATA:  Chest pain. Previous history of myocardial infarcts. EXAM: PORTABLE CHEST 1 VIEW COMPARISON:  05/13/2023 FINDINGS: Cardiac enlargement. Mild vascular congestion. Interstitial changes in the lung bases may represent early edema. No focal consolidation. No pleural effusion or pneumothorax. Mild bronchiectasis with peribronchial thickening suggesting chronic bronchitis. Calcified and tortuous aorta. Degenerative changes in the spine and  shoulders. IMPRESSION: 1. Cardiac enlargement with probable early interstitial edema in the lung bases. No focal consolidation. 2. Chronic bronchitic changes in the lungs. Electronically Signed   By: Elsie Gravely M.D.   On: 11/21/2023 03:21     Scheduled Meds:  aspirin  EC  81 mg Oral Daily   atorvastatin   80 mg Oral QHS   heparin   5,000 Units Subcutaneous Q8H   pantoprazole   40 mg Oral Daily   Continuous Infusions:   LOS: 0 days    Time spent:    Sigurd Pac, MD [facility]   11/22/2023, 10:03 AM

## 2023-11-22 NOTE — Progress Notes (Signed)
 PROGRESS NOTE    Bruce [name]  FMW:[id number] DOB: 1935/08/31 DOA: 11/21/2023 PCP: Marvene Prentice [name], FNP  88/M with history of CAD, prior PCI and DES to RCA in 2002, moderate LAD disease, hypertension, diastolic dysfunction, OSA, CKD 3, renal cell carcinoma presented to the ED with chest pain that started yesterday.  Described as pressure-like and sharp, persisted for a few hours and then resolved after morphine  and nitro in the ER.  Also had some intermittent lower extremity edema in the last few months. - In the ER, vital signs stable, creatinine 1.3, troponin 23, 21, chest x-ray with cardiomegaly in part probable interstitial edema   Subjective: Feels better, no further chest pain, history of intermittent episodes, wife reports ongoing fatigue  Assessment and Plan:  Chest pain Known CAD -Remote history of RCA stent, and moderate LAD disease - Myoview  1/25: Low risk - Troponin largely negative, 2D echo unremarkable - Will request cardiology input considering known history of CAD and ongoing symptoms - Continue aspirin  and statin  Dyslipidemia Continue statin  CKD 3 A Stable, monitor  History of PUD Continue PPI  BPH Continue doxazosin   OSA Continue CPAP  Hyperglycemia -FU A1c  DVT prophylaxis: Heparin  subcutaneous Code Status: Full code Family Communication: Wife at bedside Disposition Plan: Home pending cards eval  Consultants:    Procedures:   Antimicrobials:    Objective: Vitals:   11/21/23 2306 11/22/23 0006 11/22/23 0350 11/22/23 0730  BP: 123/62 132/71 130/63 (!) 140/70  Pulse:  (!) 53 (!) 53 (!) 50  Resp: 19 19 18 17   Temp: 98.2 F (36.8 C) 98.1 F (36.7 C) 98.1 F (36.7 C) 97.7 F (36.5 C)  TempSrc: Oral Oral Oral Oral  SpO2:  97% 98% 96%  Weight:      Height:        Intake/Output Summary (Last 24 hours) at 11/22/2023 1118 Last data filed at 11/22/2023 0924 Gross per 24 hour  Intake 60 ml  Output --  Net 60 ml   Filed Weights    11/21/23 0252 11/21/23 1337  Weight: 79.4 kg 79.8 kg    Examination:  General exam: Appears calm and comfortable  Respiratory system: Clear to auscultation Cardiovascular system: S1 & S2 heard, RRR.  Abd: nondistended, soft and nontender.Normal bowel sounds heard. Central nervous system: Alert and oriented. No focal neurological deficits. Extremities: no edema Skin: No rashes Psychiatry:  Mood & affect appropriate.     Data Reviewed:   CBC: Recent Labs  Lab 11/21/23 0300 11/22/23 0244  WBC 6.9 5.7  HGB 13.3 12.3*  HCT 40.1 37.9*  MCV 94.4 95.2  PLT 144* 151   Basic Metabolic Panel: Recent Labs  Lab 11/21/23 0300 11/21/23 1650 11/22/23 0244  NA 140  --  141  K 4.2  --  4.1  CL 105  --  108  CO2 22  --  25  GLUCOSE 139*  --  99  BUN 17  --  13  CREATININE 1.33*  --  1.30*  CALCIUM  8.8*  --  8.4*  MG  --  2.0  --    GFR: Estimated Creatinine Clearance: 40.4 mL/min (A) (by C-G formula based on SCr of 1.3 mg/dL (H)). Liver Function Tests: Recent Labs  Lab 11/22/23 0244  AST 22  ALT 20  ALKPHOS 79  BILITOT 0.8  PROT 5.6*  ALBUMIN  3.3*   No results for input(s): LIPASE, AMYLASE in the last 168 hours. No results for input(s): AMMONIA in the last 168  hours. Coagulation Profile: No results for input(s): INR, PROTIME in the last 168 hours. Cardiac Enzymes: No results for input(s): CKTOTAL, CKMB, CKMBINDEX, TROPONINI in the last 168 hours. BNP (last 3 results) No results for input(s): PROBNP in the last 8760 hours. HbA1C: Recent Labs    11/21/23 1430  HGBA1C 9.5*   CBG: No results for input(s): GLUCAP in the last 168 hours. Lipid Profile: Recent Labs    11/22/23 0244  CHOL 98  HDL 31*  LDLCALC 53  TRIG 72  CHOLHDL 3.2   Thyroid  Function Tests: No results for input(s): TSH, T4TOTAL, FREET4, T3FREE, THYROIDAB in the last 72 hours. Anemia Panel: No results for input(s): VITAMINB12, FOLATE, FERRITIN, TIBC,  IRON, RETICCTPCT in the last 72 hours. Urine analysis: No results found for: COLORURINE, APPEARANCEUR, LABSPEC, PHURINE, GLUCOSEU, HGBUR, BILIRUBINUR, KETONESUR, PROTEINUR, UROBILINOGEN, NITRITE, LEUKOCYTESUR Sepsis Labs: @LABRCNTIP (procalcitonin:4,lacticidven:4)  )No results found for this or any previous visit (from the past 240 hours).   Radiology Studies: ECHOCARDIOGRAM COMPLETE Result Date: 11/21/2023    ECHOCARDIOGRAM REPORT   Patient Name:   Bruce [name] Date of Exam: 11/21/2023 Medical Rec #:  [id number]     Height:       65.0 in Accession #:    [id number]    Weight:       175.9 lb Date of Birth:  [birth year]    BSA:          1.873 m Patient Age:    88 years      BP:           137/68 mmHg Patient Gender: M             HR:           58 bpm. Exam Location:  Inpatient Procedure: 2D Echo, Cardiac Doppler and Color Doppler (Both Spectral and Color            Flow Doppler were utilized during procedure). Indications:    Chest Pain R07.9  History:        Patient has prior history of Echocardiogram examinations, most                 recent 06/18/2023. CKD, stage 3, Signs/Symptoms:Chest Pain; Risk                 Factors:Hypertension and Dyslipidemia.  Sonographer:    Thea Norlander RCS Referring Phys: MELVIN, ALEXANDER, B IMPRESSIONS  1. Left ventricular ejection fraction, by estimation, is 55 to 60%. The left ventricle has normal function. The left ventricle has no regional wall motion abnormalities. Left ventricular diastolic parameters were normal.  2. Right ventricular systolic function is normal. The right ventricular size is normal.  3. The mitral valve is normal in structure. Trivial mitral valve regurgitation. No evidence of mitral stenosis.  4. The aortic valve is tricuspid. Aortic valve regurgitation is not visualized. Aortic valve sclerosis/calcification is present, without any evidence of aortic stenosis. FINDINGS  Left Ventricle: Left ventricular ejection fraction, by  estimation, is 55 to 60%. The left ventricle has normal function. The left ventricle has no regional wall motion abnormalities. The left ventricular internal cavity size was normal in size. There is  no left ventricular hypertrophy. Left ventricular diastolic parameters were normal. Right Ventricle: The right ventricular size is normal. No increase in right ventricular wall thickness. Right ventricular systolic function is normal. Left Atrium: Left atrial size was normal in size. Right Atrium: Right atrial size was normal in size. Pericardium: There is no evidence  of pericardial effusion. Mitral Valve: The mitral valve is normal in structure. Trivial mitral valve regurgitation. No evidence of mitral valve stenosis. Tricuspid Valve: The tricuspid valve is normal in structure. Tricuspid valve regurgitation is trivial. Aortic Valve: The aortic valve is tricuspid. Aortic valve regurgitation is not visualized. Aortic valve sclerosis/calcification is present, without any evidence of aortic stenosis. Aortic valve peak gradient measures 7.2 mmHg. Pulmonic Valve: The pulmonic valve was not well visualized. Pulmonic valve regurgitation is not visualized. Aorta: The aortic root and ascending aorta are structurally normal, with no evidence of dilitation. IAS/Shunts: The interatrial septum was not well visualized.  LEFT VENTRICLE PLAX 2D LVIDd:         5.50 cm   Diastology LVIDs:         4.00 cm   LV e' medial:    7.94 cm/s LV PW:         0.80 cm   LV E/e' medial:  8.7 LV IVS:        0.90 cm   LV e' lateral:   11.30 cm/s LVOT diam:     2.40 cm   LV E/e' lateral: 6.1 LV SV:         109 LV SV Index:   58 LVOT Area:     4.52 cm  RIGHT VENTRICLE RV S prime:     13.80 cm/s TAPSE (M-mode): 3.0 cm LEFT ATRIUM             Index        RIGHT ATRIUM           Index LA diam:        3.80 cm 2.03 cm/m   RA Area:     13.20 cm LA Vol (A2C):   29.0 ml 15.48 ml/m  RA Volume:   20.60 ml  11.00 ml/m LA Vol (A4C):   30.8 ml 16.44 ml/m LA  Biplane Vol: 31.7 ml 16.92 ml/m  AORTIC VALVE AV Area (Vmax): 3.98 cm AV Vmax:        134.00 cm/s AV Peak Grad:   7.2 mmHg LVOT Vmax:      118.00 cm/s LVOT Vmean:     75.900 cm/s LVOT VTI:       0.242 m  AORTA Ao Root diam: 3.70 cm Ao Asc diam:  3.50 cm MITRAL VALVE                TRICUSPID VALVE MV Area (PHT): 2.73 cm     TR Peak grad:   21.2 mmHg MV Decel Time: 278 msec     TR Vmax:        230.00 cm/s MV E velocity: 69.20 cm/s MV A velocity: 107.00 cm/s  SHUNTS MV E/A ratio:  0.65         Systemic VTI:  0.24 m                             Systemic Diam: 2.40 cm Lonni Nanas MD Electronically signed by Lonni Nanas MD Signature Date/Time: 11/21/2023/7:25:24 PM    Final    DG Chest Portable 1 View Result Date: 11/21/2023 CLINICAL DATA:  Chest pain. Previous history of myocardial infarcts. EXAM: PORTABLE CHEST 1 VIEW COMPARISON:  05/13/2023 FINDINGS: Cardiac enlargement. Mild vascular congestion. Interstitial changes in the lung bases may represent early edema. No focal consolidation. No pleural effusion or pneumothorax. Mild bronchiectasis with peribronchial thickening suggesting chronic bronchitis. Calcified and tortuous aorta. Degenerative changes  in the spine and shoulders. IMPRESSION: 1. Cardiac enlargement with probable early interstitial edema in the lung bases. No focal consolidation. 2. Chronic bronchitic changes in the lungs. Electronically Signed   By: Elsie Gravely M.D.   On: 11/21/2023 03:21     Scheduled Meds:  aspirin  EC  81 mg Oral Daily   atorvastatin   80 mg Oral QHS   furosemide   40 mg Intravenous Once   heparin   5,000 Units Subcutaneous Q8H   pantoprazole   40 mg Oral Daily   Continuous Infusions:   LOS: 0 days    Time spent:    Sigurd Pac, MD [facility]   11/22/2023, 11:18 AM

## 2023-11-22 NOTE — TOC Transition Note (Signed)
 Transition of Care [facility]) - Discharge Note   Patient Details  Name: Bruce [name] MRN: [id number] Date of Birth: [birth year]  Transition of Care [facility]) CM/SW Contact:  Waddell Barnie Rama, RN Phone Number: 11/22/2023, 12:01 PM   Clinical Narrative:     For dc today, wife and daughter will transport him home today.        Patient Goals and CMS Choice            Discharge Placement                       Discharge Plan and Services Additional resources added to the After Visit Summary for                                       Social Drivers of Health (SDOH) Interventions SDOH Screenings   Food Insecurity: No Food Insecurity (11/22/2023)  Housing: Low Risk  (11/22/2023)  Transportation Needs: No Transportation Needs (11/22/2023)  Utilities: Not At Risk (11/22/2023)  Social Connections: Socially Isolated (11/22/2023)  Tobacco Use: Low Risk  (11/21/2023)     Readmission Risk Interventions     No data to display

## 2023-11-22 NOTE — Progress Notes (Signed)
 Patient and spouse has been given discharge instructions and verbalize understanding of instructions.

## 2023-11-22 NOTE — Telephone Encounter (Signed)
-----   Message from [place] H sent at 11/22/2023  2:15 PM EDT ----- Regarding: RE: cardiac PET CT Auth is on file. ----- Message ----- From: Gladis Porter HERO, LPN Sent: 06/19/7972   1:21 PM EDT To: Alan Pho; Brittany L Lynch; Katelyn J C# Subject: FW: cardiac PET CT                             Can you please help with getting this scheduled?  Thanks, Triage ----- Message ----- From: Trudy [place], DEVONNA Sent: 11/22/2023  12:55 PM EDT To: [facility] [place] Triage Subject: cardiac PET CT                                 Cardiac PET stress test order placed per Dr. Lonni. Results will go to Dr. Jeffrie. Please schedule this study. Thanks!  [place] Trudy, PA-C

## 2023-11-22 NOTE — TOC CM/SW Note (Signed)
 Transition of Care [facility]) - Inpatient Brief Assessment   Patient Details  Name: Bruce [name] MRN: [id number] Date of Birth: May 24, 1935  Transition of Care [facility]) CM/SW Contact:    Waddell Barnie Rama, RN Phone Number: 11/22/2023, 12:00 PM   Clinical Narrative: From home with spouse, has PCP and insurance on file, states has no HH services in place at this time or DME at home.  States family member will transport them home at [employer] and family is support system, states gets medications from [facility] on [address] and [address] order.  Pta self ambulatory.    Transition of Care Asessment: Insurance and Status: Insurance coverage has been reviewed Patient has primary care physician: Yes Home environment has been reviewed: home with wife Prior level of function:: indep Prior/Current Home Services: No current home services Social Drivers of Health Review: SDOH reviewed no interventions necessary Readmission risk has been reviewed: Yes Transition of care needs: no transition of care needs at this time

## 2023-11-23 ENCOUNTER — Telehealth (HOSPITAL_COMMUNITY): Payer: Self-pay | Admitting: Emergency Medicine

## 2023-11-23 NOTE — Telephone Encounter (Signed)
 Reaching out to patient to offer assistance regarding upcoming cardiac imaging study; pt verbalizes understanding of appt date/time, parking situation and where to check in, pre-test NPO status and medications ordered, and verified current allergies; name and call back number provided for further questions should they arise Rockwell Alexandria RN Navigator Cardiac Imaging [facility] [phone] office [phone] cell

## 2023-11-24 ENCOUNTER — Ambulatory Visit (HOSPITAL_COMMUNITY)
Admission: RE | Admit: 2023-11-24 | Discharge: 2023-11-24 | Disposition: A | Discharge status: Home / Self Care | Source: Ambulatory Visit | Attending: Cardiology | Admitting: Cardiology

## 2023-11-24 DIAGNOSIS — R072 Precordial pain: Secondary | ICD-10-CM | POA: Insufficient documentation

## 2023-11-24 MED ORDER — RUBIDIUM RB82 GENERATOR (RUBYFILL)
21.4800 | PACK | Freq: Once | INTRAVENOUS | Status: AC
  Administered 2023-11-24: 20.97 via INTRAVENOUS

## 2023-11-24 MED ORDER — REGADENOSON 0.4 MG/5ML IV SOLN
0.4000 mg | Freq: Once | INTRAVENOUS | Status: AC
  Administered 2023-11-24: 0.4 mg via INTRAVENOUS

## 2023-11-24 MED ORDER — RUBIDIUM RB82 GENERATOR (RUBYFILL)
21.4800 | PACK | Freq: Once | INTRAVENOUS | Status: AC
  Administered 2023-11-24: 21.43 via INTRAVENOUS

## 2023-11-24 MED ORDER — REGADENOSON 0.4 MG/5ML IV SOLN
INTRAVENOUS | Status: AC
  Filled 2023-11-24: qty 5

## 2023-11-25 DIAGNOSIS — I10 Essential (primary) hypertension: Secondary | ICD-10-CM | POA: Diagnosis not present

## 2023-11-25 DIAGNOSIS — I700 Atherosclerosis of aorta: Secondary | ICD-10-CM | POA: Diagnosis not present

## 2023-11-25 DIAGNOSIS — K219 Gastro-esophageal reflux disease without esophagitis: Secondary | ICD-10-CM | POA: Diagnosis not present

## 2023-11-25 DIAGNOSIS — E1169 Type 2 diabetes mellitus with other specified complication: Secondary | ICD-10-CM | POA: Diagnosis not present

## 2023-11-25 DIAGNOSIS — N1831 Chronic kidney disease, stage 3a: Secondary | ICD-10-CM | POA: Diagnosis not present

## 2023-11-25 DIAGNOSIS — I25119 Atherosclerotic heart disease of native coronary artery with unspecified angina pectoris: Secondary | ICD-10-CM | POA: Diagnosis not present

## 2023-11-25 DIAGNOSIS — E7800 Pure hypercholesterolemia, unspecified: Secondary | ICD-10-CM | POA: Diagnosis not present

## 2023-11-25 DIAGNOSIS — G629 Polyneuropathy, unspecified: Secondary | ICD-10-CM | POA: Diagnosis not present

## 2023-11-25 LAB — NM PET CT CARDIAC PERFUSION MULTI W/ABSOLUTE BLOODFLOW
Nuc Rest EF: 48 %
Nuc Stress EF: 50 %
Rest Nuclear Isotope Dose: 21 mCi
Stress Nuclear Isotope Dose: 21.4 mCi
TID: 1.01

## 2023-11-29 ENCOUNTER — Encounter (HOSPITAL_BASED_OUTPATIENT_CLINIC_OR_DEPARTMENT_OTHER): Admitting: Internal Medicine

## 2023-11-30 ENCOUNTER — Ambulatory Visit: Payer: Self-pay | Admitting: Cardiology

## 2023-12-03 DIAGNOSIS — R7303 Prediabetes: Secondary | ICD-10-CM | POA: Diagnosis not present

## 2023-12-03 DIAGNOSIS — K219 Gastro-esophageal reflux disease without esophagitis: Secondary | ICD-10-CM | POA: Diagnosis not present

## 2023-12-03 DIAGNOSIS — I209 Angina pectoris, unspecified: Secondary | ICD-10-CM | POA: Diagnosis not present

## 2023-12-03 DIAGNOSIS — N1831 Chronic kidney disease, stage 3a: Secondary | ICD-10-CM | POA: Diagnosis not present

## 2023-12-03 DIAGNOSIS — Z09 Encounter for follow-up examination after completed treatment for conditions other than malignant neoplasm: Secondary | ICD-10-CM | POA: Diagnosis not present

## 2023-12-03 DIAGNOSIS — I10 Essential (primary) hypertension: Secondary | ICD-10-CM | POA: Diagnosis not present

## 2023-12-03 DIAGNOSIS — E7800 Pure hypercholesterolemia, unspecified: Secondary | ICD-10-CM | POA: Diagnosis not present

## 2023-12-04 NOTE — Progress Notes (Unsigned)
 Cardiology Office Note   Date:  12/07/2023  ID:  Saban, Heinlen 10-Oct-1935, MRN [id number] PCP: Marvene Prentice [name], FNP  [facility] Providers Cardiologist:  Oneil Parchment, MD {  History of Present Illness Bruce [name] is a 88 y.o. male with  a past medical history of CAD status post PCI/DES to RCA, mild obstructive disease), HTN, HLD, diastolic dysfunction, OSA on CPAP, CKD stage III, renal carcinoma who presents today for follow-up appointment.  He was last seen 12/05/2022.  History includes significant coronary disease with MI and PCI to the RCA in 2020, moderate LAD stenosis.  Left heart cath due to chest pain in 2013 that showed patent stents.  Stress test that showed no ischemia but small inferior infarct pattern.  He had an additional stress test completed 10/2017 that showed no ischemia.  Patient's last TTE was completed 04/2021 with LVEF 55 to 60%.  No RWMA. In November of last year and was doing well at that time.  Noted to have stable blood pressure and no changes were made to his medications.  He was seen in the ED 05/13/2023 with complaint of chest pain.  Precordial pain occurred while walking around his room.  He admits.  Also noted pain on inspiration that resolved with nitroglycerin .  Patient's troponins are negative and EKG showed no ACS.  Consulted by cardiology who recommended Imdur  15 mg and outpatient follow-up.  Patient's chest discomfort had not completely resolved with the addition of Coreg.  Patient also reported feeling tired and washed out with lack of energy activities.  PT keeping ongoing for couple months had been particularly bothersome Saturday before Christmas.  Patient felt like he was going to pass out.  He denied shortness of breath, lower extremity edema, palpitations, melena, hemoptysis, diaphoresis, weakness, presyncope/syncope, orthopnea, and PND.  Today, he presents with hypertension and coronary artery disease who presents with fatigue and low energy.  He is accompanied by his wife.  He experiences significant fatigue and low energy for a couple of years, becoming easily exhausted after minimal physical activity. He attributes this to his heart not pumping fully and low blood pressure. He has no significant shortness of breath unless physically active and no palpitations or irregular heartbeats.  He has hypertension, managed with lisinopril , and coronary artery disease, for which he takes isosorbide  30 mg for chest discomfort. He had a heart attack in 2002 with a stent placement. A recent PET scan shows an ejection fraction of 48-50%.  He experiences occasional ankle swelling, particularly when active, which resolves overnight. His weight is stable. He uses a CPAP device for sleep apnea, getting seven to eight hours of sleep per night.  He has a family history of heart disease, which led to a nuclear stress test revealing a silent heart attack. He is on Lipitor , maintaining his LDL cholesterol at 53.  Reports no shortness of breath nor dyspnea on exertion. Reports no chest pain, pressure, or tightness. No edema, orthopnea, PND. Reports no palpitations.   Discussed the use of AI scribe software for clinical note transcription with the patient, who gave verbal consent to proceed.   ROS: pertinent ROS in HPI  Studies Reviewed     NM PET/CT cardiac 11/24/2023  Fixed perfusion defect in mid to basal inferior/inferolateral wall, with hypokinesis in this area, corresponding to prior infarct.  Mild systolic dysfunction (LVEF 48% at rest, 50% with stress).  Myocardial blood flow reserve is mildly reduced (1.79), though can be inaccurate in setting  of prior coronary stenting.  No high risk findings such as TID or drop in EF with stress.  Overall, study suggests prior inferior infarct and is low risk.   LV perfusion is abnormal. There is no evidence of ischemia. There is evidence of infarction. Defect 1: There is a medium defect with moderate reduction in  uptake present in the mid to basal inferior and inferolateral location(s) that is fixed. There is abnormal wall motion in the defect area. Consistent with infarction. The defect is consistent with abnormal perfusion in the RCA territory.   Rest left ventricular function is abnormal. Rest global function is mildly reduced. Rest EF: 48%. Stress left ventricular function is abnormal. Stress global function is mildly reduced. Stress EF: 50%. End diastolic cavity size is normal. End systolic cavity size is normal.   Myocardial blood flow reserve is not reported in this patient due to technical or patient-specific concerns that affect accuracy.   Coronary calcium  assessment not performed due to prior revascularization.   Findings are consistent with infarction. The study is low risk.   Electronically signed by Lonni Nanas, MD   Physical Exam VS:  BP (!) 110/40   Pulse 68   Ht 5' 5 (1.651 m)   Wt 178 lb 9.6 oz (81 kg)   SpO2 98%   BMI 29.72 kg/m        Wt Readings from Last 3 Encounters:  12/07/23 178 lb 9.6 oz (81 kg)  11/21/23 175 lb 14.8 oz (79.8 kg)  06/21/23 180 lb (81.6 kg)    GEN: Well nourished, well developed in no acute distress NECK: No JVD; No carotid bruits CARDIAC: RRR, no murmurs, rubs, gallops RESPIRATORY:  Clear to auscultation without rales, wheezing or rhonchi  ABDOMEN: Soft, non-tender, non-distended EXTREMITIES:  No edema; No deformity   ASSESSMENT AND PLAN Coronary artery disease with history of myocardial infarction PET scan shows mild hypokinesis and slightly below normal ejection fraction. Recent stress test and PET CT indicate low risk with no new defects. LDL cholesterol well-controlled. - Continue current cholesterol management with Lipitor .  Heart failure BNP levels at 127 indicate well-managed heart failure. PET scan shows mild hypokinesis with ejection fraction at 48-50%. - Advise on wearing compression stockings if standing for long  periods.  Hypertension Blood pressure low, contributing to fatigue. Lisinopril  may be contributing to low blood pressure. - Reduce lisinopril  dose from 20 mg to 10 mg. - Monitor blood pressure at home one to two times daily for two weeks. - Refill all cardiac medications.  Fatigue Chronic fatigue possibly related to low blood pressure, mild heart failure, or renal carcinoma. Hemoglobin slightly low, thyroid  function not recently checked. - Order TSH to evaluate thyroid  function. - Consider iron supplementation, with caution for constipation.  Sleep apnea Managed with CPAP, good compliance reported. - Continue CPAP use.  Renal carcinoma Under surveillance with annual testing. Minimal growth noted, no intervention required. - Continue annual surveillance for renal carcinoma.     Dispo: He can follow-up in 6 months with Dr. Jeffrie  Signed, Orren LOISE Fabry, PA-C

## 2023-12-07 ENCOUNTER — Ambulatory Visit: Admitting: Nurse Practitioner

## 2023-12-07 ENCOUNTER — Ambulatory Visit: Attending: Physician Assistant | Admitting: Physician Assistant

## 2023-12-07 ENCOUNTER — Encounter: Payer: Self-pay | Admitting: Physician Assistant

## 2023-12-07 VITALS — BP 110/40 | HR 68 | Ht 65.0 in | Wt 178.6 lb

## 2023-12-07 DIAGNOSIS — R5383 Other fatigue: Secondary | ICD-10-CM | POA: Diagnosis not present

## 2023-12-07 DIAGNOSIS — R079 Chest pain, unspecified: Secondary | ICD-10-CM

## 2023-12-07 DIAGNOSIS — R072 Precordial pain: Secondary | ICD-10-CM | POA: Diagnosis not present

## 2023-12-07 DIAGNOSIS — I10 Essential (primary) hypertension: Secondary | ICD-10-CM

## 2023-12-07 DIAGNOSIS — E785 Hyperlipidemia, unspecified: Secondary | ICD-10-CM | POA: Diagnosis not present

## 2023-12-07 DIAGNOSIS — I2510 Atherosclerotic heart disease of native coronary artery without angina pectoris: Secondary | ICD-10-CM

## 2023-12-07 DIAGNOSIS — G4733 Obstructive sleep apnea (adult) (pediatric): Secondary | ICD-10-CM | POA: Diagnosis not present

## 2023-12-07 MED ORDER — AMLODIPINE BESYLATE 10 MG PO TABS: 10.0000 mg | ORAL_TABLET | Freq: Every day | ORAL | 2 refills | Status: AC

## 2023-12-07 MED ORDER — LISINOPRIL 10 MG PO TABS: 10.0000 mg | ORAL_TABLET | Freq: Every day | ORAL | 2 refills | Status: AC

## 2023-12-07 MED ORDER — ATORVASTATIN CALCIUM 80 MG PO TABS: 80.0000 mg | ORAL_TABLET | Freq: Every day | ORAL | 2 refills | Status: AC

## 2023-12-07 MED ORDER — DOXAZOSIN MESYLATE 8 MG PO TABS: 8.0000 mg | ORAL_TABLET | Freq: Every morning | ORAL | 2 refills | Status: AC

## 2023-12-07 MED ORDER — LISINOPRIL 10 MG PO TABS: 10.0000 mg | ORAL_TABLET | Freq: Every day | ORAL | 2 refills | Status: DC

## 2023-12-07 NOTE — Patient Instructions (Signed)
 Medication Instructions:   DECREASE YOUR LISINOPRIL  TO 10 MG BY MOUTH DAILY  *If you need a refill on your cardiac medications before your next appointment, please call your pharmacy*   Lab Work:  TODAY--TSH [employer] FIRST FLOOR AT [facility]  If you have labs (blood work) drawn today and your tests are completely normal, you will receive your results only by: MyChart Message (if you have MyChart) OR A paper copy in the mail If you have any lab test that is abnormal or we need to change your treatment, we will call you to review the results.    Follow-Up: At [facility], you and your health needs are our priority.  As part of our continuing mission to provide you with exceptional heart care, our providers are all part of one team.  This team includes your primary Cardiologist (physician) and Advanced Practice Providers or APPs (Physician Assistants and Nurse Practitioners) who all work together to provide you with the care you need, when you need it.  Your next appointment:   6 month(s)  Provider:   Oneil Parchment, MD

## 2023-12-08 ENCOUNTER — Ambulatory Visit: Payer: Self-pay | Admitting: *Deleted

## 2023-12-08 LAB — TSH: TSH: 2.27 u[IU]/mL (ref 0.450–4.500)

## 2023-12-10 DIAGNOSIS — J479 Bronchiectasis, uncomplicated: Secondary | ICD-10-CM | POA: Diagnosis not present

## 2023-12-10 DIAGNOSIS — N1831 Chronic kidney disease, stage 3a: Secondary | ICD-10-CM | POA: Diagnosis not present

## 2023-12-10 DIAGNOSIS — I25119 Atherosclerotic heart disease of native coronary artery with unspecified angina pectoris: Secondary | ICD-10-CM | POA: Diagnosis not present

## 2023-12-10 DIAGNOSIS — I10 Essential (primary) hypertension: Secondary | ICD-10-CM | POA: Diagnosis not present

## 2023-12-16 ENCOUNTER — Other Ambulatory Visit: Payer: Self-pay

## 2023-12-16 DIAGNOSIS — N1831 Chronic kidney disease, stage 3a: Secondary | ICD-10-CM | POA: Diagnosis not present

## 2023-12-16 DIAGNOSIS — N400 Enlarged prostate without lower urinary tract symptoms: Secondary | ICD-10-CM | POA: Diagnosis not present

## 2023-12-16 DIAGNOSIS — E7800 Pure hypercholesterolemia, unspecified: Secondary | ICD-10-CM | POA: Diagnosis not present

## 2023-12-16 DIAGNOSIS — J479 Bronchiectasis, uncomplicated: Secondary | ICD-10-CM | POA: Diagnosis not present

## 2023-12-16 DIAGNOSIS — I10 Essential (primary) hypertension: Secondary | ICD-10-CM | POA: Diagnosis not present

## 2023-12-16 DIAGNOSIS — I25119 Atherosclerotic heart disease of native coronary artery with unspecified angina pectoris: Secondary | ICD-10-CM | POA: Diagnosis not present

## 2023-12-16 MED ORDER — ISOSORBIDE MONONITRATE ER 30 MG PO TB24: 30.0000 mg | ORAL_TABLET | Freq: Every day | ORAL | 3 refills | Status: DC

## 2023-12-16 NOTE — Telephone Encounter (Signed)
 RX sent to requested Pharmacy

## 2023-12-21 ENCOUNTER — Telehealth: Payer: Self-pay | Admitting: Cardiology

## 2023-12-21 NOTE — Telephone Encounter (Signed)
 Pt c/o medication issue:  1. Name of Medication: isosorbide  mononitrate (IMDUR ) 30 MG 24 hr tablet   2. How are you currently taking this medication (dosage and times per day)?    3. Are you having a reaction (difficulty breathing--STAT)? no  4. What is your medication issue? Pharmacy ([facility]) states the prescription is not written correctly for them to be able to filled. Please advise

## 2023-12-22 MED ORDER — ISOSORBIDE MONONITRATE ER 30 MG PO TB24: 30.0000 mg | ORAL_TABLET | Freq: Every day | ORAL | 0 refills | Status: DC

## 2023-12-22 MED ORDER — ISOSORBIDE MONONITRATE ER 30 MG PO TB24: 30.0000 mg | ORAL_TABLET | Freq: Every day | ORAL | 3 refills | Status: AC

## 2023-12-22 NOTE — Telephone Encounter (Signed)
 Received reply from Orren Fabry, PA-C regarding dosage of Imdur :  oh okay yes, no problem lets do one pill daily we can check in with him at his next appointment!   Imdur  30 mg daily sent to [facility] per patient's request.

## 2023-12-22 NOTE — Telephone Encounter (Signed)
 Spoke with patient and verified what dose of Imdur  he is taking: patient states he is taking 30 mg of Imdur  daily.  Imdur  was restarted during hospitalization in July, with recommendation from Artist Pouch, PA-C: Recommend restarting Imdur  at 30mg  daily dose with outpatient follow up.   Patient has only 1 tablet left and needs refill sent to [facility], in addition to a 1 week supply to [facility] to cover him until his regular Rx can be delivered.  Will send to provider to confirm dosage change OK.

## 2023-12-22 NOTE — Addendum Note (Signed)
 Addended by: VICCI ROXIE CROME on: 12/22/2023 04:35 PM   Modules accepted: Orders

## 2023-12-22 NOTE — Telephone Encounter (Signed)
 Pt daughter calling in about this prescription. Pt will be out today. Can someone send this over to [facility] mail order and [facility] for [facility] week supply so he has enough until mail order is delivered.

## 2024-01-09 DIAGNOSIS — I10 Essential (primary) hypertension: Secondary | ICD-10-CM | POA: Diagnosis not present

## 2024-01-09 DIAGNOSIS — N1831 Chronic kidney disease, stage 3a: Secondary | ICD-10-CM | POA: Diagnosis not present

## 2024-01-09 DIAGNOSIS — I25119 Atherosclerotic heart disease of native coronary artery with unspecified angina pectoris: Secondary | ICD-10-CM | POA: Diagnosis not present

## 2024-01-09 DIAGNOSIS — J479 Bronchiectasis, uncomplicated: Secondary | ICD-10-CM | POA: Diagnosis not present

## 2024-01-16 DIAGNOSIS — I25119 Atherosclerotic heart disease of native coronary artery with unspecified angina pectoris: Secondary | ICD-10-CM | POA: Diagnosis not present

## 2024-01-16 DIAGNOSIS — N400 Enlarged prostate without lower urinary tract symptoms: Secondary | ICD-10-CM | POA: Diagnosis not present

## 2024-01-16 DIAGNOSIS — I10 Essential (primary) hypertension: Secondary | ICD-10-CM | POA: Diagnosis not present

## 2024-01-16 DIAGNOSIS — E7800 Pure hypercholesterolemia, unspecified: Secondary | ICD-10-CM | POA: Diagnosis not present

## 2024-01-16 DIAGNOSIS — J479 Bronchiectasis, uncomplicated: Secondary | ICD-10-CM | POA: Diagnosis not present

## 2024-01-16 DIAGNOSIS — N1831 Chronic kidney disease, stage 3a: Secondary | ICD-10-CM | POA: Diagnosis not present

## 2024-02-08 DIAGNOSIS — N1831 Chronic kidney disease, stage 3a: Secondary | ICD-10-CM | POA: Diagnosis not present

## 2024-02-08 DIAGNOSIS — J479 Bronchiectasis, uncomplicated: Secondary | ICD-10-CM | POA: Diagnosis not present

## 2024-02-08 DIAGNOSIS — I10 Essential (primary) hypertension: Secondary | ICD-10-CM | POA: Diagnosis not present

## 2024-02-08 DIAGNOSIS — I25119 Atherosclerotic heart disease of native coronary artery with unspecified angina pectoris: Secondary | ICD-10-CM | POA: Diagnosis not present

## 2024-02-15 DIAGNOSIS — N1831 Chronic kidney disease, stage 3a: Secondary | ICD-10-CM | POA: Diagnosis not present

## 2024-02-15 DIAGNOSIS — N400 Enlarged prostate without lower urinary tract symptoms: Secondary | ICD-10-CM | POA: Diagnosis not present

## 2024-02-15 DIAGNOSIS — I25119 Atherosclerotic heart disease of native coronary artery with unspecified angina pectoris: Secondary | ICD-10-CM | POA: Diagnosis not present

## 2024-02-15 DIAGNOSIS — I10 Essential (primary) hypertension: Secondary | ICD-10-CM | POA: Diagnosis not present

## 2024-02-15 DIAGNOSIS — E7800 Pure hypercholesterolemia, unspecified: Secondary | ICD-10-CM | POA: Diagnosis not present

## 2024-02-15 DIAGNOSIS — J479 Bronchiectasis, uncomplicated: Secondary | ICD-10-CM | POA: Diagnosis not present

## 2024-02-21 DIAGNOSIS — N1831 Chronic kidney disease, stage 3a: Secondary | ICD-10-CM | POA: Diagnosis not present

## 2024-02-29 DIAGNOSIS — D3000 Benign neoplasm of unspecified kidney: Secondary | ICD-10-CM | POA: Diagnosis not present

## 2024-03-02 DIAGNOSIS — N281 Cyst of kidney, acquired: Secondary | ICD-10-CM | POA: Diagnosis not present

## 2024-03-02 DIAGNOSIS — D631 Anemia in chronic kidney disease: Secondary | ICD-10-CM | POA: Diagnosis not present

## 2024-03-02 DIAGNOSIS — N1831 Chronic kidney disease, stage 3a: Secondary | ICD-10-CM | POA: Diagnosis not present

## 2024-03-02 DIAGNOSIS — I129 Hypertensive chronic kidney disease with stage 1 through stage 4 chronic kidney disease, or unspecified chronic kidney disease: Secondary | ICD-10-CM | POA: Diagnosis not present

## 2024-03-02 DIAGNOSIS — N2889 Other specified disorders of kidney and ureter: Secondary | ICD-10-CM | POA: Diagnosis not present

## 2024-03-06 DIAGNOSIS — D3002 Benign neoplasm of left kidney: Secondary | ICD-10-CM | POA: Diagnosis not present

## 2024-03-06 DIAGNOSIS — D3000 Benign neoplasm of unspecified kidney: Secondary | ICD-10-CM | POA: Diagnosis not present

## 2024-03-06 DIAGNOSIS — N200 Calculus of kidney: Secondary | ICD-10-CM | POA: Diagnosis not present

## 2024-03-06 DIAGNOSIS — N289 Disorder of kidney and ureter, unspecified: Secondary | ICD-10-CM | POA: Diagnosis not present

## 2024-03-09 DIAGNOSIS — I10 Essential (primary) hypertension: Secondary | ICD-10-CM | POA: Diagnosis not present

## 2024-03-09 DIAGNOSIS — N1831 Chronic kidney disease, stage 3a: Secondary | ICD-10-CM | POA: Diagnosis not present

## 2024-03-09 DIAGNOSIS — J479 Bronchiectasis, uncomplicated: Secondary | ICD-10-CM | POA: Diagnosis not present

## 2024-03-09 DIAGNOSIS — I25119 Atherosclerotic heart disease of native coronary artery with unspecified angina pectoris: Secondary | ICD-10-CM | POA: Diagnosis not present

## 2024-03-17 DIAGNOSIS — I10 Essential (primary) hypertension: Secondary | ICD-10-CM | POA: Diagnosis not present

## 2024-03-17 DIAGNOSIS — I25119 Atherosclerotic heart disease of native coronary artery with unspecified angina pectoris: Secondary | ICD-10-CM | POA: Diagnosis not present

## 2024-03-17 DIAGNOSIS — N1831 Chronic kidney disease, stage 3a: Secondary | ICD-10-CM | POA: Diagnosis not present

## 2024-03-17 DIAGNOSIS — J479 Bronchiectasis, uncomplicated: Secondary | ICD-10-CM | POA: Diagnosis not present

## 2024-03-17 DIAGNOSIS — N400 Enlarged prostate without lower urinary tract symptoms: Secondary | ICD-10-CM | POA: Diagnosis not present

## 2024-03-17 DIAGNOSIS — E7800 Pure hypercholesterolemia, unspecified: Secondary | ICD-10-CM | POA: Diagnosis not present

## 2024-03-23 DIAGNOSIS — R399 Unspecified symptoms and signs involving the genitourinary system: Secondary | ICD-10-CM | POA: Diagnosis not present

## 2024-03-23 DIAGNOSIS — D49512 Neoplasm of unspecified behavior of left kidney: Secondary | ICD-10-CM | POA: Diagnosis not present

## 2024-04-08 DIAGNOSIS — N1831 Chronic kidney disease, stage 3a: Secondary | ICD-10-CM | POA: Diagnosis not present

## 2024-04-08 DIAGNOSIS — I10 Essential (primary) hypertension: Secondary | ICD-10-CM | POA: Diagnosis not present

## 2024-04-08 DIAGNOSIS — J479 Bronchiectasis, uncomplicated: Secondary | ICD-10-CM | POA: Diagnosis not present

## 2024-04-08 DIAGNOSIS — I25119 Atherosclerotic heart disease of native coronary artery with unspecified angina pectoris: Secondary | ICD-10-CM | POA: Diagnosis not present

## 2024-04-16 DIAGNOSIS — I25119 Atherosclerotic heart disease of native coronary artery with unspecified angina pectoris: Secondary | ICD-10-CM | POA: Diagnosis not present

## 2024-04-16 DIAGNOSIS — N1831 Chronic kidney disease, stage 3a: Secondary | ICD-10-CM | POA: Diagnosis not present

## 2024-04-16 DIAGNOSIS — N400 Enlarged prostate without lower urinary tract symptoms: Secondary | ICD-10-CM | POA: Diagnosis not present

## 2024-04-16 DIAGNOSIS — E7800 Pure hypercholesterolemia, unspecified: Secondary | ICD-10-CM | POA: Diagnosis not present

## 2024-04-26 DIAGNOSIS — N529 Male erectile dysfunction, unspecified: Secondary | ICD-10-CM | POA: Diagnosis not present

## 2024-04-26 DIAGNOSIS — I10 Essential (primary) hypertension: Secondary | ICD-10-CM | POA: Diagnosis not present

## 2024-04-26 DIAGNOSIS — J301 Allergic rhinitis due to pollen: Secondary | ICD-10-CM | POA: Diagnosis not present

## 2024-04-26 DIAGNOSIS — K219 Gastro-esophageal reflux disease without esophagitis: Secondary | ICD-10-CM | POA: Diagnosis not present

## 2024-04-26 DIAGNOSIS — E7800 Pure hypercholesterolemia, unspecified: Secondary | ICD-10-CM | POA: Diagnosis not present

## 2024-04-26 DIAGNOSIS — I25119 Atherosclerotic heart disease of native coronary artery with unspecified angina pectoris: Secondary | ICD-10-CM | POA: Diagnosis not present

## 2024-04-26 DIAGNOSIS — G629 Polyneuropathy, unspecified: Secondary | ICD-10-CM | POA: Diagnosis not present

## 2024-04-26 DIAGNOSIS — R7303 Prediabetes: Secondary | ICD-10-CM | POA: Diagnosis not present

## 2024-04-28 ENCOUNTER — Encounter: Payer: Self-pay | Admitting: Cardiology

## 2024-06-16 ENCOUNTER — Other Ambulatory Visit: Payer: Self-pay | Admitting: Neurological Surgery

## 2024-06-20 ENCOUNTER — Telehealth: Payer: Self-pay | Admitting: Cardiology

## 2024-06-20 NOTE — Telephone Encounter (Signed)
"   Left detail message on voicemail .  The message  will be sent to [facility] pharmacy team  for review and comment.   Daughter did not mention in the note what patient is taking steroid - orally or by injection    Will contact once receive an answer "

## 2024-06-20 NOTE — Telephone Encounter (Signed)
 Daughter calling to speak with the nurse, to see if its okay for the patient to be on a steroid. Please advise

## 2024-06-22 NOTE — Telephone Encounter (Signed)
 Called pt, relayed message. He was thankful for the call. No other questions expressed at this time.

## 2024-06-22 NOTE — Telephone Encounter (Signed)
 There is generally less side effects with injection due to less absorption into the blood. Ok to try injection

## 2024-06-22 NOTE — Telephone Encounter (Signed)
 Per daughter - pt is needing a one time injectable steroid for back pain to determine if beneficial. He previously developed a low heart rate when taking oral prednisone  as documented in allergies/contraindications.  Advised will forward for review and call back with further recommendations.

## 2024-07-05 ENCOUNTER — Ambulatory Visit: Admitting: Emergency Medicine

## 2024-10-13 ENCOUNTER — Encounter (INDEPENDENT_AMBULATORY_CARE_PROVIDER_SITE_OTHER): Admitting: Ophthalmology
